# Patient Record
Sex: Male | Born: 1965 | Race: Black or African American | Hispanic: No | Marital: Single | Smoking: Current every day smoker
Health system: Southern US, Community
[De-identification: ages and names within clinical notes are randomized; demographics above are authoritative.]

## PROBLEM LIST (undated history)

## (undated) ENCOUNTER — Emergency Department (HOSPITAL_COMMUNITY): Discharge status: Against Medical Advice | Payer: Medicaid Other

## (undated) DIAGNOSIS — I10 Essential (primary) hypertension: Secondary | ICD-10-CM

## (undated) DIAGNOSIS — F329 Major depressive disorder, single episode, unspecified: Secondary | ICD-10-CM

## (undated) DIAGNOSIS — M549 Dorsalgia, unspecified: Secondary | ICD-10-CM

## (undated) DIAGNOSIS — Q211 Atrial septal defect: Secondary | ICD-10-CM

## (undated) DIAGNOSIS — F419 Anxiety disorder, unspecified: Secondary | ICD-10-CM

## (undated) DIAGNOSIS — K219 Gastro-esophageal reflux disease without esophagitis: Secondary | ICD-10-CM

## (undated) DIAGNOSIS — F32A Depression, unspecified: Secondary | ICD-10-CM

## (undated) DIAGNOSIS — I639 Cerebral infarction, unspecified: Secondary | ICD-10-CM

## (undated) DIAGNOSIS — Q2112 Patent foramen ovale: Secondary | ICD-10-CM

## (undated) HISTORY — DX: Major depressive disorder, single episode, unspecified: F32.9

## (undated) HISTORY — DX: Anxiety disorder, unspecified: F41.9

## (undated) HISTORY — DX: Gastro-esophageal reflux disease without esophagitis: K21.9

## (undated) HISTORY — DX: Depression, unspecified: F32.A

---

## 1989-07-31 HISTORY — PX: HAND SURGERY: SHX662

## 1998-01-15 ENCOUNTER — Emergency Department (HOSPITAL_COMMUNITY): Admission: EM | Admit: 1998-01-15 | Discharge: 1998-01-15 | Payer: Self-pay | Admitting: Emergency Medicine

## 1999-01-30 ENCOUNTER — Emergency Department (HOSPITAL_COMMUNITY): Admission: EM | Admit: 1999-01-30 | Discharge: 1999-01-31 | Payer: Self-pay | Admitting: *Deleted

## 2000-06-04 ENCOUNTER — Encounter: Payer: Self-pay | Admitting: Emergency Medicine

## 2000-06-04 ENCOUNTER — Emergency Department (HOSPITAL_COMMUNITY): Admission: EM | Admit: 2000-06-04 | Discharge: 2000-06-05 | Payer: Self-pay | Admitting: Emergency Medicine

## 2006-05-24 ENCOUNTER — Emergency Department (HOSPITAL_COMMUNITY): Admission: EM | Admit: 2006-05-24 | Discharge: 2006-05-24 | Payer: Self-pay | Admitting: Pediatrics

## 2006-05-27 ENCOUNTER — Emergency Department (HOSPITAL_COMMUNITY): Admission: EM | Admit: 2006-05-27 | Discharge: 2006-05-27 | Payer: Self-pay | Admitting: Emergency Medicine

## 2007-05-12 ENCOUNTER — Emergency Department (HOSPITAL_COMMUNITY): Admission: EM | Admit: 2007-05-12 | Discharge: 2007-05-13 | Payer: Self-pay | Admitting: Emergency Medicine

## 2008-12-14 ENCOUNTER — Emergency Department (HOSPITAL_COMMUNITY): Admission: EM | Admit: 2008-12-14 | Discharge: 2008-12-14 | Payer: Self-pay | Admitting: Emergency Medicine

## 2008-12-15 ENCOUNTER — Ambulatory Visit: Payer: Self-pay | Admitting: *Deleted

## 2009-07-23 ENCOUNTER — Emergency Department (HOSPITAL_COMMUNITY): Admission: EM | Admit: 2009-07-23 | Discharge: 2009-07-23 | Payer: Self-pay | Admitting: Emergency Medicine

## 2010-08-09 ENCOUNTER — Emergency Department (HOSPITAL_COMMUNITY)
Admission: EM | Admit: 2010-08-09 | Discharge: 2010-08-09 | Payer: Self-pay | Source: Home / Self Care | Admitting: Family Medicine

## 2010-11-08 LAB — RAPID STREP SCREEN (MED CTR MEBANE ONLY): Streptococcus, Group A Screen (Direct): NEGATIVE

## 2011-01-23 ENCOUNTER — Ambulatory Visit (HOSPITAL_COMMUNITY)
Admission: RE | Admit: 2011-01-23 | Discharge: 2011-01-23 | Disposition: A | Discharge status: Home / Self Care | Payer: Medicaid Other | Attending: Psychiatry | Admitting: Psychiatry

## 2011-01-23 ENCOUNTER — Emergency Department (HOSPITAL_COMMUNITY)
Admission: EM | Admit: 2011-01-23 | Discharge: 2011-01-24 | Disposition: A | Discharge status: Home / Self Care | Payer: Medicaid Other | Attending: Emergency Medicine | Admitting: Emergency Medicine

## 2011-01-23 DIAGNOSIS — F1410 Cocaine abuse, uncomplicated: Secondary | ICD-10-CM | POA: Insufficient documentation

## 2011-01-23 DIAGNOSIS — F1020 Alcohol dependence, uncomplicated: Secondary | ICD-10-CM | POA: Insufficient documentation

## 2011-01-23 DIAGNOSIS — F1010 Alcohol abuse, uncomplicated: Secondary | ICD-10-CM | POA: Insufficient documentation

## 2011-01-23 LAB — COMPREHENSIVE METABOLIC PANEL
ALT: 18 U/L (ref 0–53)
AST: 15 U/L (ref 0–37)
CO2: 26 mEq/L (ref 19–32)
Chloride: 101 mEq/L (ref 96–112)
GFR calc non Af Amer: >60 mL/min (ref >60)
Glucose, Bld: 74 mg/dL (ref 70–99)
Sodium: 138 mEq/L (ref 135–145)
Total Bilirubin: 0.5 mg/dL (ref 0.3–1.2)

## 2011-01-23 LAB — DIFFERENTIAL
Basophils Absolute: 0 10*3/uL (ref 0.0–0.1)
Basophils Relative: 0 % (ref 0–1)
Eosinophils Absolute: 0.1 10*3/uL (ref 0.0–0.7)
Eosinophils Relative: 1 % (ref 0–5)
Lymphocytes Relative: 38 % (ref 12–46)
Lymphs Abs: 2.9 10*3/uL (ref 0.7–4.0)
Monocytes Absolute: 0.9 10*3/uL (ref 0.1–1.0)
Monocytes Relative: 11 % (ref 3–12)
Neutro Abs: 3.9 10*3/uL (ref 1.7–7.7)
Neutrophils Relative %: 50 % (ref 43–77)

## 2011-01-23 LAB — CBC
HCT: 49.7 % (ref 39.0–52.0)
Hemoglobin: 16.9 g/dL (ref 13.0–17.0)
MCV: 82.3 fL (ref 78.0–100.0)
RBC: 6.04 MIL/uL — ABNORMAL HIGH (ref 4.22–5.81)
RDW: 13.4 % (ref 11.5–15.5)
WBC: 7.7 10*3/uL (ref 4.0–10.5)

## 2011-01-23 LAB — RAPID URINE DRUG SCREEN, HOSP PERFORMED
Amphetamines: NOT DETECTED
Barbiturates: NOT DETECTED
Benzodiazepines: NOT DETECTED
Cocaine: POSITIVE — AB

## 2011-01-23 LAB — URINALYSIS, ROUTINE W REFLEX MICROSCOPIC
Bilirubin Urine: NEGATIVE
Glucose, UA: NEGATIVE mg/dL
Ketones, ur: NEGATIVE mg/dL
Nitrite: NEGATIVE
Protein, ur: NEGATIVE mg/dL
pH: 6 (ref 5.0–8.0)

## 2011-01-23 LAB — URINE MICROSCOPIC-ADD ON

## 2011-03-29 ENCOUNTER — Emergency Department: Payer: Self-pay | Admitting: Emergency Medicine

## 2011-05-11 LAB — URINE MICROSCOPIC-ADD ON

## 2011-05-11 LAB — CBC
Hemoglobin: 15.7
RBC: 5.58
RDW: 13.4
WBC: 16.7 — ABNORMAL HIGH

## 2011-05-11 LAB — DIFFERENTIAL
Basophils Relative: 0
Eosinophils Absolute: 0.1
Monocytes Absolute: 2.4 — ABNORMAL HIGH
Monocytes Relative: 15 — ABNORMAL HIGH
Neutrophils Relative %: 70

## 2011-05-11 LAB — COMPREHENSIVE METABOLIC PANEL
ALT: 21
Alkaline Phosphatase: 71
CO2: 27
Glucose, Bld: 118 — ABNORMAL HIGH
Potassium: 3.9
Sodium: 136
Total Protein: 7

## 2011-05-11 LAB — URINALYSIS, ROUTINE W REFLEX MICROSCOPIC
Bilirubin Urine: NEGATIVE
Glucose, UA: NEGATIVE
Hgb urine dipstick: NEGATIVE
Ketones, ur: 15 — AB
Protein, ur: NEGATIVE

## 2011-12-05 ENCOUNTER — Encounter (HOSPITAL_COMMUNITY): Payer: Self-pay | Admitting: *Deleted

## 2011-12-05 ENCOUNTER — Emergency Department (HOSPITAL_COMMUNITY)
Admission: EM | Admit: 2011-12-05 | Discharge: 2011-12-05 | Disposition: A | Discharge status: Home / Self Care | Payer: Worker's Compensation | Attending: Emergency Medicine | Admitting: Emergency Medicine

## 2011-12-05 ENCOUNTER — Emergency Department (HOSPITAL_COMMUNITY): Payer: Worker's Compensation

## 2011-12-05 DIAGNOSIS — M79609 Pain in unspecified limb: Secondary | ICD-10-CM | POA: Insufficient documentation

## 2011-12-05 DIAGNOSIS — M545 Low back pain, unspecified: Secondary | ICD-10-CM | POA: Insufficient documentation

## 2011-12-05 DIAGNOSIS — M549 Dorsalgia, unspecified: Secondary | ICD-10-CM

## 2011-12-05 DIAGNOSIS — J45909 Unspecified asthma, uncomplicated: Secondary | ICD-10-CM | POA: Insufficient documentation

## 2011-12-05 DIAGNOSIS — M5380 Other specified dorsopathies, site unspecified: Secondary | ICD-10-CM | POA: Insufficient documentation

## 2011-12-05 MED ORDER — OXYCODONE-ACETAMINOPHEN 5-325 MG PO TABS
2.0000 | ORAL_TABLET | Freq: Once | ORAL | Status: AC
  Administered 2011-12-05: 2 via ORAL
  Filled 2011-12-05: qty 2

## 2011-12-05 MED ORDER — DIAZEPAM 5 MG PO TABS
5.0000 mg | ORAL_TABLET | Freq: Once | ORAL | Status: AC
  Administered 2011-12-05: 5 mg via ORAL
  Filled 2011-12-05: qty 1

## 2011-12-05 MED ORDER — IBUPROFEN 800 MG PO TABS: 800.0000 mg | ORAL_TABLET | Freq: Three times a day (TID) | ORAL | Status: DC

## 2011-12-05 MED ORDER — KETOROLAC TROMETHAMINE 60 MG/2ML IM SOLN
60.0000 mg | Freq: Once | INTRAMUSCULAR | Status: AC
  Administered 2011-12-05: 60 mg via INTRAMUSCULAR
  Filled 2011-12-05: qty 2

## 2011-12-05 MED ORDER — OXYCODONE-ACETAMINOPHEN 5-325 MG PO TABS: 1.0000 | ORAL_TABLET | Freq: Four times a day (QID) | ORAL | Status: AC | PRN

## 2011-12-05 MED ORDER — IBUPROFEN 800 MG PO TABS: 800.0000 mg | ORAL_TABLET | Freq: Three times a day (TID) | ORAL | Status: AC

## 2011-12-05 NOTE — Discharge Instructions (Signed)
Followup with orthopedics if symptoms continue. Use conservative methods at home including heat therapy and cold therapy as we discussed. More information on cold therapy is listed below.  It is not reccommended to use heat treatment directly after an acute injury. Do not operate machinery while on pain medication and take Ibuprofen with a full meal.   SEEK IMMEDIATE MEDICAL ATTENTION IF: New numbness, tingling, weakness, or problem with the use of your arms or legs.  Severe back pain not relieved with medications.  Change in bowel or bladder control.  Increasing pain in any areas of the body (such as chest or abdominal pain).  Shortness of breath, dizziness or fainting.  Nausea (feeling sick to your stomach), vomiting, fever, or sweats.  COLD THERAPY DIRECTIONS:  Ice or gel packs can be used to reduce both pain and swelling. Ice is the most helpful within the first 24 to 48 hours after an injury or flareup from overusing a muscle or joint.  Ice is effective, has very few side effects, and is safe for most people to use.   If you expose your skin to cold temperatures for too long or without the proper protection, you can damage your skin or nerves. Watch for signs of skin damage due to cold.   HOME CARE INSTRUCTIONS  Follow these tips to use ice and cold packs safely.  Place a dry or damp towel between the ice and skin. A damp towel will cool the skin more quickly, so you may need to shorten the time that the ice is used.  For a more rapid response, add gentle compression to the ice.  Ice for no more than 10 to 20 minutes at a time. The bonier the area you are icing, the less time it will take to get the benefits of ice.  Check your skin after 5 minutes to make sure there are no signs of a poor response to cold or skin damage.  Rest 20 minutes or more in between uses.  Once your skin is numb, you can end your treatment. You can test numbness by very lightly touching your skin. The touch should  be so light that you do not see the skin dimple from the pressure of your fingertip. When using ice, most people will feel these normal sensations in this order: cold, burning, aching, and numbness.  Do not use ice on someone who cannot communicate their responses to pain, such as small children or people with dementia.   HOW TO MAKE AN ICE PACK  To make an ice pack, do one of the following:  Place crushed ice or a bag of frozen vegetables in a sealable plastic bag. Squeeze out the excess air. Place this bag inside another plastic bag. Slide the bag into a pillowcase or place a damp towel between your skin and the bag.  Mix 3 parts water with 1 part rubbing alcohol. Freeze the mixture in a sealable plastic bag. When you remove the mixture from the freezer, it will be slushy. Squeeze out the excess air. Place this bag inside another plastic bag. Slide the bag into a pillowcase or place a damp towel between your skin and the bag.   SEEK MEDICAL CARE IF:  You develop white spots on your skin. This may give the skin a blotchy (mottled) appearance.  Your skin turns blue or pale.  Your skin becomes waxy or hard.  Your swelling gets worse.  MAKE SURE YOU:  Understand these instructions.  Will  watch your condition.  Will get help right away if you are not doing well or get worse.

## 2011-12-05 NOTE — ED Provider Notes (Signed)
History     CSN: [id number]  Arrival date & time 12/05/11  1559   First MD Initiated Contact with Patient 12/05/11 1810      Chief Complaint  Patient presents with  . Back Pain    (Consider location/radiation/quality/duration/timing/severity/associated sxs/prior treatment) HPI Comments: Patient is a 46 year old male with no significant medical history presents emergency department with a chief complaint of back pain.  Onset of symptoms occurred around 230 this afternoon, mechanism was bending over when patient felt sharp pain and was unable to straighten up, location in the lower lumbar region, pain characterized as back spasming with radiation down left leg.  Patient did not have a history of back pain current everyday smoker.  Patient denies any history of IV drug abuse or cancer.  Patient denies numbness or tingling of lower extremities, weakness, loss control of bowel or bladder or saddle paresthesias, fevers, nights sweats, or chills.   The history is provided by the patient.    Past Medical History  Diagnosis Date  . Asthma     History reviewed. No pertinent past surgical history.  No family history on file.  History  Substance Use Topics  . Smoking status: Current Everyday Smoker  . Smokeless tobacco: Not on file  . Alcohol Use: No      Review of Systems  All other systems reviewed and are negative.    Allergies  Barbiturates  Home Medications   Current Outpatient Rx  Name Route Sig Dispense Refill  . BC FAST PAIN RELIEF PO Oral Take 1 packet by mouth every 8 (eight) hours as needed. For pain      BP 122/69  Pulse 72  Temp(Src) 97.4 F (36.3 C) (Oral)  Resp 16  SpO2 97%  Physical Exam  Nursing note and vitals reviewed. Constitutional: He is oriented to person, place, and time. He appears well-developed and well-nourished. No distress.  HENT:  Head: Normocephalic and atraumatic.  Eyes: Conjunctivae and EOM are normal. Pupils are equal, round, and  reactive to light. No scleral icterus.  Neck: Normal range of motion and full passive range of motion without pain. Neck supple. No spinous process tenderness and no muscular tenderness present. No rigidity. Normal range of motion present. No Brudzinski's sign noted.  Cardiovascular: Normal rate, regular rhythm and intact distal pulses.  Exam reveals no gallop and no friction rub.   No murmur heard. Pulmonary/Chest: Effort normal and breath sounds normal. No respiratory distress. He has no wheezes. He has no rales. He exhibits no tenderness.  Musculoskeletal: He exhibits tenderness.       Cervical back: He exhibits normal range of motion, no tenderness, no bony tenderness and no pain.       Thoracic back: He exhibits no tenderness, no bony tenderness and no pain.       Lumbar back: He exhibits decreased range of motion, tenderness, bony tenderness, pain and spasm. He exhibits normal pulse.       Right foot: He exhibits no swelling.       Left foot: He exhibits no swelling.       Bilateral lower extremities nontender without color change, baseline range of motion of extremities with intact distal pulses, capillary refill less than 2 seconds bilaterally.  Pt has increased pain w ROM of lumbar spine. Pain w ambulation, no sign of ataxia.  Neurological: He is alert and oriented to person, place, and time. He has normal strength and normal reflexes. No sensory deficit. Gait (no ataxia, slowed and  hunched d/t pain ) abnormal.       Sensation at baseline for light touch in all 4 distal extremities, motor symmetric & bilateral 5/5  Abduction,adduction,flexion of hips, knee flexion & extension, foot dorsiflexion, foot plantar flexion.   Skin: Skin is warm and dry. No rash noted. He is not diaphoretic. No erythema. No pallor.  Psychiatric: He has a normal mood and affect.    ED Course  Procedures (including critical care time)  Labs Reviewed - No data to display No results found.   No diagnosis  found.    MDM  Back pain  Pt pain treated in ED w Torodol and Valium.  No neurological deficits and normal neuro exam.  Patient can walk but states is painful.  No loss of bowel or bladder control.  No concern for cauda equina.  No fever, night sweats, weight loss, h/o cancer, IVDU.  RICE protocol and pain medicine indicated and discussed with patient.          Jaci Carrel, [place] 12/05/11 1946

## 2011-12-05 NOTE — ED Notes (Signed)
Patient transported to CT 

## 2011-12-05 NOTE — ED Notes (Signed)
Pt states that he was at work and injured his back around 1400. States that he bent over at work to lift an object and had a sudden sharp stabbing pain when he stood up. Pain currently a 10 starts in back and radiates down to hips. No previous history of back injury.

## 2011-12-05 NOTE — ED Notes (Signed)
The pt  Has had lower back pain  Since he bent over at work to lift something and had a sudden sharp pain.  No previous history

## 2011-12-06 NOTE — ED Provider Notes (Signed)
Medical screening examination/treatment/procedure(s) were performed by non-physician practitioner and as supervising physician I was immediately available for consultation/collaboration.   Gordon Vandunk, MD 12/06/11 0028 

## 2012-01-30 ENCOUNTER — Emergency Department (HOSPITAL_COMMUNITY)
Admission: EM | Admit: 2012-01-30 | Discharge: 2012-01-30 | Disposition: A | Discharge status: Home / Self Care | Payer: Self-pay | Source: Home / Self Care | Attending: Emergency Medicine | Admitting: Emergency Medicine

## 2012-01-30 ENCOUNTER — Encounter (HOSPITAL_COMMUNITY): Payer: Self-pay | Admitting: *Deleted

## 2012-01-30 DIAGNOSIS — J45909 Unspecified asthma, uncomplicated: Secondary | ICD-10-CM

## 2012-01-30 MED ORDER — AZITHROMYCIN 250 MG PO TABS: ORAL_TABLET | ORAL | Status: AC

## 2012-01-30 MED ORDER — PREDNISONE 5 MG PO KIT: 1.0000 | PACK | Freq: Every day | ORAL | Status: DC

## 2012-01-30 MED ORDER — ALBUTEROL SULFATE HFA 108 (90 BASE) MCG/ACT IN AERS: 1.0000 | INHALATION_SPRAY | Freq: Four times a day (QID) | RESPIRATORY_TRACT | Status: DC | PRN

## 2012-01-30 MED ORDER — BENZONATATE 200 MG PO CAPS: 200.0000 mg | ORAL_CAPSULE | Freq: Three times a day (TID) | ORAL | Status: AC | PRN

## 2012-01-30 MED ORDER — TRAMADOL HCL 50 MG PO TABS: 100.0000 mg | ORAL_TABLET | Freq: Three times a day (TID) | ORAL | Status: AC | PRN

## 2012-01-30 NOTE — Discharge Instructions (Signed)
Most upper respiratory infections are caused by viruses and do not require antibiotics.  We try to save the antibiotics for when we really need them to avoid resistance.  This does not mean that there is nothing that can be done.  Here are a few hints about things that can be done at home to get over an upper respiratory infection quicker:  Get extra sleep and extra fluids.  Get 7 to 9 hours of sleep per night and 6 to 8 glasses of water a day.  Getting extra sleep keeps the immune system from getting run down.  Most people with an upper respiratory infection are a little dehydrated.  The extra fluids also keep the secretions liquified and easier to deal with.  Also, get extra vitamin C.  4000 mg per day is the recommended dose. For the aches, headache, and fever, acetaminophen or ibuprofen are helpful.  These can be alternated every 4 hours.  People with liver disease should avoid large amounts of acetaminophen, and people with ulcer disease, gastroesophageal reflux, gastritis, congestive heart failure, chronic kidney disease, coronary artery disease and the elderly should avoid ibuprofen. For nasal congestion try Mucinex-D, or if you're having lots of sneezing or copious clear nasal drainage Allegra-D-24 hour.  A Saline nasal spray such as Ocean Spray can also help as can decongestant sprays such as Afrin, but you should not use the decongestant sprays for more than 3 or 4 days since they can be habituating.  If nasal dryness is a problem, Ayr Nasal Gel can help moisturize your nasal passages.  Breath Rite nasal strips can also offer a non-drug alternative treatment to nasal congestion, especially at night. For people with symptoms of sinusitis, sleeping with your head elevated can be helpful.  For sinus pain, moist, hot compresses to the face may provide some relief.  Many people find that inhaling steam as in a shower or from a pot of steaming water can help. For sore throat, zinc containing lozenges such  as Cold-Eze or Zicam are helpful.  Zinc helps to fight infection and has a mild astringent effect that relieves the sore, achey throat.  Hot salt water gargles (8 oz of hot water, 1/2 tsp of table salt, and a pinch of baking soda) can give relief as well as hot beverages such as hot tea. For the cough, old time remedies such as honey or honey and lemon are tried and true.  Over the counter cough syrups such as Delsym 2 tsp every 12 hours can help as well.  It has also been found recently that Aleve can help control a cough.  The dose is 1 to 2 tablets twice daily with food.  This can be combined with Delsym. (Note, if you are taking ibuprofen, you should not take Aleve as well--take one or the other.)  It's important when you have an upper respiratory infection not to pass the infection to others.  This involves being very careful about the following:  Frequent hand washing or use of hand sanitizer, especially after coughing, sneezing, blowing your nose or touching your face, nose or eyes. Do not shake hands or touch anyone and try to avoid touching surfaces that other people use such as doorknobs, shopping carts, telephones and computer keyboards. Use tissues and dispose of them properly in a garbage can or ziplock bag. Cough into your sleeve. Do not let others eat or drink after you.  It's also important to recognize the signs of serious illness and  get evaluated if they occur: Any respiratory infection that lasts more than 7 to 10 days.  Yellow nasal drainage and sputum are not reliable indicators of a bacterial infection, but if they last for more than 1 week, see your doctor. Fever and sore throat can indicate strep. Fever and cough can indicate influenza or pneumonia. Any kind of severe symptom such as difficulty breathing, intractable vomiting, or severe pain should prompt you to see a doctor as soon as possible.   Your body's immune system is really the thing that will get rid of this  infection.  Your immune system is comprised of 2 types of specialized cells called T cells and B cells.  T cells coordinate the array of cells in your body that engulf invading bacteria or viruses while B cells orchestrate the production of antibodies that neutralize infection.  Anything we do or any medications we give you, will just strengthen your immune system or help it clear up the infection quicker.  Here are a few helpful hints to improve your immune system to help overcome this illness or to prevent future infections:  A few vitamins can improve the health of your immune system.  That's why your diet should include plenty of fruits, vegetables, fish, nuts, and whole grains.  Vitamin A and bet-carotene can increase the cells that fight infections (T cells and B cells).  Vitamin A is abundant in dark greens and orange vegetables such as spinach, greens, sweet potatoes, and carrots.  Vitamin B6 contributes to the maturation of white blood cells, the cells that fight disease.  Foods with vitamin B6 include cold cereal and bananas.  Vitamin C is credited with preventing colds because it increases white blood cells and also prevents cellular damage.  Citrus fruits, peaches and green and red bell peppers are all hight in vitamin C.  Vitamin E is an anti-oxidant that encourages the production of natural killer cells which reject foreign invaders and B cells that produce antibodies.  Foods high in vitamin E include wheat germ, nuts and seeds.  Foods high in omega-3 fatty acids found in foods like salmon, tuna and mackerel boost your immune system and help cells to engulf and absorb germs.  Probiotics are good bacteria that increase your T cells.  These can be found in yogurt and are available in supplements such as Culturelle or Align.  Moderate exercise increases the strength of your immune system and your ability to recover from illness.  I suggest 3 to 5 moderate intensity 30 minute workouts per  week.    Sleep is another component of maintaining a strong immune system.  It enables your body to recuperate from the day's activities, stress and work.  My recommendation is to get between 7 and 9 hours of sleep per night.  If you smoke, try to quit completely or at least cut down.  Drink alcohol only in moderation if at all.  No more than 2 drinks daily for men or 1 for women.  Get a flu vaccine early in the fall or if you have not gotten one yet, once this illness has run its course.  If you are over 65, a smoker, or an asthmatic, get a pneumococcal vaccine.  My final recommendation is to maintain a healthy weight.  Excess weight can impair the immune system by interfering with the way the immune system deals with invading viruses or bacteria.   How to Quit Smoking  According to the U.S. Surgeon General, about  440,000 people in the [place] alone die from complications related to tobacco use.  More deaths occur due to cigarette smoking than illegal drug use, AIDS, car accidents, alcohol-related deaths, suicide and homicide combined.  Smoking accounts for about 30% of all cancer related deaths, including more than 80% of lung cancer deaths. Smoking has also been linked as the cause of many other diseases like heart disease, bronchitis, emphysema, stroke, and complications of pneumonia as well as causing an increased risk of miscarriage, premature births, stillbirth, infant death, and low birth weight in infants.  For this reason, the U.S. Surgeon General recommends:  "Smoking cessation (stopping smoking) represents the single most important step that smokers can take to enhance the length and quality of their lives."  Why is it so hard to Quit?  Tobacco products contain Nicotine which is highly addictive - probably as addictive as heroin or cocaine.  Over time, your body becomes both physically and psychologically dependent on it. Finally, attempts to quit smoking are complicated by  withdrawal reactions like depression, irritability, trouble sleeping, trouble concentrating, restlessness, headaches, weight gain, and excessive fatigue as well as a lack of support.  These symptoms can last from a few days to several weeks.  Why should you Quit?  Live longer and healthier  Can improves the health of your housemates (children, spouse)  Increases your energy and breathing ability  Lowers risk of heart attack, stroke and cancer  Saves money - For example, if you smoke a pack of cigarettes a day and each pack costs about $3.00, then you will save about $1,100.00 per year, about $5,500. in 5 years and $11,000.00 in 10 years.  What you can do: 1. Talk to your health care provider - There are many smoking cessations aids available, both prescription or over-the-counter.  Check with your doctor and pharmacist before taking any of these products to see which one is best for you.  Develop a plan with your healthcare provider which may include nicotine replacement, prescription medication and/or counseling.  2. Get Started - Loraine Leriche a start date on your calendar.  Remove cigarettes and ashtrays from your home, car, and office.  Dont be around other smokers.  Stop smokingnot even a puff!  3. Support - Talk to family, friends, and co-workers about your plan to stop smoking.  Ask them not to smoke around you.  4. Coping Strategies  The four As to help during tough times:  a. Avoid - Avoid other smokers or places where smoking is commonplace.  Avoid alcoholic beverages as these may increase your desire to smoke b. Alter - Change your routine.  Drink water/juices instead of alcohol or coffee.  Take a walk or visit with someone during your coffee break.  Change your route to work. c. Alternatives - Substitute raw vegetables like carrot sticks or celery, sugarless candy or gum for the habit of having a cigarette. d. Activities - Start an exercise program (talk to your doctor prior to  beginning any exercise program). Try out a new hands on hobby to distract you from smoking and to keep your hands busy like woodworking or needlepoint.   Additional tips for specific withdrawal symptoms:   Cravings for tobacco:   Distract yourself        Deep-breathing exercises        Remember that cravings are brief    Irritability:    Take a few slow, deep breaths       Soak in a hot  bath    Insomnia:    Take a walk several hours before bedtime       Avoid caffeinated beverages after noon       Read a book       Take a warm bath       Banana or warm milk    Increased appetite:   Drink water or low-calorie drinks       Make a survival Kit: Include straws, cinnamon        sticks,        coffee stirrers, licorice, toothpicks, gum, or fresh         vegetables    Inability to concentrate:  Take a brisk walk       Lighten your schedule for a couple of days       Take more breaks   Fatigue:    Get a good nights sleep       Take a nap       Dont overdo it for 2-4 weeks    Constipation, gas,  stomach pain:    Drink plenty of fluids       Increase fiber: fruit, raw vegetables, whole grain        cereals       Talk to your doctor about diet changes    5. Dealing with Relapses - Most relapses occur within the first 2-3 months.  This is common so dont be discouraged.  Some people may take several attempts before they can quit smoking completely.  6. Reward yourself - Set-up a rewards program for every milestone, like 1st month after quitting, 3rd month after quitting, and 6th month after quitting to keep you motivated.  What your doctor can do: Perform a physical exam and order diagnostic tests like laboratory blood work and a chest X-ray.  This will help to identify health related conditions that might benefit from smoking cessation. Review your health history to make sure there are no contraindications with specific smoking cessation aids like allergies  to medications or ingredients in these medications or conflicts with your current medications. Prescribe smoking cessation aids such as: Over-the-counter aid:  Nicotine gum, Nicotine patch Prescription aids:  Nicotine spray, Nicotine inhaler, or Bupropion SR (non-nicotine). Offer or recommend individual or group counseling to support you during the initial quitting and maintenance phase of your smoking cessation program. Offer or recommend other alternative treatments like hypnosis or acupuncture.  What you can expect: Benefits from quitting smoking:  Improved Physical Appearance - Minimizes or stops premature wrinkling of skin, bad breath, stained teeth, gum disease, clothes/hair smoke odors, and yellow fingernails  Improved Daily Activities - Food tastes better, sense of smell improves, and decreases shortness of breath during ordinary activities like climbing stairs, walking, and performing light housework  Decreased Financial Cost - From both no longer purchasing cigarettes and the health care cost for medical treatment of conditions caused by smoking.  Health of Others - Decreases risk of exposing others to the effects of second hand smoke.  Sets an example for youth. Benefits of Quitting according to research from the U.S. Surgeon General:  20 minutes after:  Blood pressure lowers & Body temperature normalizes  8 hours after: Carbon monoxide levels begins to normalize   24 hours after: Heart attack risk decreases  2 weeks to 3 months after:  Blood flow improves & lung function increases   1 to 9 months after:  Coughing, sinus congestion, fatigue, shortness of breath decrease  1 year after: Risk of developing coronary heart disease is half that of a smoker  5 years after: Risk of a stroke decreases to that of a nonsmoker  10 years after: Risk of death due to lung cancer death is halved.  Risk of oral, throat, esophagus, bladder, kidney, and pancreatic cancer decreases.  15  years after: Risk of developing coronary heart disease is half that of a nonsmoker      References:  Here are references that can provide additional information and support:  American Cancer Society     American Heart Association (800) ACS-2345       (800) F1561943 or (800) 929-416-5224 www.cancer.org       www.amhrt.Water engineer Academy of Medical Acupuncture (231)093-1227 or 213-865-2081  (800228-420-3308 or 504-524-2453 www.lungusa.org       www.medicalacupuncture.org  National [employer] on Smoking & Health Cancer Information Service    Centers for Disease Control and Prevention (800) 4-CANCER or (800) Z5131811  340 394 5672 www.cancer.gov       UEarly.se  Nicotine Anonymous      Smokefree.gov 956-280-5872) TRY-NICA 319-004-6547)   225 390 5124) 44U-QUIT (905)785-5008) www.nicotine-anonymous.org   www.smokefree.gov  Contact your doctor or pharmacist if you have specific questions about starting a smoking cessation program.

## 2012-01-30 NOTE — ED Provider Notes (Signed)
Chief Complaint  Patient presents with  . Cough    History of Present Illness:   The patient is a 46 year old male with a two-week history of a cough productive of clear sputum. This all began with a cold with sore throat, nasal congestion, rhinorrhea, and fever and cold symptoms resolve, but he was left with a cough. He also has some wheezing and chest tightness. He has a history of mild, intermittent asthma and is a cigarette smoker. With a cough he's had some chest pain and some headache.  Review of Systems:  Other than noted above, the patient denies any of the following symptoms. Systemic:  No fever, chills, sweats, fatigue, myalgias, headache, or anorexia. Eye:  No redness, pain or drainage. ENT:  No earache, ear congestion, nasal congestion, sneezing, rhinorrhea, sinus pressure, sinus pain, post nasal drip, or sore throat. Lungs:  No cough, sputum production, wheezing, shortness of breath, or chest pain. GI:  No abdominal pain, nausea, vomiting, or diarrhea. Skin:  No rash or itching.  PMFSH:  Past medical history, family history, social history, meds, and allergies were reviewed.  Physical Exam:   Vital signs:  BP 141/84  Pulse 80  Temp 98.5 F (36.9 C) (Oral)  Resp 18  SpO2 100% General:  Alert, in no distress. Eye:  No conjunctival injection or drainage. Lids were normal. ENT:  TMs and canals were normal, without erythema or inflammation.  Nasal mucosa was clear and uncongested, without drainage.  Mucous membranes were moist.  Pharynx was clear, without exudate or drainage.  There were no oral ulcerations or lesions. Neck:  Supple, no adenopathy, tenderness or mass. Lungs:  No respiratory distress.  Lungs showed bilateral, scattered, inspiratory and expiratory wheezes, no rales or rhonchi.  Breath sounds were clear and equal bilaterally. Lungs were resonant to percussion.  No egophony. Heart:  Regular rhythm, without gallops, murmers or rubs. Skin:  Clear, warm, and dry,  without rash or lesions.  Assessment:  The encounter diagnosis was Asthmatic bronchitis.  Plan:   1.  The following meds were prescribed:   New Prescriptions   ALBUTEROL (PROVENTIL HFA;VENTOLIN HFA) 108 (90 BASE) MCG/ACT INHALER    Inhale 1-2 puffs into the lungs every 6 (six) hours as needed for wheezing.   AZITHROMYCIN (ZITHROMAX Z-PAK) 250 MG TABLET    Take as directed.   BENZONATATE (TESSALON) 200 MG CAPSULE    Take 1 capsule (200 mg total) by mouth 3 (three) times daily as needed for cough.   PREDNISONE 5 MG KIT    Take 1 kit (5 mg total) by mouth daily after breakfast. Prednisone 5 mg 6 day dosepack.  Take as directed.   TRAMADOL (ULTRAM) 50 MG TABLET    Take 2 tablets (100 mg total) by mouth every 8 (eight) hours as needed for pain.   2.  The patient was instructed in symptomatic care and handouts were given. 3.  The patient was told to return if becoming worse in any way, if no better in 3 or 4 days, and given some red flag symptoms that would indicate earlier return.   Reuben Likes, MD 01/30/12 [phone]

## 2012-01-30 NOTE — ED Notes (Signed)
Zachary Powers  Reports  He  Started  Off  sev  Weeks  Ago  With  Symptoms  Of a  Cough  /  Congestion      -  He  Reports  The  Cough  Has  Become  More  persistant   And  Is  For the  Most part  Non  Productive

## 2012-02-24 ENCOUNTER — Emergency Department (INDEPENDENT_AMBULATORY_CARE_PROVIDER_SITE_OTHER)
Admission: EM | Admit: 2012-02-24 | Discharge: 2012-02-24 | Disposition: A | Discharge status: Home / Self Care | Payer: Self-pay | Source: Home / Self Care | Attending: Emergency Medicine | Admitting: Emergency Medicine

## 2012-02-24 ENCOUNTER — Encounter (HOSPITAL_COMMUNITY): Payer: Self-pay | Admitting: Cardiology

## 2012-02-24 DIAGNOSIS — J029 Acute pharyngitis, unspecified: Secondary | ICD-10-CM

## 2012-02-24 LAB — POCT RAPID STREP A: Streptococcus, Group A Screen (Direct): NEGATIVE

## 2012-02-24 MED ORDER — AMOXICILLIN 500 MG PO CAPS: 500.0000 mg | ORAL_CAPSULE | Freq: Three times a day (TID) | ORAL | Status: AC

## 2012-02-24 NOTE — ED Notes (Signed)
Pt reports sore throat for the past 2 weeks. Some relief with aleve. Denies fever. Denies sinus drainage at this time.

## 2012-02-24 NOTE — ED Provider Notes (Signed)
History     CSN: [id number]  Arrival date & time 02/24/12  1251   First MD Initiated Contact with Patient 02/24/12 1325      Chief Complaint  Patient presents with  . Sore Throat    (Consider location/radiation/quality/duration/timing/severity/associated sxs/prior treatment) HPI Comments: Patient presents to urgent care this afternoon complaining of an ongoing sore throat for the last 2 weeks or so. Describes discomfort when he swallows he feels his throat is congested. He is able to swallow fluids or solids. Denies any fevers, or neck pain or headaches. Been taking some over-the-counter medicines. Describe some minimal nasal congestion but not all the time. Patient does admit that he continues to smoke but has displaced the amount of cigarettes per day since his been sick. Patient denies any shortness of breath, or cough. Also denies changes in appetite or myalgias or arthralgias.  Patient is a 46 y.o. male presenting with pharyngitis. The history is provided by the patient.  Sore Throat This is a new problem. The current episode started more than 1 week ago. The problem occurs constantly. The problem has not changed since onset.Pertinent negatives include no chest pain, no abdominal pain, no headaches and no shortness of breath. The symptoms are aggravated by swallowing. Nothing relieves the symptoms. He has tried nothing for the symptoms.    Past Medical History  Diagnosis Date  . Asthma     History reviewed. No pertinent past surgical history.  No family history on file.  History  Substance Use Topics  . Smoking status: Current Everyday Smoker  . Smokeless tobacco: Not on file  . Alcohol Use: No      Review of Systems  Constitutional: Negative for fever, chills, activity change, appetite change and fatigue.  HENT: Positive for sore throat. Negative for congestion, facial swelling, rhinorrhea, trouble swallowing, neck pain, neck stiffness and voice change.   Respiratory:  Negative for shortness of breath.   Cardiovascular: Negative for chest pain.  Gastrointestinal: Negative for abdominal pain.  Musculoskeletal: Negative for myalgias.  Neurological: Negative for dizziness and headaches.    Allergies  Barbiturates  Home Medications   Current Outpatient Rx  Name Route Sig Dispense Refill  . ALBUTEROL SULFATE HFA 108 (90 BASE) MCG/ACT IN AERS Inhalation Inhale 1-2 puffs into the lungs every 6 (six) hours as needed for wheezing. 1 Inhaler 0  . AMOXICILLIN 500 MG PO CAPS Oral Take 1 capsule (500 mg total) by mouth 3 (three) times daily. 30 capsule 0  . BC FAST PAIN RELIEF PO Oral Take 1 packet by mouth every 8 (eight) hours as needed. For pain    . PREDNISONE 5 MG PO KIT Oral Take 1 kit (5 mg total) by mouth daily after breakfast. Prednisone 5 mg 6 day dosepack.  Take as directed. 1 kit 0    BP 128/89  Pulse 67  Temp 97.4 F (36.3 C) (Oral)  Resp 16  SpO2 100%  Physical Exam  Nursing note and vitals reviewed. Constitutional: He appears well-developed and well-nourished.  HENT:  Head: Normocephalic.  Right Ear: Tympanic membrane normal.  Left Ear: Tympanic membrane normal.  Mouth/Throat: Uvula is midline and mucous membranes are normal. Posterior oropharyngeal erythema present. No oropharyngeal exudate, posterior oropharyngeal edema or tonsillar abscesses.  Eyes: Conjunctivae are normal.  Neck: Neck supple. No JVD present.  Cardiovascular: Normal rate.   Pulmonary/Chest: Effort normal.  Lymphadenopathy:    He has no cervical adenopathy.  Skin: No rash noted. No erythema.    ED  Course  Procedures (including critical care time)   Labs Reviewed  POCT RAPID STREP A ([facility] ONLY)   No results found.   1. Pharyngitis       MDM  Exudative pharyngitis. 2 weeks. No signs of a peritonsillar or pharyngeal abscess. Have started patient on a cycle of amoxicillin for 10 days. In symptomatic management with Tylenol or  Motrin.        Jimmie Molly, MD 02/24/12 1517

## 2013-02-20 ENCOUNTER — Other Ambulatory Visit: Payer: Self-pay | Admitting: Internal Medicine

## 2013-02-20 DIAGNOSIS — N882 Stricture and stenosis of cervix uteri: Secondary | ICD-10-CM

## 2013-02-25 ENCOUNTER — Encounter (HOSPITAL_COMMUNITY): Payer: Self-pay | Admitting: Emergency Medicine

## 2013-02-25 ENCOUNTER — Emergency Department (HOSPITAL_COMMUNITY)
Admission: EM | Admit: 2013-02-25 | Discharge: 2013-02-25 | Discharge status: Absent without leave | Payer: Medicaid Other | Source: Home / Self Care

## 2013-02-25 ENCOUNTER — Emergency Department (HOSPITAL_COMMUNITY)
Admission: EM | Admit: 2013-02-25 | Discharge: 2013-02-25 | Disposition: A | Discharge status: Home / Self Care | Payer: Medicaid Other | Source: Home / Self Care

## 2013-02-25 DIAGNOSIS — M5430 Sciatica, unspecified side: Secondary | ICD-10-CM

## 2013-02-25 HISTORY — DX: Dorsalgia, unspecified: M54.9

## 2013-02-25 MED ORDER — METAXALONE 800 MG PO TABS: 800.0000 mg | ORAL_TABLET | Freq: Three times a day (TID) | ORAL | Status: DC

## 2013-02-25 MED ORDER — IBUPROFEN 800 MG PO TABS: 800.0000 mg | ORAL_TABLET | Freq: Three times a day (TID) | ORAL | Status: DC

## 2013-02-25 MED ORDER — HYDROCODONE-ACETAMINOPHEN 5-325 MG PO TABS: 1.0000 | ORAL_TABLET | Freq: Four times a day (QID) | ORAL | Status: DC | PRN

## 2013-02-25 NOTE — ED Notes (Signed)
Call from pharmacy , as insurance will not cover skelaxin. Spoke w Dr Clementeen Graham, who authorized Xanaflex 40 mg, 1 tab q 6-8 H PRN muscle spasm, #21, no refill. Spoke directly w pharmacist, as pt is in store

## 2013-02-25 NOTE — ED Notes (Addendum)
Reports history of back pain for 2 years at least .  Denies any particular injury at onset.  Over the years, has back pain managed with aleve, but about one week ago started having increased pain and especially the last few days.  No recent injury.  Saw pcp last week and treated, but cannot recall names of medications.  Pain into back of both legs.  Pain in left lower back

## 2013-02-25 NOTE — ED Notes (Signed)
Chart review.

## 2013-02-25 NOTE — ED Provider Notes (Signed)
CSN: [id number]     Arrival date & time 02/25/13  1423 History     First MD Initiated Contact with Patient 02/25/13 1504     Chief Complaint  Patient presents with  . Back Pain   (Consider location/radiation/quality/duration/timing/severity/associated sxs/prior Treatment) HPI Comments: 47 year old male presents complaining of back pain that radiates into his bilateral thighs, down to the level of the upper calf. This started a couple of weeks ago and has gotten a lot worse in the past 2 days. When this first began, he saw his primary care physician who prescribed diclofenac and Zanaflex. He has been taking these as prescribed but they are not helping at all. He denies any inciting injury to his pain. He has experienced pain like this before in the past. He denies any numbness in the legs, loss of bowel or bladder function, fever, chills, rash, dysuria, pain in the groin.  Patient is a 48 y.o. male presenting with back pain.  Back Pain Associated symptoms: no abdominal pain, no chest pain, no dysuria, no fever and no weakness     Past Medical History  Diagnosis Date  . Asthma   . Back pain    Past Surgical History  Procedure Laterality Date  . Hand surgery Right    No family history on file. History  Substance Use Topics  . Smoking status: Current Every Day Smoker  . Smokeless tobacco: Not on file  . Alcohol Use: No    Review of Systems  Constitutional: Negative for fever, chills and fatigue.  HENT: Negative for sore throat, neck pain and neck stiffness.   Eyes: Negative for visual disturbance.  Respiratory: Negative for cough and shortness of breath.   Cardiovascular: Negative for chest pain, palpitations and leg swelling.  Gastrointestinal: Negative for nausea, vomiting, abdominal pain, diarrhea and constipation.  Genitourinary: Negative for dysuria, urgency, frequency, hematuria and difficulty urinating.  Musculoskeletal: Positive for back pain. Negative for myalgias and  arthralgias.  Skin: Negative for rash.  Neurological: Negative for dizziness, weakness and light-headedness.    Allergies  Barbiturates  Home Medications   Current Outpatient Rx  Name  Route  Sig  Dispense  Refill  . OVER THE COUNTER MEDICATION      Patient cannot remember what medicines he tried last week from pcp visit.  Called [facility] [address] [facility] and they reported: nicotine patches, aspirin, tramadol 50, diclofenac75, and zanaflex 4 mg.         Marland Kitchen EXPIRED: albuterol (PROVENTIL HFA;VENTOLIN HFA) 108 (90 BASE) MCG/ACT inhaler   Inhalation   Inhale 1-2 puffs into the lungs every 6 (six) hours as needed for wheezing.   1 Inhaler   0   . Aspirin-Caffeine (BC FAST PAIN RELIEF PO)   Oral   Take 1 packet by mouth every 8 (eight) hours as needed. For pain         . HYDROcodone-acetaminophen (NORCO) 5-325 MG per tablet   Oral   Take 1 tablet by mouth every 6 (six) hours as needed for pain.   15 tablet   0   . ibuprofen (ADVIL,MOTRIN) 800 MG tablet   Oral   Take 1 tablet (800 mg total) by mouth 3 (three) times daily.   60 tablet   0   . metaxalone (SKELAXIN) 800 MG tablet   Oral   Take 1 tablet (800 mg total) by mouth 3 (three) times daily.   21 tablet   0   . PredniSONE 5 MG KIT  Oral   Take 1 kit (5 mg total) by mouth daily after breakfast. Prednisone 5 mg 6 day dosepack.  Take as directed.   1 kit   0    BP 143/85  Pulse 77  Temp(Src) 97.6 F (36.4 C) (Oral)  Resp 16  SpO2 99% Physical Exam  Nursing note and vitals reviewed. Constitutional: He is oriented to person, place, and time. He appears well-developed and well-nourished. No distress.  HENT:  Head: Normocephalic and atraumatic.  Eyes: Conjunctivae and EOM are normal. Pupils are equal, round, and reactive to light.  Pulmonary/Chest: Effort normal.  Musculoskeletal:       Lumbar back: He exhibits decreased range of motion (Globally decreased due to patient's discomfort), tenderness  (bilateral paraspinous musculature), pain and spasm. He exhibits no bony tenderness, no swelling, no edema and no deformity.  Neurological: He is alert and oriented to person, place, and time. He displays normal reflexes. No cranial nerve deficit or sensory deficit. He exhibits normal muscle tone. Coordination normal.  Skin: Skin is warm and dry. No rash noted.  Psychiatric: He has a normal mood and affect. Judgment normal.    ED Course   Procedures (including critical care time)  Labs Reviewed - No data to display No results found. 1. Lumbago with sciatica, unspecified laterality     MDM  I will adjust this patient's treatment. If he still continues to worsen or does not improve he will followup here or with his primary care physician for further workup.   Meds ordered this encounter  Medications         . ibuprofen (ADVIL,MOTRIN) 800 MG tablet    Sig: Take 1 tablet (800 mg total) by mouth 3 (three) times daily.    Dispense:  60 tablet    Refill:  0  . metaxalone (SKELAXIN) 800 MG tablet    Sig: Take 1 tablet (800 mg total) by mouth 3 (three) times daily.    Dispense:  21 tablet    Refill:  0  . HYDROcodone-acetaminophen (NORCO) 5-325 MG per tablet    Sig: Take 1 tablet by mouth every 6 (six) hours as needed for pain.    Dispense:  15 tablet    Refill:  0     Graylon Good, PA-C 02/25/13 1537

## 2013-02-26 ENCOUNTER — Ambulatory Visit
Admission: RE | Admit: 2013-02-26 | Discharge: 2013-02-26 | Disposition: A | Discharge status: Home / Self Care | Payer: Medicaid Other | Source: Ambulatory Visit | Attending: Internal Medicine | Admitting: Internal Medicine

## 2013-02-26 DIAGNOSIS — N882 Stricture and stenosis of cervix uteri: Secondary | ICD-10-CM

## 2013-02-26 DIAGNOSIS — I6529 Occlusion and stenosis of unspecified carotid artery: Secondary | ICD-10-CM

## 2013-02-26 NOTE — ED Provider Notes (Signed)
Medical screening examination/treatment/procedure(s) were performed by a resident physician or non-physician practitioner and as the supervising physician I was immediately available for consultation/collaboration.  Clementeen Graham, MD   Rodolph Bong, MD 02/26/13 [phone]

## 2013-09-22 ENCOUNTER — Encounter (HOSPITAL_COMMUNITY): Payer: Self-pay | Admitting: Emergency Medicine

## 2013-09-22 ENCOUNTER — Emergency Department (HOSPITAL_COMMUNITY)
Admission: EM | Admit: 2013-09-22 | Discharge: 2013-09-22 | Disposition: A | Discharge status: Home / Self Care | Payer: Medicaid Other | Source: Home / Self Care | Attending: Emergency Medicine | Admitting: Emergency Medicine

## 2013-09-22 DIAGNOSIS — J111 Influenza due to unidentified influenza virus with other respiratory manifestations: Secondary | ICD-10-CM

## 2013-09-22 DIAGNOSIS — R69 Illness, unspecified: Principal | ICD-10-CM

## 2013-09-22 MED ORDER — DIPHENOXYLATE-ATROPINE 2.5-0.025 MG PO TABS: 1.0000 | ORAL_TABLET | Freq: Four times a day (QID) | ORAL | Status: DC | PRN

## 2013-09-22 MED ORDER — OSELTAMIVIR PHOSPHATE 75 MG PO CAPS: 75.0000 mg | ORAL_CAPSULE | Freq: Two times a day (BID) | ORAL | Status: DC

## 2013-09-22 MED ORDER — HYDROCODONE-ACETAMINOPHEN 5-325 MG PO TABS: ORAL_TABLET | ORAL | Status: DC

## 2013-09-22 MED ORDER — IPRATROPIUM BROMIDE 0.06 % NA SOLN: 2.0000 | Freq: Four times a day (QID) | NASAL | Status: DC

## 2013-09-22 MED ORDER — ALBUTEROL SULFATE HFA 108 (90 BASE) MCG/ACT IN AERS: 2.0000 | INHALATION_SPRAY | RESPIRATORY_TRACT | Status: DC | PRN

## 2013-09-22 NOTE — ED Notes (Signed)
C/o  Nonproductive cough.  Sneezing.  Chills.  Body aches and diarrhea.   Since yesterday.  Mild relief with otc meds.

## 2013-09-22 NOTE — ED Notes (Signed)
Zachary Powers, pharmacist from [facility] called.  Patient took 2 printed scripts to this location and wanted the three escribed medicines called to the [facility].  Gave 3 escribed meds verbally to the pharmacist.

## 2013-09-22 NOTE — ED Provider Notes (Signed)
  Chief Complaint   Chief Complaint  Patient presents with  . Influenza    History of Present Illness   Zachary Powers is a 48 year old male who has had a three-day history of generalized myalgias, chills, fatigue, subjective fever, sweats, diarrhea, slight dry cough, chest tightness, chest pain, nasal congestion with clear rhinorrhea, headache, and sinus pressure. He denies any significant sick exposures.  Review of Systems   Other than as noted above, the patient denies any of the following symptoms: Systemic:  No fevers, chills, sweats, or myalgias. Eye:  No redness or discharge. ENT:  No ear pain, headache, nasal congestion, drainage, sinus pressure, or sore throat. Neck:  No neck pain, stiffness, or swollen glands. Lungs:  No cough, sputum production, hemoptysis, wheezing, chest tightness, shortness of breath or chest pain. GI:  No abdominal pain, nausea, vomiting or diarrhea.  [place]   Past medical history, family history, social history, meds, and allergies were reviewed. He has a history of asthma, but has not been troubled by it for years.  Physical exam   Vital signs:  BP 143/91  Pulse 90  Temp(Src) 98.2 F (36.8 C) (Oral)  Resp 20  SpO2 100% General:  Alert and oriented.  In no distress.  Skin warm and dry. Eye:  No conjunctival injection or drainage. Lids were normal. ENT:  TMs and canals were normal, without erythema or inflammation.  Nasal mucosa was clear and uncongested, without drainage.  Mucous membranes were moist.  Pharynx was clear with no exudate or drainage.  There were no oral ulcerations or lesions. Neck:  Supple, no adenopathy, tenderness or mass. Lungs:  No respiratory distress.  Lungs were clear to auscultation, without wheezes, rales or rhonchi.  Breath sounds were clear and equal bilaterally.  Heart:  Regular rhythm, without gallops, murmers or rubs. Skin:  Clear, warm, and dry, without rash or lesions.  Assessment     The encounter diagnosis was  Influenza-like illness.  Plan    1.  Meds:  The following meds were prescribed:   Discharge Medication List as of 09/22/2013  9:04 AM    START taking these medications   Details  diphenoxylate-atropine (LOMOTIL) 2.5-0.025 MG per tablet Take 1 tablet by mouth 4 (four) times daily as needed for diarrhea or loose stools., Starting 09/22/2013, Until Discontinued, Print    !! HYDROcodone-acetaminophen (NORCO/VICODIN) 5-325 MG per tablet 1 to 2 tabs every 4 to 6 hours as needed for pain., Print    ipratropium (ATROVENT) 0.06 % nasal spray Place 2 sprays into both nostrils 4 (four) times daily., Starting 09/22/2013, Until Discontinued, Normal    oseltamivir (TAMIFLU) 75 MG capsule Take 1 capsule (75 mg total) by mouth every 12 (twelve) hours., Starting 09/22/2013, Until Discontinued, Normal     !! - Potential duplicate medications found. Please discuss with provider.      2.  Patient Education/Counseling:  The patient was given appropriate handouts, self care instructions, and instructed in symptomatic relief.  Instructed to get extra fluids, rest, and use a cool mist vaporizer.    3.  Follow up:  The patient was told to follow up here if no better in 3 to 4 days, or sooner if becoming worse in any way, and given some red flag symptoms such as increasing fever, difficulty breathing, chest pain, or persistent vomiting which would prompt immediate return.  Follow up here as needed.      Harden Mo, MD 09/22/13 [phone]

## 2013-09-22 NOTE — Discharge Instructions (Signed)
Most upper respiratory infections are caused by viruses and do not require antibiotics.  We try to save the antibiotics for when we really need them to prevent bacteria from developing resistance to them.  Here are a few hints about things that can be done at home to help get over an upper respiratory infection quicker: ° °Get extra sleep and extra fluids.  Get 7 to 9 hours of sleep per night and 6 to 8 glasses of water a day.  Getting extra sleep keeps the immune system from getting run down.  Most people with an upper respiratory infection are a little dehydrated.  The extra fluids also keep the secretions liquified and easier to deal with.  Also, get extra vitamin C.  4000 mg per day is the recommended dose. °For the aches, headache, and fever, acetaminophen or ibuprofen are helpful.  These can be alternated every 4 hours.  People with liver disease should avoid large amounts of acetaminophen, and people with ulcer disease, gastroesophageal reflux, gastritis, congestive heart failure, chronic kidney disease, coronary artery disease and the elderly should avoid ibuprofen. °For nasal congestion try Mucinex-D, or if you're having lots of sneezing or clear nasal drainage use Zyrtec-D. People with high blood pressure can take these if their blood pressure is controlled, if not, it's best to avoid the forms with a "D" (decongestants).  You can use the plain Mucinex, Allegra, Claritin, or Zyrtec even if your blood pressure is not controlled.   °A Saline nasal spray such as Ocean Spray can also help.  You can add a decongestant sprays such as Afrin, but you should not use the decongestant sprays for more than 3 or 4 days since they can be habituating.  Breathe Rite nasal strips can also offer a non-drug alternative treatment to nasal congestion, especially at night. °For people with symptoms of sinusitis, sleeping with your head elevated can be helpful.  For sinus pain, moist, hot compresses to the face may provide some  relief.  Many people find that inhaling steam as in a shower or from a pot of steaming water can help. °For any viral infection, zinc containing lozenges such as Cold-Eze or Zicam are helpful.  Zinc helps to fight viral infection.  Hot salt water gargles (8 oz of hot water, 1/2 tsp of table salt, and a pinch of baking soda) can give relief as well as hot beverages such as hot tea.  Sucrets extra strength lozenges will help the sore throat.  °For the cough, take Delsym 2 tsp every 12 hours.  It has also been found recently that Aleve can help control a cough.  The dose is 1 to 2 tablets twice daily with food.  This can be combined with Delsym. (Note, if you are taking ibuprofen, you should not take Aleve as well--take one or the other.) °A cool mist vaporizer will help keep your mucous membranes from drying out.  ° °It's important when you have an upper respiratory infection not to pass the infection to others.  This involves being very careful about the following: ° °Frequent hand washing or use of hand sanitizer, especially after coughing, sneezing, blowing your nose or touching your face, nose or eyes. °Do not shake hands or touch anyone and try to avoid touching surfaces that other people use such as doorknobs, shopping carts, telephones and computer keyboards. °Use tissues and dispose of them properly in a garbage can or ziplock bag. °Cough into your sleeve. °Do not let others eat or   drink after you. ° °It's also important to recognize the signs of serious illness and get evaluated if they occur: °Any respiratory infection that lasts more than 7 to 10 days.  Yellow nasal drainage and sputum are not reliable indicators of a bacterial infection, but if they last for more than 1 week, see your doctor. °Fever and sore throat can indicate strep. °Fever and cough can indicate influenza or pneumonia. °Any kind of severe symptom such as difficulty breathing, intractable vomiting, or severe pain should prompt you to see  a doctor as soon as possible. ° ° °Your body's immune system is really the thing that will get rid of this infection.  Your immune system is comprised of 2 types of specialized cells called T cells and B cells.  T cells coordinate the array of cells in your body that engulf invading bacteria or viruses while B cells orchestrate the production of antibodies that neutralize infection.  Anything we do or any medications we give you, will just strengthen your immune system or help it clear up the infection quicker.  Here are a few helpful hints to improve your immune system to help overcome this illness or to prevent future infections: °· A few vitamins can improve the health of your immune system.  That's why your diet should include plenty of fruits, vegetables, fish, nuts, and whole grains. °· Vitamin A and bet-carotene can increase the cells that fight infections (T cells and B cells).  Vitamin A is abundant in dark greens and orange vegetables such as spinach, greens, sweet potatoes, and carrots. °· Vitamin B6 contributes to the maturation of white blood cells, the cells that fight disease.  Foods with vitamin B6 include cold cereal and bananas. °· Vitamin C is credited with preventing colds because it increases white blood cells and also prevents cellular damage.  Citrus fruits, peaches and green and red bell peppers are all hight in vitamin C. °· Vitamin E is an anti-oxidant that encourages the production of natural killer cells which reject foreign invaders and B cells that produce antibodies.  Foods high in vitamin E include wheat germ, nuts and seeds. °· Foods high in omega-3 fatty acids found in foods like salmon, tuna and mackerel boost your immune system and help cells to engulf and absorb germs. °· Probiotics are good bacteria that increase your T cells.  These can be found in yogurt and are available in supplements such as Culturelle or Align. °· Moderate exercise increases the strength of your immune  system and your ability to recover from illness.  I suggest 3 to 5 moderate intensity 30 minute workouts per week.   °· Sleep is another component of maintaining a strong immune system.  It enables your body to recuperate from the day's activities, stress and work.  My recommendation is to get between 7 and 9 hours of sleep per night. °· If you smoke, try to quit completely or at least cut down.  Drink alcohol only in moderation if at all.  No more than 2 drinks daily for men or 1 for women. °· Get a flu vaccine early in the fall or if you have not gotten one yet, once this illness has run its course.  If you are over 65, a smoker, or an asthmatic, get a pneumococcal vaccine. °· My final recommendation is to maintain a healthy weight.  Excess weight can impair the immune system by interfering with the way the immune system deals with invading viruses or   bacteria. ° ° ° °Influenza, Adult °Influenza ("the flu") is a viral infection of the respiratory tract. It occurs more often in winter months because people spend more time in close contact with one another. Influenza can make you feel very sick. Influenza easily spreads from person to person (contagious). °CAUSES  °Influenza is caused by a virus that infects the respiratory tract. You can catch the virus by breathing in droplets from an infected person's cough or sneeze. You can also catch the virus by touching something that was recently contaminated with the virus and then touching your mouth, nose, or eyes. °SYMPTOMS  °Symptoms typically last 4 to 10 days and may include: °· Fever. °· Chills. °· Headache, body aches, and muscle aches. °· Sore throat. °· Chest discomfort and cough. °· Poor appetite. °· Weakness or feeling tired. °· Dizziness. °· Nausea or vomiting. °DIAGNOSIS  °Diagnosis of influenza is often made based on your history and a physical exam. A nose or throat swab test can be done to confirm the diagnosis. °RISKS AND COMPLICATIONS °You may be at risk  for a more severe case of influenza if you smoke cigarettes, have diabetes, have chronic heart disease (such as heart failure) or lung disease (such as asthma), or if you have a weakened immune system. Elderly people and pregnant women are also at risk for more serious infections. The most common complication of influenza is a lung infection (pneumonia). Sometimes, this complication can require emergency medical care and may be life-threatening. °PREVENTION  °An annual influenza vaccination (flu shot) is the best way to avoid getting influenza. An annual flu shot is now routinely recommended for all adults in the [place] °TREATMENT  °In mild cases, influenza goes away on its own. Treatment is directed at relieving symptoms. For more severe cases, your caregiver may prescribe antiviral medicines to shorten the sickness. Antibiotic medicines are not effective, because the infection is caused by a virus, not by bacteria. °HOME CARE INSTRUCTIONS °· Only take over-the-counter or prescription medicines for pain, discomfort, or fever as directed by your caregiver. °· Use a cool mist humidifier to make breathing easier. °· Get plenty of rest until your temperature returns to normal. This usually takes 3 to 4 days. °· Drink enough fluids to keep your urine clear or pale yellow. °· Cover your mouth and nose when coughing or sneezing, and wash your hands well to avoid spreading the virus. °· Stay home from work or school until your fever has been gone for at least 1 full day. °SEEK MEDICAL CARE IF:  °· You have chest pain or a deep cough that worsens or produces more mucus. °· You have nausea, vomiting, or diarrhea. °SEEK IMMEDIATE MEDICAL CARE IF:  °· You have difficulty breathing, shortness of breath, or your skin or nails turn bluish. °· You have severe neck pain or stiffness. °· You have a severe headache, facial pain, or earache. °· You have a worsening or recurring fever. °· You have nausea or vomiting that cannot be  controlled. °MAKE SURE YOU: °· Understand these instructions. °· Will watch your condition. °· Will get help right away if you are not doing well or get worse. °Document Released: 07/14/2000 Document Revised: 01/16/2012 Document Reviewed: 10/16/2011 °[employer]® Patient Information ©2014 [employer]. ° °

## 2013-12-17 ENCOUNTER — Other Ambulatory Visit: Payer: Self-pay | Admitting: Otolaryngology

## 2013-12-17 DIAGNOSIS — IMO0001 Reserved for inherently not codable concepts without codable children: Secondary | ICD-10-CM

## 2013-12-17 DIAGNOSIS — H918X2 Other specified hearing loss, left ear: Secondary | ICD-10-CM

## 2013-12-26 ENCOUNTER — Ambulatory Visit
Admission: RE | Admit: 2013-12-26 | Discharge: 2013-12-26 | Disposition: A | Discharge status: Home / Self Care | Payer: Medicaid Other | Source: Ambulatory Visit | Attending: Otolaryngology | Admitting: Otolaryngology

## 2013-12-26 DIAGNOSIS — IMO0001 Reserved for inherently not codable concepts without codable children: Secondary | ICD-10-CM

## 2013-12-26 DIAGNOSIS — H918X2 Other specified hearing loss, left ear: Secondary | ICD-10-CM

## 2013-12-26 MED ORDER — GADOBENATE DIMEGLUMINE 529 MG/ML IV SOLN
18.0000 mL | Freq: Once | INTRAVENOUS | Status: AC | PRN
  Administered 2013-12-26: 18 mL via INTRAVENOUS

## 2014-04-03 ENCOUNTER — Emergency Department (INDEPENDENT_AMBULATORY_CARE_PROVIDER_SITE_OTHER)
Admission: EM | Admit: 2014-04-03 | Discharge: 2014-04-03 | Disposition: A | Discharge status: Home / Self Care | Payer: Self-pay | Source: Home / Self Care | Attending: Family Medicine | Admitting: Family Medicine

## 2014-04-03 ENCOUNTER — Encounter (HOSPITAL_COMMUNITY): Payer: Self-pay | Admitting: Emergency Medicine

## 2014-04-03 DIAGNOSIS — M5431 Sciatica, right side: Secondary | ICD-10-CM

## 2014-04-03 DIAGNOSIS — M5430 Sciatica, unspecified side: Secondary | ICD-10-CM

## 2014-04-03 DIAGNOSIS — M62830 Muscle spasm of back: Secondary | ICD-10-CM

## 2014-04-03 DIAGNOSIS — M533 Sacrococcygeal disorders, not elsewhere classified: Secondary | ICD-10-CM

## 2014-04-03 DIAGNOSIS — M5380 Other specified dorsopathies, site unspecified: Secondary | ICD-10-CM

## 2014-04-03 DIAGNOSIS — D179 Benign lipomatous neoplasm, unspecified: Secondary | ICD-10-CM

## 2014-04-03 MED ORDER — FAMOTIDINE 20 MG PO TABS
40.0000 mg | ORAL_TABLET | Freq: Once | ORAL | Status: AC
  Administered 2014-04-03: 40 mg via ORAL

## 2014-04-03 MED ORDER — MORPHINE SULFATE 2 MG/ML IJ SOLN
INTRAMUSCULAR | Status: AC
  Filled 2014-04-03: qty 1

## 2014-04-03 MED ORDER — FAMOTIDINE 20 MG PO TABS
ORAL_TABLET | ORAL | Status: AC
  Filled 2014-04-03: qty 2

## 2014-04-03 MED ORDER — MORPHINE SULFATE 2 MG/ML IJ SOLN: 1.0000 mg | Freq: Once | INTRAMUSCULAR | Status: DC

## 2014-04-03 MED ORDER — METHYLPREDNISOLONE SODIUM SUCC 125 MG IJ SOLR
80.0000 mg | Freq: Once | INTRAMUSCULAR | Status: AC
  Administered 2014-04-03: 80 mg via INTRAMUSCULAR

## 2014-04-03 MED ORDER — METHYLPREDNISOLONE SODIUM SUCC 125 MG IJ SOLR
INTRAMUSCULAR | Status: AC
  Filled 2014-04-03: qty 2

## 2014-04-03 MED ORDER — PREDNISONE (PAK) 10 MG PO TABS: ORAL_TABLET | Freq: Every day | ORAL | Status: DC

## 2014-04-03 MED ORDER — KETOROLAC TROMETHAMINE 60 MG/2ML IM SOLN
INTRAMUSCULAR | Status: AC
  Filled 2014-04-03: qty 2

## 2014-04-03 MED ORDER — CYCLOBENZAPRINE HCL 5 MG PO TABS: 5.0000 mg | ORAL_TABLET | Freq: Three times a day (TID) | ORAL | Status: DC | PRN

## 2014-04-03 MED ORDER — KETOROLAC TROMETHAMINE 60 MG/2ML IM SOLN
60.0000 mg | Freq: Once | INTRAMUSCULAR | Status: AC
Start: 2014-04-03 — End: 2014-04-03
  Administered 2014-04-03: 60 mg via INTRAMUSCULAR

## 2014-04-03 NOTE — ED Notes (Signed)
Pt is driving and male friend here w/him does not have license to drive; notified Dr. Marily Memos Cancel morphine and change to Toradol.

## 2014-04-03 NOTE — ED Provider Notes (Addendum)
CSN: [id number]     Arrival date & time 04/03/14  1718 History   First MD Initiated Contact with Patient 04/03/14 1746     Chief Complaint  Patient presents with  . Back Pain   (Consider location/radiation/quality/duration/timing/severity/associated sxs/prior Treatment) HPI  Back pain: chronic condition for pt. Flares from time to time. Typically relieved w/ tramadol and naproxen. Started 5 days ago. Unsure of what triggered the episode. Lower R back. Occasionally shots down R buttock and posterior thigh. Naproxen adn tramadol this time w/o benefit. Denies any loss of bowel or bladder function, numbness or loss of strength in LE. No falls. No trauma. Worse w/ walking, sitting adn turning to the R. Getting worse.   AFter further discussion pt admitted to starting wt lifting again about 7-10 days ago and doing a lot of heavy wts such as bench adn squats   Past Medical History  Diagnosis Date  . Asthma   . Back pain    Past Surgical History  Procedure Laterality Date  . Hand surgery Right    No family history on file. History  Substance Use Topics  . Smoking status: Current Every Day Smoker  . Smokeless tobacco: Not on file  . Alcohol Use: No    Review of Systems Per HPI with all other pertinent systems negative.   Allergies  Barbiturates  Home Medications   Prior to Admission medications   Medication Sig Start Date End Date Taking? Authorizing Provider  albuterol (PROVENTIL HFA;VENTOLIN HFA) 108 (90 BASE) MCG/ACT inhaler Inhale 1-2 puffs into the lungs every 6 (six) hours as needed for wheezing. 01/30/12 01/29/13  Harden Mo, MD  albuterol (PROVENTIL HFA;VENTOLIN HFA) 108 (90 BASE) MCG/ACT inhaler Inhale 2 puffs into the lungs every 4 (four) hours as needed for wheezing or shortness of breath. 09/22/13   Harden Mo, MD  Aspirin-Caffeine (BC FAST PAIN RELIEF PO) Take 1 packet by mouth every 8 (eight) hours as needed. For pain    Historical Provider, MD  cyclobenzaprine  (FLEXERIL) 5 MG tablet Take 1-2 tablets (5-10 mg total) by mouth 3 (three) times daily as needed for muscle spasms. 04/03/14   Waldemar Dickens, MD  diphenoxylate-atropine (LOMOTIL) 2.5-0.025 MG per tablet Take 1 tablet by mouth 4 (four) times daily as needed for diarrhea or loose stools. 09/22/13   Harden Mo, MD  HYDROcodone-acetaminophen (NORCO) 5-325 MG per tablet Take 1 tablet by mouth every 6 (six) hours as needed for pain. 02/25/13   Liam Graham, PA-C  HYDROcodone-acetaminophen (NORCO/VICODIN) 5-325 MG per tablet 1 to 2 tabs every 4 to 6 hours as needed for pain. 09/22/13   Harden Mo, MD  ibuprofen (ADVIL,MOTRIN) 800 MG tablet Take 1 tablet (800 mg total) by mouth 3 (three) times daily. 02/25/13   Freeman Caldron Baker, PA-C  ipratropium (ATROVENT) 0.06 % nasal spray Place 2 sprays into both nostrils 4 (four) times daily. 09/22/13   Harden Mo, MD  metaxalone (SKELAXIN) 800 MG tablet Take 1 tablet (800 mg total) by mouth 3 (three) times daily. 02/25/13   Liam Graham, PA-C  oseltamivir (TAMIFLU) 75 MG capsule Take 1 capsule (75 mg total) by mouth every 12 (twelve) hours. 09/22/13   Harden Mo, MD  OVER THE COUNTER MEDICATION Patient cannot remember what medicines he tried last week from pcp visit.  Called [facility] [address] [facility] and they reported: nicotine patches, aspirin, tramadol 50, diclofenac75, and zanaflex 4 mg.    Historical Provider,  MD  predniSONE (STERAPRED UNI-PAK) 10 MG tablet Take by mouth daily. Take 5 x 5 days 4 x 2 days, 3 x 2 days, 2 x 2 days, 1 x 2 days 04/03/14   Waldemar Dickens, MD  PredniSONE 5 MG KIT Take 1 kit (5 mg total) by mouth daily after breakfast. Prednisone 5 mg 6 day dosepack.  Take as directed. 01/30/12   Harden Mo, MD   BP 149/82  Pulse 98  Temp(Src) 98 F (36.7 C) (Oral)  Resp 14  SpO2 98% Physical Exam  Constitutional: He appears well-developed and well-nourished. No distress.  HENT:  Head: Normocephalic and atraumatic.  Eyes: EOM  are normal. Pupils are equal, round, and reactive to light.  Neck: Normal range of motion.  Cardiovascular: Normal rate.   Pulmonary/Chest: Effort normal. No respiratory distress.  Abdominal: Soft.  Musculoskeletal:  Back w/ FROm but slow to move due to pain.  R lumbar perispinal muscle tightness and ttp. FABER's + on R. Strength 5/5 bilat in LE w/ all movement  Skin: He is not diaphoretic.    ED Course  Procedures (including critical care time) Labs Review Labs Reviewed - No data to display  Imaging Review No results found.   MDM   1. Back spasm   2. Sciatica, right   3. Lipoma   4. Sacroiliac joint pain    Solumedrol 87m IM in office. Toradol 623mIM (due to pts significant pain, aware of reflux so will also give Pepcid 4050mO)  Start prednisone dose pack (GI intolerance to NSAIDs) Flexeril Start exercises and discussed avoiding triggers and minimizing strenuous exercises.   Lipoma: small and no need for further owrkup unless becomes painful  Precautions given and all questions answered  DavLinna DarnerD [facility] 04/03/2014, 6:19 PM      DavWaldemar DickensD 04/03/14 1819  DavWaldemar DickensD 04/03/14 [id number]

## 2014-04-03 NOTE — Discharge Instructions (Signed)
Your back pain is most likely from strain from increasing your weight lifting too quickly This is causing some mild irritation of the sciatic nerve Please start the steroids and the muscle relaxer The muscle relaxer may make you tired Please start the exercises Consider ice or heat for your back and gentle massage Remember to rest but also stay active this weekend.   Sciatica with Rehab The sciatic nerve runs from the back down the leg and is responsible for sensation and control of the muscles in the back (posterior) side of the thigh, lower leg, and foot. Sciatica is a condition that is characterized by inflammation of this nerve.  SYMPTOMS   Signs of nerve damage, including numbness and/or weakness along the posterior side of the lower extremity.  Pain in the back of the thigh that may also travel down the leg.  Pain that worsens when sitting for long periods of time.  Occasionally, pain in the back or buttock. CAUSES  Inflammation of the sciatic nerve is the cause of sciatica. The inflammation is due to something irritating the nerve. Common sources of irritation include:  Sitting for long periods of time.  Direct trauma to the nerve.  Arthritis of the spine.  Herniated or ruptured disk.  Slipping of the vertebrae (spondylolisthesis).  Pressure from soft tissues, such as muscles or ligament-like tissue (fascia). RISK INCREASES WITH:  Sports that place pressure or stress on the spine (football or weightlifting).  Poor strength and flexibility.  Failure to warm up properly before activity.  Family history of low back pain or disk disorders.  Previous back injury or surgery.  Poor body mechanics, especially when lifting, or poor posture. PREVENTION   Warm up and stretch properly before activity.  Maintain physical fitness:  Strength, flexibility, and endurance.  Cardiovascular fitness.  Learn and use proper technique, especially with posture and lifting. When  possible, have coach correct improper technique.  Avoid activities that place stress on the spine. PROGNOSIS If treated properly, then sciatica usually resolves within 6 weeks. However, occasionally surgery is necessary.  RELATED COMPLICATIONS   Permanent nerve damage, including pain, numbness, tingle, or weakness.  Chronic back pain.  Risks of surgery: infection, bleeding, nerve damage, or damage to surrounding tissues. TREATMENT Treatment initially involves resting from any activities that aggravate your symptoms. The use of ice and medication may help reduce pain and inflammation. The use of strengthening and stretching exercises may help reduce pain with activity. These exercises may be performed at home or with referral to a therapist. A therapist may recommend further treatments, such as transcutaneous electronic nerve stimulation (TENS) or ultrasound. Your caregiver may recommend corticosteroid injections to help reduce inflammation of the sciatic nerve. If symptoms persist despite non-surgical (conservative) treatment, then surgery may be recommended. MEDICATION  If pain medication is necessary, then nonsteroidal anti-inflammatory medications, such as aspirin and ibuprofen, or other minor pain relievers, such as acetaminophen, are often recommended.  Do not take pain medication for 7 days before surgery.  Prescription pain relievers may be given if deemed necessary by your caregiver. Use only as directed and only as much as you need.  Ointments applied to the skin may be helpful.  Corticosteroid injections may be given by your caregiver. These injections should be reserved for the most serious cases, because they may only be given a certain number of times. HEAT AND COLD  Cold treatment (icing) relieves pain and reduces inflammation. Cold treatment should be applied for 10 to 15 minutes every  2 to 3 hours for inflammation and pain and immediately after any activity that aggravates  your symptoms. Use ice packs or massage the area with a piece of ice (ice massage).  Heat treatment may be used prior to performing the stretching and strengthening activities prescribed by your caregiver, physical therapist, or athletic trainer. Use a heat pack or soak the injury in warm water. SEEK MEDICAL CARE IF:  Treatment seems to offer no benefit, or the condition worsens.  Any medications produce adverse side effects. EXERCISES  RANGE OF MOTION (ROM) AND STRETCHING EXERCISES - Sciatica Most people with sciatic will find that their symptoms worsen with either excessive bending forward (flexion) or arching at the low back (extension). The exercises which will help resolve your symptoms will focus on the opposite motion. Your physician, physical therapist or athletic trainer will help you determine which exercises will be most helpful to resolve your low back pain. Do not complete any exercises without first consulting with your clinician. Discontinue any exercises which worsen your symptoms until you speak to your clinician. If you have pain, numbness or tingling which travels down into your buttocks, leg or foot, the goal of the therapy is for these symptoms to move closer to your back and eventually resolve. Occasionally, these leg symptoms will get better, but your low back pain may worsen; this is typically an indication of progress in your rehabilitation. Be certain to be very alert to any changes in your symptoms and the activities in which you participated in the 24 hours prior to the change. Sharing this information with your clinician will allow him/her to most efficiently treat your condition. These exercises may help you when beginning to rehabilitate your injury. Your symptoms may resolve with or without further involvement from your physician, physical therapist or athletic trainer. While completing these exercises, remember:   Restoring tissue flexibility helps normal motion to  return to the joints. This allows healthier, less painful movement and activity.  An effective stretch should be held for at least 30 seconds.  A stretch should never be painful. You should only feel a gentle lengthening or release in the stretched tissue. FLEXION RANGE OF MOTION AND STRETCHING EXERCISES: STRETCH - Flexion, Single Knee to Chest   Lie on a firm bed or floor with both legs extended in front of you.  Keeping one leg in contact with the floor, bring your opposite knee to your chest. Hold your leg in place by either grabbing behind your thigh or at your knee.  Pull until you feel a gentle stretch in your low back. Hold __________ seconds.  Slowly release your grasp and repeat the exercise with the opposite side. Repeat __________ times. Complete this exercise __________ times per day.  STRETCH - Flexion, Double Knee to Chest  Lie on a firm bed or floor with both legs extended in front of you.  Keeping one leg in contact with the floor, bring your opposite knee to your chest.  Tense your stomach muscles to support your back and then lift your other knee to your chest. Hold your legs in place by either grabbing behind your thighs or at your knees.  Pull both knees toward your chest until you feel a gentle stretch in your low back. Hold __________ seconds.  Tense your stomach muscles and slowly return one leg at a time to the floor. Repeat __________ times. Complete this exercise __________ times per day.  STRETCH - Low Trunk Rotation   Lie on a  firm bed or floor. Keeping your legs in front of you, bend your knees so they are both pointed toward the ceiling and your feet are flat on the floor.  Extend your arms out to the side. This will stabilize your upper body by keeping your shoulders in contact with the floor.  Gently and slowly drop both knees together to one side until you feel a gentle stretch in your low back. Hold for __________ seconds.  Tense your stomach  muscles to support your low back as you bring your knees back to the starting position. Repeat the exercise to the other side. Repeat __________ times. Complete this exercise __________ times per day  EXTENSION RANGE OF MOTION AND FLEXIBILITY EXERCISES: STRETCH - Extension, Prone on Elbows  Lie on your stomach on the floor, a bed will be too soft. Place your palms about shoulder width apart and at the height of your head.  Place your elbows under your shoulders. If this is too painful, stack pillows under your chest.  Allow your body to relax so that your hips drop lower and make contact more completely with the floor.  Hold this position for __________ seconds.  Slowly return to lying flat on the floor. Repeat __________ times. Complete this exercise __________ times per day.  RANGE OF MOTION - Extension, Prone Press Ups  Lie on your stomach on the floor, a bed will be too soft. Place your palms about shoulder width apart and at the height of your head.  Keeping your back as relaxed as possible, slowly straighten your elbows while keeping your hips on the floor. You may adjust the placement of your hands to maximize your comfort. As you gain motion, your hands will come more underneath your shoulders.  Hold this position __________ seconds.  Slowly return to lying flat on the floor. Repeat __________ times. Complete this exercise __________ times per day.  STRENGTHENING EXERCISES - Sciatica  These exercises may help you when beginning to rehabilitate your injury. These exercises should be done near your "sweet spot." This is the neutral, low-back arch, somewhere between fully rounded and fully arched, that is your least painful position. When performed in this safe range of motion, these exercises can be used for people who have either a flexion or extension based injury. These exercises may resolve your symptoms with or without further involvement from your physician, physical therapist  or athletic trainer. While completing these exercises, remember:   Muscles can gain both the endurance and the strength needed for everyday activities through controlled exercises.  Complete these exercises as instructed by your physician, physical therapist or athletic trainer. Progress with the resistance and repetition exercises only as your caregiver advises.  You may experience muscle soreness or fatigue, but the pain or discomfort you are trying to eliminate should never worsen during these exercises. If this pain does worsen, stop and make certain you are following the directions exactly. If the pain is still present after adjustments, discontinue the exercise until you can discuss the trouble with your clinician. STRENGTHENING - Deep Abdominals, Pelvic Tilt   Lie on a firm bed or floor. Keeping your legs in front of you, bend your knees so they are both pointed toward the ceiling and your feet are flat on the floor.  Tense your lower abdominal muscles to press your low back into the floor. This motion will rotate your pelvis so that your tail bone is scooping upwards rather than pointing at your feet or into  the floor.  With a gentle tension and even breathing, hold this position for __________ seconds. Repeat __________ times. Complete this exercise __________ times per day.  STRENGTHENING - Abdominals, Crunches   Lie on a firm bed or floor. Keeping your legs in front of you, bend your knees so they are both pointed toward the ceiling and your feet are flat on the floor. Cross your arms over your chest.  Slightly tip your chin down without bending your neck.  Tense your abdominals and slowly lift your trunk high enough to just clear your shoulder blades. Lifting higher can put excessive stress on the low back and does not further strengthen your abdominal muscles.  Control your return to the starting position. Repeat __________ times. Complete this exercise __________ times per day.    STRENGTHENING - Quadruped, Opposite UE/LE Lift  Assume a hands and knees position on a firm surface. Keep your hands under your shoulders and your knees under your hips. You may place padding under your knees for comfort.  Find your neutral spine and gently tense your abdominal muscles so that you can maintain this position. Your shoulders and hips should form a rectangle that is parallel with the floor and is not twisted.  Keeping your trunk steady, lift your right hand no higher than your shoulder and then your left leg no higher than your hip. Make sure you are not holding your breath. Hold this position __________ seconds.  Continuing to keep your abdominal muscles tense and your back steady, slowly return to your starting position. Repeat with the opposite arm and leg. Repeat __________ times. Complete this exercise __________ times per day.  STRENGTHENING - Abdominals and Quadriceps, Straight Leg Raise   Lie on a firm bed or floor with both legs extended in front of you.  Keeping one leg in contact with the floor, bend the other knee so that your foot can rest flat on the floor.  Find your neutral spine, and tense your abdominal muscles to maintain your spinal position throughout the exercise.  Slowly lift your straight leg off the floor about 6 inches for a count of 15, making sure to not hold your breath.  Still keeping your neutral spine, slowly lower your leg all the way to the floor. Repeat this exercise with each leg __________ times. Complete this exercise __________ times per day. POSTURE AND BODY MECHANICS CONSIDERATIONS - Sciatica Keeping correct posture when sitting, standing or completing your activities will reduce the stress put on different body tissues, allowing injured tissues a chance to heal and limiting painful experiences. The following are general guidelines for improved posture. Your physician or physical therapist will provide you with any instructions specific  to your needs. While reading these guidelines, remember:  The exercises prescribed by your provider will help you have the flexibility and strength to maintain correct postures.  The correct posture provides the optimal environment for your joints to work. All of your joints have less wear and tear when properly supported by a spine with good posture. This means you will experience a healthier, less painful body.  Correct posture must be practiced with all of your activities, especially prolonged sitting and standing. Correct posture is as important when doing repetitive low-stress activities (typing) as it is when doing a single heavy-load activity (lifting). RESTING POSITIONS Consider which positions are most painful for you when choosing a resting position. If you have pain with flexion-based activities (sitting, bending, stooping, squatting), choose a position that allows  you to rest in a less flexed posture. You would want to avoid curling into a fetal position on your side. If your pain worsens with extension-based activities (prolonged standing, working overhead), avoid resting in an extended position such as sleeping on your stomach. Most people will find more comfort when they rest with their spine in a more neutral position, neither too rounded nor too arched. Lying on a non-sagging bed on your side with a pillow between your knees, or on your back with a pillow under your knees will often provide some relief. Keep in mind, being in any one position for a prolonged period of time, no matter how correct your posture, can still lead to stiffness. PROPER SITTING POSTURE In order to minimize stress and discomfort on your spine, you must sit with correct posture Sitting with good posture should be effortless for a healthy body. Returning to good posture is a gradual process. Many people can work toward this most comfortably by using various supports until they have the flexibility and strength to  maintain this posture on their own. When sitting with proper posture, your ears will fall over your shoulders and your shoulders will fall over your hips. You should use the back of the chair to support your upper back. Your low back will be in a neutral position, just slightly arched. You may place a small pillow or folded towel at the base of your low back for support.  When working at a desk, create an environment that supports good, upright posture. Without extra support, muscles fatigue and lead to excessive strain on joints and other tissues. Keep these recommendations in mind: CHAIR:   A chair should be able to slide under your desk when your back makes contact with the back of the chair. This allows you to work closely.  The chair's height should allow your eyes to be level with the upper part of your monitor and your hands to be slightly lower than your elbows. BODY POSITION  Your feet should make contact with the floor. If this is not possible, use a foot rest.  Keep your ears over your shoulders. This will reduce stress on your neck and low back. INCORRECT SITTING POSTURES   If you are feeling tired and unable to assume a healthy sitting posture, do not slouch or slump. This puts excessive strain on your back tissues, causing more damage and pain. Healthier options include:  Using more support, like a lumbar pillow.  Switching tasks to something that requires you to be upright or walking.  Talking a brief walk.  Lying down to rest in a neutral-spine position. PROLONGED STANDING WHILE SLIGHTLY LEANING FORWARD  When completing a task that requires you to lean forward while standing in one place for a long time, place either foot up on a stationary 2-4 inch high object to help maintain the best posture. When both feet are on the ground, the low back tends to lose its slight inward curve. If this curve flattens (or becomes too large), then the back and your other joints will  experience too much stress, fatigue more quickly and can cause pain.  CORRECT STANDING POSTURES Proper standing posture should be assumed with all daily activities, even if they only take a few moments, like when brushing your teeth. As in sitting, your ears should fall over your shoulders and your shoulders should fall over your hips. You should keep a slight tension in your abdominal muscles to brace your spine. Your  tailbone should point down to the ground, not behind your body, resulting in an over-extended swayback posture.  INCORRECT STANDING POSTURES  Common incorrect standing postures include a forward head, locked knees and/or an excessive swayback. WALKING Walk with an upright posture. Your ears, shoulders and hips should all line-up. PROLONGED ACTIVITY IN A FLEXED POSITION When completing a task that requires you to bend forward at your waist or lean over a low surface, try to find a way to stabilize 3 of 4 of your limbs. You can place a hand or elbow on your thigh or rest a knee on the surface you are reaching across. This will provide you more stability so that your muscles do not fatigue as quickly. By keeping your knees relaxed, or slightly bent, you will also reduce stress across your low back. CORRECT LIFTING TECHNIQUES DO :   Assume a wide stance. This will provide you more stability and the opportunity to get as close as possible to the object which you are lifting.  Tense your abdominals to brace your spine; then bend at the knees and hips. Keeping your back locked in a neutral-spine position, lift using your leg muscles. Lift with your legs, keeping your back straight.  Test the weight of unknown objects before attempting to lift them.  Try to keep your elbows locked down at your sides in order get the best strength from your shoulders when carrying an object.  Always ask for help when lifting heavy or awkward objects. INCORRECT LIFTING TECHNIQUES DO NOT:   Lock your  knees when lifting, even if it is a small object.  Bend and twist. Pivot at your feet or move your feet when needing to change directions.  Assume that you cannot safely pick up a paperclip without proper posture. Document Released: 07/17/2005 Document Revised: 12/01/2013 Document Reviewed: 10/29/2008 [facility] Patient Information 2015 [place], [place]. This information is not intended to replace advice given to you by your health care provider. Make sure you discuss any questions you have with your health care provider.

## 2014-04-03 NOTE — ED Notes (Signed)
C/o severe back pain onset 9 days Denies inj/trauma, urinary sx Has had this pain in the past and resolved w/naproxen/tramadol Pain is getting worse Alert, no signs of acute distress.

## 2014-04-18 ENCOUNTER — Ambulatory Visit (HOSPITAL_COMMUNITY): Payer: Self-pay | Attending: Family Medicine

## 2014-04-18 ENCOUNTER — Emergency Department (HOSPITAL_COMMUNITY)
Admission: EM | Admit: 2014-04-18 | Discharge: 2014-04-18 | Disposition: A | Discharge status: Home / Self Care | Payer: Self-pay | Source: Home / Self Care | Attending: Family Medicine | Admitting: Family Medicine

## 2014-04-18 DIAGNOSIS — M545 Low back pain, unspecified: Secondary | ICD-10-CM

## 2014-04-18 MED ORDER — HYDROCODONE-ACETAMINOPHEN 5-325 MG PO TABS: 1.0000 | ORAL_TABLET | Freq: Four times a day (QID) | ORAL | Status: DC | PRN

## 2014-04-18 MED ORDER — CYCLOBENZAPRINE HCL 5 MG PO TABS: 5.0000 mg | ORAL_TABLET | Freq: Every evening | ORAL | Status: DC | PRN

## 2014-04-18 NOTE — Discharge Instructions (Signed)
Thank you for coming in today. Come back or go to the emergency room if you notice new weakness new numbness problems walking or bowel or bladder problems.  Follow up with Sports Medicine.    Back Exercises These exercises may help you when beginning to rehabilitate your injury. Your symptoms may resolve with or without further involvement from your physician, physical therapist or athletic trainer. While completing these exercises, remember:   Restoring tissue flexibility helps normal motion to return to the joints. This allows healthier, less painful movement and activity.  An effective stretch should be held for at least 30 seconds.  A stretch should never be painful. You should only feel a gentle lengthening or release in the stretched tissue. STRETCH - Extension, Prone on Elbows   Lie on your stomach on the floor, a bed will be too soft. Place your palms about shoulder width apart and at the height of your head.  Place your elbows under your shoulders. If this is too painful, stack pillows under your chest.  Allow your body to relax so that your hips drop lower and make contact more completely with the floor.  Hold this position for __________ seconds.  Slowly return to lying flat on the floor. Repeat __________ times. Complete this exercise __________ times per day.  RANGE OF MOTION - Extension, Prone Press Ups   Lie on your stomach on the floor, a bed will be too soft. Place your palms about shoulder width apart and at the height of your head.  Keeping your back as relaxed as possible, slowly straighten your elbows while keeping your hips on the floor. You may adjust the placement of your hands to maximize your comfort. As you gain motion, your hands will come more underneath your shoulders.  Hold this position __________ seconds.  Slowly return to lying flat on the floor. Repeat __________ times. Complete this exercise __________ times per day.  RANGE OF MOTION- Quadruped,  Neutral Spine   Assume a hands and knees position on a firm surface. Keep your hands under your shoulders and your knees under your hips. You may place padding under your knees for comfort.  Drop your head and point your tail bone toward the ground below you. This will round out your low back like an angry cat. Hold this position for __________ seconds.  Slowly lift your head and release your tail bone so that your back sags into a large arch, like an old horse.  Hold this position for __________ seconds.  Repeat this until you feel limber in your low back.  Now, find your "sweet spot." This will be the most comfortable position somewhere between the two previous positions. This is your neutral spine. Once you have found this position, tense your stomach muscles to support your low back.  Hold this position for __________ seconds. Repeat __________ times. Complete this exercise __________ times per day.  STRETCH - Flexion, Single Knee to Chest   Lie on a firm bed or floor with both legs extended in front of you.  Keeping one leg in contact with the floor, bring your opposite knee to your chest. Hold your leg in place by either grabbing behind your thigh or at your knee.  Pull until you feel a gentle stretch in your low back. Hold __________ seconds.  Slowly release your grasp and repeat the exercise with the opposite side. Repeat __________ times. Complete this exercise __________ times per day.  STRETCH - Hamstrings, Standing  Stand or  sit and extend your right / left leg, placing your foot on a chair or foot stool  Keeping a slight arch in your low back and your hips straight forward.  Lead with your chest and lean forward at the waist until you feel a gentle stretch in the back of your right / left knee or thigh. (When done correctly, this exercise requires leaning only a small distance.)  Hold this position for __________ seconds. Repeat __________ times. Complete this stretch  __________ times per day. STRENGTHENING - Deep Abdominals, Pelvic Tilt   Lie on a firm bed or floor. Keeping your legs in front of you, bend your knees so they are both pointed toward the ceiling and your feet are flat on the floor.  Tense your lower abdominal muscles to press your low back into the floor. This motion will rotate your pelvis so that your tail bone is scooping upwards rather than pointing at your feet or into the floor.  With a gentle tension and even breathing, hold this position for __________ seconds. Repeat __________ times. Complete this exercise __________ times per day.  STRENGTHENING - Abdominals, Crunches   Lie on a firm bed or floor. Keeping your legs in front of you, bend your knees so they are both pointed toward the ceiling and your feet are flat on the floor. Cross your arms over your chest.  Slightly tip your chin down without bending your neck.  Tense your abdominals and slowly lift your trunk high enough to just clear your shoulder blades. Lifting higher can put excessive stress on the low back and does not further strengthen your abdominal muscles.  Control your return to the starting position. Repeat __________ times. Complete this exercise __________ times per day.  STRENGTHENING - Quadruped, Opposite UE/LE Lift   Assume a hands and knees position on a firm surface. Keep your hands under your shoulders and your knees under your hips. You may place padding under your knees for comfort.  Find your neutral spine and gently tense your abdominal muscles so that you can maintain this position. Your shoulders and hips should form a rectangle that is parallel with the floor and is not twisted.  Keeping your trunk steady, lift your right hand no higher than your shoulder and then your left leg no higher than your hip. Make sure you are not holding your breath. Hold this position __________ seconds.  Continuing to keep your abdominal muscles tense and your back  steady, slowly return to your starting position. Repeat with the opposite arm and leg. Repeat __________ times. Complete this exercise __________ times per day. Document Released: 08/04/2005 Document Revised: 10/09/2011 Document Reviewed: 10/29/2008 [facility] Patient Information 2015 [place], [place]. This information is not intended to replace advice given to you by your health care provider. Make sure you discuss any questions you have with your health care provider.

## 2014-04-18 NOTE — ED Provider Notes (Signed)
Zachary Powers is a 48 y.o. male who presents to Urgent Care today for right sided low back pain. Patient has had continued low back pain now for several weeks. He was seen in September for this and treated with prednisone and Flexeril. This worked well however once the prednisone ran out his pain returned. He denies any bowel bladder dysfunction or difficulty walking or numbness or weakness. He does note pain radiating to his right knee. No fevers or chills. No injury.   Past Medical History  Diagnosis Date  . Asthma   . Back pain    History  Substance Use Topics  . Smoking status: Current Every Day Smoker  . Smokeless tobacco: Not on file  . Alcohol Use: No   ROS as above Medications: No current facility-administered medications for this encounter.   Current Outpatient Prescriptions  Medication Sig Dispense Refill  . Aspirin-Caffeine (BC FAST PAIN RELIEF PO) Take 1 packet by mouth every 8 (eight) hours as needed. For pain      . cyclobenzaprine (FLEXERIL) 5 MG tablet Take 1-2 tablets (5-10 mg total) by mouth 3 (three) times daily as needed for muscle spasms.  30 tablet  0  . cyclobenzaprine (FLEXERIL) 5 MG tablet Take 1 tablet (5 mg total) by mouth at bedtime as needed for muscle spasms.  20 tablet  0  . HYDROcodone-acetaminophen (NORCO/VICODIN) 5-325 MG per tablet Take 1 tablet by mouth every 6 (six) hours as needed.  15 tablet  0  . OVER THE COUNTER MEDICATION Patient cannot remember what medicines he tried last week from pcp visit.  Called [facility] [address] [facility] and they reported: nicotine patches, aspirin, tramadol 50, diclofenac75, and zanaflex 4 mg.      . [DISCONTINUED] albuterol (PROVENTIL HFA;VENTOLIN HFA) 108 (90 BASE) MCG/ACT inhaler Inhale 2 puffs into the lungs every 4 (four) hours as needed for wheezing or shortness of breath.  1 Inhaler  12  . [DISCONTINUED] ipratropium (ATROVENT) 0.06 % nasal spray Place 2 sprays into both nostrils 4 (four) times daily.  15 mL  12     Exam:  BP 110/65  Pulse 84  Temp(Src) 98.3 F (36.8 C) (Oral)  Resp 16  SpO2 97% Gen: Well NAD HEENT: EOMI,  MMM Lungs: Normal work of breathing. CTABL Heart: RRR no MRG Abd: NABS, Soft. Nondistended, Nontender Exts: Brisk capillary refill, warm and well perfused.  Back: Nontender to spinal midline. Tender palpation right lumbar paraspinal and SI joint. Range of motion limited due to pain. Lower extremity strength sensation reflexes are intact. Normal gait  No results found for this or any previous visit (from the past 24 hour(s)). Dg Lumbar Spine Complete  04/18/2014   CLINICAL DATA:  Right lower back pain.  No known injury.  EXAM: LUMBAR SPINE - COMPLETE 4+ VIEW  COMPARISON:  12/05/2011.  FINDINGS: Five non-rib-bearing lumbar vertebrae. Mild anterior spur formation in the lower thoracic spine. No fractures, pars defects or subluxations.  IMPRESSION: No fracture or subluxation.   Electronically Signed   By: Enrique Sack M.D.   On: 04/18/2014 13:17    Assessment and Plan: 48 y.o. male with lumbago. Plan for Norco and referral to sports medicine. Patient may benefit from MRI and guided epidural steroid injection .  Home exercise program also reviewed.  Discussed warning signs or symptoms. Please see discharge instructions. Patient expresses understanding.     Gregor Hams, MD 04/18/14 1351

## 2014-04-18 NOTE — ED Notes (Signed)
Back pain continues to be a problem

## 2014-04-18 NOTE — ED Notes (Signed)
Called radiology, spoke with Estill Bamberg and made her aware patient would be on the way shortly.

## 2014-08-25 ENCOUNTER — Emergency Department (HOSPITAL_COMMUNITY)
Admission: EM | Admit: 2014-08-25 | Discharge: 2014-08-25 | Disposition: A | Discharge status: Home / Self Care | Payer: 59 | Attending: Emergency Medicine | Admitting: Emergency Medicine

## 2014-08-25 ENCOUNTER — Encounter (HOSPITAL_COMMUNITY): Payer: Self-pay | Admitting: *Deleted

## 2014-08-25 DIAGNOSIS — J45909 Unspecified asthma, uncomplicated: Secondary | ICD-10-CM | POA: Diagnosis not present

## 2014-08-25 DIAGNOSIS — S161XXA Strain of muscle, fascia and tendon at neck level, initial encounter: Secondary | ICD-10-CM | POA: Insufficient documentation

## 2014-08-25 DIAGNOSIS — Z720 Tobacco use: Secondary | ICD-10-CM | POA: Insufficient documentation

## 2014-08-25 DIAGNOSIS — Y92410 Unspecified street and highway as the place of occurrence of the external cause: Secondary | ICD-10-CM | POA: Diagnosis not present

## 2014-08-25 DIAGNOSIS — Y9389 Activity, other specified: Secondary | ICD-10-CM | POA: Insufficient documentation

## 2014-08-25 DIAGNOSIS — S3992XA Unspecified injury of lower back, initial encounter: Secondary | ICD-10-CM | POA: Insufficient documentation

## 2014-08-25 DIAGNOSIS — Y998 Other external cause status: Secondary | ICD-10-CM | POA: Diagnosis not present

## 2014-08-25 DIAGNOSIS — S199XXA Unspecified injury of neck, initial encounter: Secondary | ICD-10-CM | POA: Diagnosis present

## 2014-08-25 DIAGNOSIS — Z79899 Other long term (current) drug therapy: Secondary | ICD-10-CM | POA: Insufficient documentation

## 2014-08-25 MED ORDER — HYDROCODONE-ACETAMINOPHEN 5-325 MG PO TABS: 1.0000 | ORAL_TABLET | Freq: Four times a day (QID) | ORAL | Status: DC | PRN

## 2014-08-25 MED ORDER — DIAZEPAM 5 MG PO TABS: 5.0000 mg | ORAL_TABLET | Freq: Three times a day (TID) | ORAL | Status: DC | PRN
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
## 2014-08-25 NOTE — Discharge Instructions (Signed)
Cervical Sprain °A cervical sprain is an injury in the neck in which the strong, fibrous tissues (ligaments) that connect your neck bones stretch or tear. Cervical sprains can range from mild to severe. Severe cervical sprains can cause the neck vertebrae to be unstable. This can lead to damage of the spinal cord and can result in serious nervous system problems. The amount of time it takes for a cervical sprain to get better depends on the cause and extent of the injury. Most cervical sprains heal in 1 to 3 weeks. °CAUSES  °Severe cervical sprains may be caused by:  °· Contact sport injuries (such as from football, rugby, wrestling, hockey, auto racing, gymnastics, diving, martial arts, or boxing).   °· Motor vehicle collisions.   °· Whiplash injuries. This is an injury from a sudden forward and backward whipping movement of the head and neck.  °· Falls.   °Mild cervical sprains may be caused by:  °· Being in an awkward position, such as while cradling a telephone between your ear and shoulder.   °· Sitting in a chair that does not offer proper support.   °· Working at a poorly designed computer station.   °· Looking up or down for long periods of time.   °SYMPTOMS  °· Pain, soreness, stiffness, or a burning sensation in the front, back, or sides of the neck. This discomfort may develop immediately after the injury or slowly, 24 hours or more after the injury.   °· Pain or tenderness directly in the middle of the back of the neck.   °· Shoulder or upper back pain.   °· Limited ability to move the neck.   °· Headache.   °· Dizziness.   °· Weakness, numbness, or tingling in the hands or arms.   °· Muscle spasms.   °· Difficulty swallowing or chewing.   °· Tenderness and swelling of the neck.   °DIAGNOSIS  °Most of the time your health care provider can diagnose a cervical sprain by taking your history and doing a physical exam. Your health care provider will ask about previous neck injuries and any known neck  problems, such as arthritis in the neck. X-rays may be taken to find out if there are any other problems, such as with the bones of the neck. Other tests, such as a CT scan or MRI, may also be needed.  °TREATMENT  °Treatment depends on the severity of the cervical sprain. Mild sprains can be treated with rest, keeping the neck in place (immobilization), and pain medicines. Severe cervical sprains are immediately immobilized. Further treatment is done to help with pain, muscle spasms, and other symptoms and may include: °· Medicines, such as pain relievers, numbing medicines, or muscle relaxants.   °· Physical therapy. This may involve stretching exercises, strengthening exercises, and posture training. Exercises and improved posture can help stabilize the neck, strengthen muscles, and help stop symptoms from returning.   °HOME CARE INSTRUCTIONS  °· Put ice on the injured area.   °¨ Put ice in a plastic bag.   °¨ Place a towel between your skin and the bag.   °¨ Leave the ice on for 15-20 minutes, 3-4 times a day.   °· If your injury was severe, you may have been given a cervical collar to wear. A cervical collar is a two-piece collar designed to keep your neck from moving while it heals. °¨ Do not remove the collar unless instructed by your health care provider. °¨ If you have long hair, keep it outside of the collar. °¨ Ask your health care provider before making any adjustments to your collar. Minor   adjustments may be required over time to improve comfort and reduce pressure on your chin or on the back of your head. °¨ If you are allowed to remove the collar for cleaning or bathing, follow your health care provider's instructions on how to do so safely. °¨ Keep your collar clean by wiping it with mild soap and water and drying it completely. If the collar you have been given includes removable pads, remove them every 1-2 days and hand wash them with soap and water. Allow them to air dry. They should be completely  dry before you wear them in the collar. °¨ If you are allowed to remove the collar for cleaning and bathing, wash and dry the skin of your neck. Check your skin for irritation or sores. If you see any, tell your health care provider. °¨ Do not drive while wearing the collar.   °· Only take over-the-counter or prescription medicines for pain, discomfort, or fever as directed by your health care provider.   °· Keep all follow-up appointments as directed by your health care provider.   °· Keep all physical therapy appointments as directed by your health care provider.   °· Make any needed adjustments to your workstation to promote good posture.   °· Avoid positions and activities that make your symptoms worse.   °· Warm up and stretch before being active to help prevent problems.   °SEEK MEDICAL CARE IF:  °· Your pain is not controlled with medicine.   °· You are unable to decrease your pain medicine over time as planned.   °· Your activity level is not improving as expected.   °SEEK IMMEDIATE MEDICAL CARE IF:  °· You develop any bleeding. °· You develop stomach upset. °· You have signs of an allergic reaction to your medicine.   °· Your symptoms get worse.   °· You develop new, unexplained symptoms.   °· You have numbness, tingling, weakness, or paralysis in any part of your body.   °MAKE SURE YOU:  °· Understand these instructions. °· Will watch your condition. °· Will get help right away if you are not doing well or get worse. °Document Released: 05/14/2007 Document Revised: 07/22/2013 Document Reviewed: 01/22/2013 °[employer]® Patient Information ©2015 [employer]. This information is not intended to replace advice given to you by your health care provider. Make sure you discuss any questions you have with your health care provider. ° °Motor Vehicle Collision °It is common to have multiple bruises and sore muscles after a motor vehicle collision (MVC). These tend to feel worse for the first 24 hours. You may have  the most stiffness and soreness over the first several hours. You may also feel worse when you wake up the first morning after your collision. After this point, you will usually begin to improve with each day. The speed of improvement often depends on the severity of the collision, the number of injuries, and the location and nature of these injuries. °HOME CARE INSTRUCTIONS °· Put ice on the injured area. °¨ Put ice in a plastic bag. °¨ Place a towel between your skin and the bag. °¨ Leave the ice on for 15-20 minutes, 3-4 times a day, or as directed by your health care provider. °· Drink enough fluids to keep your urine clear or pale yellow. Do not drink alcohol. °· Take a warm shower or bath once or twice a day. This will increase blood flow to sore muscles. °· You may return to activities as directed by your caregiver. Be careful when lifting, as this may aggravate neck or back   pain. °· Only take over-the-counter or prescription medicines for pain, discomfort, or fever as directed by your caregiver. Do not use aspirin. This may increase bruising and bleeding. °SEEK IMMEDIATE MEDICAL CARE IF: °· You have numbness, tingling, or weakness in the arms or legs. °· You develop severe headaches not relieved with medicine. °· You have severe neck pain, especially tenderness in the middle of the back of your neck. °· You have changes in bowel or bladder control. °· There is increasing pain in any area of the body. °· You have shortness of breath, light-headedness, dizziness, or fainting. °· You have chest pain. °· You feel sick to your stomach (nauseous), throw up (vomit), or sweat. °· You have increasing abdominal discomfort. °· There is blood in your urine, stool, or vomit. °· You have pain in your shoulder (shoulder strap areas). °· You feel your symptoms are getting worse. °MAKE SURE YOU: °· Understand these instructions. °· Will watch your condition. °· Will get help right away if you are not doing well or get  worse. °Document Released: 07/17/2005 Document Revised: 12/01/2013 Document Reviewed: 12/14/2010 °[employer]® Patient Information ©2015 [employer]. This information is not intended to replace advice given to you by your health care provider. Make sure you discuss any questions you have with your health care provider. ° °
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
## 2014-11-09 ENCOUNTER — Ambulatory Visit (HOSPITAL_COMMUNITY)
Admission: RE | Admit: 2014-11-09 | Discharge: 2014-11-09 | Disposition: A | Discharge status: Home / Self Care | Payer: 59 | Attending: Psychiatry | Admitting: Psychiatry
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
## 2014-11-11 ENCOUNTER — Emergency Department (HOSPITAL_COMMUNITY)
Admission: EM | Admit: 2014-11-11 | Discharge: 2014-11-11 | Disposition: A | Discharge status: Home / Self Care | Payer: 59 | Attending: Emergency Medicine | Admitting: Emergency Medicine

## 2014-11-11 ENCOUNTER — Encounter (HOSPITAL_COMMUNITY): Payer: Self-pay | Admitting: Emergency Medicine

## 2014-11-11 DIAGNOSIS — Z79899 Other long term (current) drug therapy: Secondary | ICD-10-CM | POA: Insufficient documentation

## 2014-11-11 DIAGNOSIS — J45901 Unspecified asthma with (acute) exacerbation: Secondary | ICD-10-CM | POA: Insufficient documentation

## 2014-11-11 DIAGNOSIS — Z791 Long term (current) use of non-steroidal anti-inflammatories (NSAID): Secondary | ICD-10-CM | POA: Diagnosis not present

## 2014-11-11 DIAGNOSIS — Z720 Tobacco use: Secondary | ICD-10-CM | POA: Diagnosis not present

## 2014-11-11 DIAGNOSIS — F419 Anxiety disorder, unspecified: Secondary | ICD-10-CM | POA: Insufficient documentation

## 2014-11-11 MED ORDER — LORAZEPAM 2 MG/ML IJ SOLN
1.0000 mg | Freq: Once | INTRAMUSCULAR | Status: AC
  Administered 2014-11-11: 1 mg via INTRAMUSCULAR
  Filled 2014-11-11: qty 1

## 2014-11-11 MED ORDER — LORAZEPAM 1 MG PO TABS: 1.0000 mg | ORAL_TABLET | Freq: Three times a day (TID) | ORAL | Status: DC | PRN

## 2014-11-11 NOTE — ED Notes (Signed)
Pt c/o anxiety and SOB onset Sunday, c/o history of the same.

## 2014-11-11 NOTE — ED Provider Notes (Signed)
CSN: [id number]     Arrival date & time 11/11/14  1433 History  This chart was scribed for a non-physician practitioner, Delos Haring, PA-C working with Pamella Pert, MD by Martinique Peace, ED Scribe. The patient was seen in [facility]. The patient's care was started at [place] [facility].    Chief Complaint  Patient presents with  . Anxiety      Patient is a 49 y.o. male presenting with anxiety. The history is provided by the patient. No language interpreter was used.  Anxiety Associated symptoms include shortness of breath.  HPI Comments: Zachary Powers is a 49 y.o. male who presents to the Emergency Department complaining of anxiety and SOB onset Sunday. Pt was prescribed Celexa and Ambien for anxiety following a breakup with his girlfriend by his PCP. Pt  was referred to a psychiatrist for further evaluation but states he wasn't able to get an appt for another couple weeks. Pt explains he didn't realize he needed to take Celexa everyday, but has been taking Ambien everyday which he adds helps him sleep at night. Pt further explains he hasn't been able to work since Friday due to anxiety and was having trouble getting out of bed this morning which is why he came in to ED to be seen. No reports of SI, HI, drug or alcohol use in the past 4 years.  He is here to get help for relief from his anxiety because he has been unable to control it, requests medication.   Past Medical History  Diagnosis Date  . Asthma   . Back pain    Past Surgical History  Procedure Laterality Date  . Hand surgery Right    History reviewed. No pertinent family history. History  Substance Use Topics  . Smoking status: Current Every Day Smoker  . Smokeless tobacco: Not on file  . Alcohol Use: No    Review of Systems  Respiratory: Positive for shortness of breath.   Psychiatric/Behavioral: Negative for suicidal ideas. The patient is nervous/anxious.   All other systems reviewed and are negative.     Allergies   Barbiturates  Home Medications   Prior to Admission medications   Medication Sig Start Date End Date Taking? Authorizing Provider  citalopram (CELEXA) 10 MG tablet Take 10 mg by mouth daily.   Yes Historical Provider, MD  naproxen (NAPROSYN) 500 MG tablet Take 500 mg by mouth daily.   Yes Historical Provider, MD  zolpidem (AMBIEN) 5 MG tablet Take 5 mg by mouth at bedtime.   Yes Historical Provider, MD  cyclobenzaprine (FLEXERIL) 5 MG tablet Take 1-2 tablets (5-10 mg total) by mouth 3 (three) times daily as needed for muscle spasms. Patient not taking: Reported on 11/11/2014 04/03/14   Waldemar Dickens, MD  cyclobenzaprine (FLEXERIL) 5 MG tablet Take 1 tablet (5 mg total) by mouth at bedtime as needed for muscle spasms. Patient not taking: Reported on 11/11/2014 04/18/14   Gregor Hams, MD  diazepam (VALIUM) 5 MG tablet Take 1 tablet (5 mg total) by mouth every 8 (eight) hours as needed for anxiety. 08/25/14   Montine Circle, PA-C  HYDROcodone-acetaminophen (NORCO/VICODIN) 5-325 MG per tablet Take 1-2 tablets by mouth every 6 (six) hours as needed. Patient not taking: Reported on 11/11/2014 08/25/14   Montine Circle, PA-C  LORazepam (ATIVAN) 1 MG tablet Take 1 tablet (1 mg total) by mouth 3 (three) times daily as needed for anxiety. 11/11/14   Anevay Campanella Carlota Raspberry, PA-C   BP 131/88 mmHg  Pulse  75  Temp(Src) 97.9 F (36.6 C) (Oral)  Resp 18  SpO2 100% Physical Exam  Constitutional: He is oriented to person, place, and time. He appears well-developed and well-nourished. No distress.  HENT:  Head: Normocephalic and atraumatic.  Eyes: Conjunctivae and EOM are normal.  Neck: Neck supple. No tracheal deviation present.  Cardiovascular: Normal rate.   Pulmonary/Chest: Effort normal. No respiratory distress.  Musculoskeletal: Normal range of motion.  Neurological: He is alert and oriented to person, place, and time.  Skin: Skin is warm and dry.  Psychiatric: His behavior is normal. His mood appears  anxious. He is not actively hallucinating. He does not exhibit a depressed mood. He expresses no homicidal and no suicidal ideation. He expresses no suicidal plans and no homicidal plans.  Nursing note and vitals reviewed.   ED Course  Procedures (including critical care time) Labs Review Labs Reviewed - No data to display  Imaging Review No results found.   EKG Interpretation None     Medications  LORazepam (ATIVAN) injection 1 mg (1 mg Intramuscular Given 11/11/14 1631)    5:02 PM- Treatment plan was discussed with patient who verbalizes understanding and agrees.   MDM   Final diagnoses:  Anxiety   Pt advised that he needs to take Celexa every day and not as needed. Will rx Ativan for as needed to bridge until Celexa is therapeutic levels. Pt has appt with psychiatric coming up. No SI/HI pt is well appearing.  Medications  LORazepam (ATIVAN) injection 1 mg (1 mg Intramuscular Given 11/11/14 1631)    Endorses relief with medication given in the ED, pt does not appear anxious.  49 y.o.Zachary Powers's evaluation in the Emergency Department is complete. It has been determined that no acute conditions requiring further emergency intervention are present at this time. The patient/guardian have been advised of the diagnosis and plan. We have discussed signs and symptoms that warrant return to the ED, such as changes or worsening in symptoms.  Vital signs are stable at discharge. Filed Vitals:   11/11/14 1431  BP: 131/88  Pulse: 75  Temp: 97.9 F (36.6 C)  Resp: 18    Patient/guardian has voiced understanding and agreed to follow-up with the PCP or specialist.   I personally performed the services described in this documentation, which was scribed in my presence. The recorded information has been reviewed and is accurate.    Delos Haring, PA-C 11/11/14 [place], MD 11/11/14 [phone]

## 2014-11-11 NOTE — Discharge Instructions (Signed)
Generalized Anxiety Disorder Generalized anxiety disorder (GAD) is a mental disorder. It interferes with life functions, including relationships, work, and school. GAD is different from normal anxiety, which everyone experiences at some point in their lives in response to specific life events and activities. Normal anxiety actually helps us prepare for and get through these life events and activities. Normal anxiety goes away after the event or activity is over.  GAD causes anxiety that is not necessarily related to specific events or activities. It also causes excess anxiety in proportion to specific events or activities. The anxiety associated with GAD is also difficult to control. GAD can vary from mild to severe. People with severe GAD can have intense waves of anxiety with physical symptoms (panic attacks).  SYMPTOMS The anxiety and worry associated with GAD are difficult to control. This anxiety and worry are related to many life events and activities and also occur more days than not for 6 months or longer. People with GAD also have three or more of the following symptoms (one or more in children):  Restlessness.   Fatigue.  Difficulty concentrating.   Irritability.  Muscle tension.  Difficulty sleeping or unsatisfying sleep. DIAGNOSIS GAD is diagnosed through an assessment by your health care provider. Your health care provider will ask you questions aboutyour mood,physical symptoms, and events in your life. Your health care provider may ask you about your medical history and use of alcohol or drugs, including prescription medicines. Your health care provider may also do a physical exam and blood tests. Certain medical conditions and the use of certain substances can cause symptoms similar to those associated with GAD. Your health care provider may refer you to a mental health specialist for further evaluation. TREATMENT The following therapies are usually used to treat GAD:    Medication. Antidepressant medication usually is prescribed for long-term daily control. Antianxiety medicines may be added in severe cases, especially when panic attacks occur.   Talk therapy (psychotherapy). Certain types of talk therapy can be helpful in treating GAD by providing support, education, and guidance. A form of talk therapy called cognitive behavioral therapy can teach you healthy ways to think about and react to daily life events and activities.  Stress managementtechniques. These include yoga, meditation, and exercise and can be very helpful when they are practiced regularly. A mental health specialist can help determine which treatment is best for you. Some people see improvement with one therapy. However, other people require a combination of therapies. Document Released: 11/11/2012 Document Revised: 12/01/2013 Document Reviewed: 11/11/2012 ExitCare Patient Information 2015 [employer]. This information is not intended to replace advice given to you by your health care provider. Make sure you discuss any questions you have with your health care provider.  

## 2014-11-23 ENCOUNTER — Ambulatory Visit (HOSPITAL_COMMUNITY): Payer: Self-pay | Admitting: Psychiatry

## 2014-11-30 ENCOUNTER — Encounter (HOSPITAL_COMMUNITY): Payer: Self-pay | Admitting: Emergency Medicine

## 2014-11-30 ENCOUNTER — Emergency Department (HOSPITAL_COMMUNITY): Payer: 59

## 2014-11-30 ENCOUNTER — Emergency Department (HOSPITAL_COMMUNITY)
Admission: EM | Admit: 2014-11-30 | Discharge: 2014-11-30 | Disposition: A | Discharge status: Home / Self Care | Payer: 59 | Attending: Emergency Medicine | Admitting: Emergency Medicine

## 2014-11-30 DIAGNOSIS — J069 Acute upper respiratory infection, unspecified: Secondary | ICD-10-CM

## 2014-11-30 DIAGNOSIS — R05 Cough: Secondary | ICD-10-CM | POA: Diagnosis present

## 2014-11-30 DIAGNOSIS — J45909 Unspecified asthma, uncomplicated: Secondary | ICD-10-CM | POA: Insufficient documentation

## 2014-11-30 DIAGNOSIS — Z720 Tobacco use: Secondary | ICD-10-CM | POA: Diagnosis not present

## 2014-11-30 DIAGNOSIS — Z79899 Other long term (current) drug therapy: Secondary | ICD-10-CM | POA: Insufficient documentation

## 2014-11-30 MED ORDER — BENZONATATE 100 MG PO CAPS: 200.0000 mg | ORAL_CAPSULE | Freq: Two times a day (BID) | ORAL | Status: DC | PRN

## 2014-11-30 NOTE — ED Notes (Signed)
Reported bp to rob, pa-c, as patient is concerned about reading. Rob acknowledges, advises patient's PCP follows. Preparing for discharge

## 2014-11-30 NOTE — ED Notes (Signed)
Pt reports productive cough- yellow in color x 1.5 wks; pt reports burning chest pain with "coughing spells"; pt reports "had cold last week and I did my usual home remedy but the cough remains"

## 2014-11-30 NOTE — ED Notes (Signed)
Patient now back in room.

## 2014-11-30 NOTE — ED Notes (Signed)
Walked patient to pediatric waiting room as requested to check on son. Informed him that room is held unless he leaves. Rob, PA-C, aware that patient is currently in waiting room with son awaiting update.

## 2014-11-30 NOTE — Discharge Instructions (Signed)
Upper Respiratory Infection, Adult An upper respiratory infection (URI) is also sometimes known as the common cold. The upper respiratory tract includes the nose, sinuses, throat, trachea, and bronchi. Bronchi are the airways leading to the lungs. Most people improve within 1 week, but symptoms can last up to 2 weeks. A residual cough may last even longer.  CAUSES Many different viruses can infect the tissues lining the upper respiratory tract. The tissues become irritated and inflamed and often become very moist. Mucus production is also common. A cold is contagious. You can easily spread the virus to others by oral contact. This includes kissing, sharing a glass, coughing, or sneezing. Touching your mouth or nose and then touching a surface, which is then touched by another person, can also spread the virus. SYMPTOMS  Symptoms typically develop 1 to 3 days after you come in contact with a cold virus. Symptoms vary from person to person. They may include:  Runny nose.  Sneezing.  Nasal congestion.  Sinus irritation.  Sore throat.  Loss of voice (laryngitis).  Cough.  Fatigue.  Muscle aches.  Loss of appetite.  Headache.  Low-grade fever. DIAGNOSIS  You might diagnose your own cold based on familiar symptoms, since most people get a cold 2 to 3 times a year. Your caregiver can confirm this based on your exam. Most importantly, your caregiver can check that your symptoms are not due to another disease such as strep throat, sinusitis, pneumonia, asthma, or epiglottitis. Blood tests, throat tests, and X-rays are not necessary to diagnose a common cold, but they may sometimes be helpful in excluding other more serious diseases. Your caregiver will decide if any further tests are required. RISKS AND COMPLICATIONS  You may be at risk for a more severe case of the common cold if you smoke cigarettes, have chronic heart disease (such as heart failure) or lung disease (such as asthma), or if  you have a weakened immune system. The very young and very old are also at risk for more serious infections. Bacterial sinusitis, middle ear infections, and bacterial pneumonia can complicate the common cold. The common cold can worsen asthma and chronic obstructive pulmonary disease (COPD). Sometimes, these complications can require emergency medical care and may be life-threatening. PREVENTION  The best way to protect against getting a cold is to practice good hygiene. Avoid oral or hand contact with people with cold symptoms. Wash your hands often if contact occurs. There is no clear evidence that vitamin C, vitamin E, echinacea, or exercise reduces the chance of developing a cold. However, it is always recommended to get plenty of rest and practice good nutrition. TREATMENT  Treatment is directed at relieving symptoms. There is no cure. Antibiotics are not effective, because the infection is caused by a virus, not by bacteria. Treatment may include:  Increased fluid intake. Sports drinks offer valuable electrolytes, sugars, and fluids.  Breathing heated mist or steam (vaporizer or shower).  Eating chicken soup or other clear broths, and maintaining good nutrition.  Getting plenty of rest.  Using gargles or lozenges for comfort.  Controlling fevers with ibuprofen or acetaminophen as directed by your caregiver.  Increasing usage of your inhaler if you have asthma. Zinc gel and zinc lozenges, taken in the first 24 hours of the common cold, can shorten the duration and lessen the severity of symptoms. Pain medicines may help with fever, muscle aches, and throat pain. A variety of non-prescription medicines are available to treat congestion and runny nose. Your caregiver   can make recommendations and may suggest nasal or lung inhalers for other symptoms.  HOME CARE INSTRUCTIONS   Only take over-the-counter or prescription medicines for pain, discomfort, or fever as directed by your  caregiver.  Use a warm mist humidifier or inhale steam from a shower to increase air moisture. This may keep secretions moist and make it easier to breathe.  Drink enough water and fluids to keep your urine clear or pale yellow.  Rest as needed.  Return to work when your temperature has returned to normal or as your caregiver advises. You may need to stay home longer to avoid infecting others. You can also use a face mask and careful hand washing to prevent spread of the virus. SEEK MEDICAL CARE IF:   After the first few days, you feel you are getting worse rather than better.  You need your caregiver's advice about medicines to control symptoms.  You develop chills, worsening shortness of breath, or brown or red sputum. These may be signs of pneumonia.  You develop yellow or brown nasal discharge or pain in the face, especially when you bend forward. These may be signs of sinusitis.  You develop a fever, swollen neck glands, pain with swallowing, or white areas in the back of your throat. These may be signs of strep throat. SEEK IMMEDIATE MEDICAL CARE IF:   You have a fever.  You develop severe or persistent headache, ear pain, sinus pain, or chest pain.  You develop wheezing, a prolonged cough, cough up blood, or have a change in your usual mucus (if you have chronic lung disease).  You develop sore muscles or a stiff neck. Document Released: 01/10/2001 Document Revised: 10/09/2011 Document Reviewed: 10/22/2013 ExitCare Patient Information 2015 [employer]. This information is not intended to replace advice given to you by your health care provider. Make sure you discuss any questions you have with your health care provider.  

## 2014-11-30 NOTE — ED Provider Notes (Signed)
CSN: [id number]     Arrival date & time 11/30/14  1930 History   First MD Initiated Contact with Patient 11/30/14 2026     Chief Complaint  Patient presents with  . Cough     (Consider location/radiation/quality/duration/timing/severity/associated sxs/prior Treatment) HPI Comments: Patient presents to the emergency department with chief complaint of cough times one and a half weeks. Reports associated productive sputum. Denies any fevers. Reports associated rhinorrhea. He denies a sore throat. He has not taken anything to alleviate his symptoms. There are no aggravating or alleviating factors.  The history is provided by the patient. No language interpreter was used.    Past Medical History  Diagnosis Date  . Asthma   . Back pain    Past Surgical History  Procedure Laterality Date  . Hand surgery Right    History reviewed. No pertinent family history. History  Substance Use Topics  . Smoking status: Current Every Day Smoker  . Smokeless tobacco: Not on file  . Alcohol Use: No    Review of Systems  Constitutional: Negative for fever and chills.  HENT: Positive for postnasal drip, rhinorrhea, sinus pressure and sneezing. Negative for sore throat.   Respiratory: Positive for cough. Negative for shortness of breath.   Cardiovascular: Negative for chest pain.  Gastrointestinal: Negative for nausea, vomiting, abdominal pain, diarrhea and constipation.  Genitourinary: Negative for dysuria.      Allergies  Barbiturates  Home Medications   Prior to Admission medications   Medication Sig Start Date End Date Taking? Authorizing Provider  citalopram (CELEXA) 10 MG tablet Take 10 mg by mouth daily.   Yes Historical Provider, MD  naproxen (NAPROSYN) 500 MG tablet Take 500 mg by mouth daily as needed for mild pain or moderate pain.    Yes Historical Provider, MD  zolpidem (AMBIEN) 5 MG tablet Take 5 mg by mouth at bedtime as needed for sleep.    Yes Historical Provider, MD   benzonatate (TESSALON) 100 MG capsule Take 2 capsules (200 mg total) by mouth 2 (two) times daily as needed for cough. 11/30/14   Montine Circle, PA-C  cyclobenzaprine (FLEXERIL) 5 MG tablet Take 1-2 tablets (5-10 mg total) by mouth 3 (three) times daily as needed for muscle spasms. Patient not taking: Reported on 11/11/2014 04/03/14   Waldemar Dickens, MD  cyclobenzaprine (FLEXERIL) 5 MG tablet Take 1 tablet (5 mg total) by mouth at bedtime as needed for muscle spasms. Patient not taking: Reported on 11/11/2014 04/18/14   Gregor Hams, MD  diazepam (VALIUM) 5 MG tablet Take 1 tablet (5 mg total) by mouth every 8 (eight) hours as needed for anxiety. Patient not taking: Reported on 11/30/2014 08/25/14   Montine Circle, PA-C  HYDROcodone-acetaminophen (NORCO/VICODIN) 5-325 MG per tablet Take 1-2 tablets by mouth every 6 (six) hours as needed. Patient not taking: Reported on 11/11/2014 08/25/14   Montine Circle, PA-C  LORazepam (ATIVAN) 1 MG tablet Take 1 tablet (1 mg total) by mouth 3 (three) times daily as needed for anxiety. Patient not taking: Reported on 11/30/2014 11/11/14   Delos Haring, PA-C   BP 139/93 mmHg  Pulse 77  Temp(Src) 98.1 F (36.7 C) (Oral)  Resp 18  Ht 5\' 9"  (1.753 m)  Wt 204 lb 11.2 oz (92.851 kg)  BMI 30.22 kg/m2  SpO2 98% Physical Exam  Constitutional: He appears well-developed and well-nourished. No distress.  HENT:  Head: Normocephalic.  Right Ear: External ear normal.  Left Ear: External ear normal.  Mildly erythematous,  no tonsillar exudate, no abscess, no stridor, uvula is midline  TMs clear bilaterally  Eyes: Conjunctivae and EOM are normal. Pupils are equal, round, and reactive to light.  Neck: Normal range of motion. Neck supple.  Cardiovascular: Normal rate, regular rhythm and normal heart sounds.  Exam reveals no gallop and no friction rub.   No murmur heard. Pulmonary/Chest: Effort normal and breath sounds normal. No stridor. No respiratory distress. He has no  wheezes. He has no rales. He exhibits no tenderness.  CTAB  Abdominal: Soft. Bowel sounds are normal. He exhibits no distension. There is no tenderness.  Musculoskeletal: Normal range of motion. He exhibits no tenderness.  Neurological: He is alert.  Skin: Skin is warm and dry. No rash noted. He is not diaphoretic.  Psychiatric: He has a normal mood and affect. His behavior is normal. Judgment and thought content normal.  Nursing note and vitals reviewed.   ED Course  Procedures (including critical care time) Labs Review Labs Reviewed - No data to display  Imaging Review Dg Chest 2 View (if Patient Has Fever And/or Copd)  11/30/2014   CLINICAL DATA:  Cough and chest pain for 2 weeks  EXAM: CHEST  2 VIEW  COMPARISON:  05/27/2006  FINDINGS: There is mild chronic interstitial coarsening. The lungs are otherwise clear. Hilar, mediastinal and cardiac contours are unremarkable and unchanged. There are no pleural effusions.  IMPRESSION: No active cardiopulmonary disease.   Electronically Signed   By: Andreas Newport M.D.   On: 11/30/2014 21:12     EKG Interpretation None      MDM   Final diagnoses:  URI (upper respiratory infection)    Pt CXR negative for acute infiltrate. Patients symptoms are consistent with URI, likely viral etiology. Discussed that antibiotics are not indicated for viral infections. Pt will be discharged with symptomatic treatment.  Verbalizes understanding and is agreeable with plan. Pt is hemodynamically stable & in NAD prior to dc.     Montine Circle, PA-C 11/30/14 2158  Pattricia Boss, MD 12/02/14 [phone]

## 2015-07-17 ENCOUNTER — Telehealth (HOSPITAL_COMMUNITY): Payer: Self-pay | Admitting: *Deleted

## 2015-07-17 ENCOUNTER — Emergency Department (INDEPENDENT_AMBULATORY_CARE_PROVIDER_SITE_OTHER): Payer: Federal, State, Local not specified - Other

## 2015-07-17 ENCOUNTER — Emergency Department (INDEPENDENT_AMBULATORY_CARE_PROVIDER_SITE_OTHER)
Admission: EM | Admit: 2015-07-17 | Discharge: 2015-07-17 | Disposition: A | Discharge status: Home / Self Care | Payer: Medicaid Other | Source: Home / Self Care | Attending: Emergency Medicine | Admitting: Emergency Medicine

## 2015-07-17 ENCOUNTER — Encounter (HOSPITAL_COMMUNITY): Payer: Self-pay | Admitting: Emergency Medicine

## 2015-07-17 DIAGNOSIS — R6889 Other general symptoms and signs: Secondary | ICD-10-CM

## 2015-07-17 MED ORDER — MUPIROCIN CALCIUM 2 % NA OINT: TOPICAL_OINTMENT | NASAL | Status: DC

## 2015-07-17 MED ORDER — HYDROCODONE-HOMATROPINE 5-1.5 MG/5ML PO SYRP: 5.0000 mL | ORAL_SOLUTION | Freq: Four times a day (QID) | ORAL | Status: DC | PRN

## 2015-07-17 MED ORDER — BENZONATATE 100 MG PO CAPS: 100.0000 mg | ORAL_CAPSULE | Freq: Three times a day (TID) | ORAL | Status: DC | PRN

## 2015-07-17 NOTE — ED Provider Notes (Signed)
CSN: [id number]     Arrival date & time 07/17/15  1300 History   First MD Initiated Contact with Patient 07/17/15 1313     Chief Complaint  Patient presents with  . URI   (Consider location/radiation/quality/duration/timing/severity/associated sxs/prior Treatment) HPI  He is a 49 year old man here for evaluation of cough. His symptoms started about 4 days ago with a sore and scratchy throat. He states that has resolved, but he has developed nasal congestion, rhinorrhea, hoarseness, cough. He reports some nausea and vomiting 2 days ago, but none recently. He reports subjective fevers and sweats at home for the last 2 days. No documented temperature. He reports feeling intermittently short of breath. He has had some wheezing, but states his albuterol inhaler works well to control his symptoms. He also reports some burning in his chest, primarily with coughing. He does have body aches. He also reports several nosebleeds over the last 2 days. They stopped in 10-15 minutes. They're only from the left nostril. He's been taking over-the-counter cold medicines without improvement.  No known sick contacts.  Past Medical History  Diagnosis Date  . Asthma   . Back pain    Past Surgical History  Procedure Laterality Date  . Hand surgery Right    No family history on file. Social History  Substance Use Topics  . Smoking status: Current Every Day Smoker  . Smokeless tobacco: None  . Alcohol Use: No    Review of Systems As in history of present illness Allergies  Barbiturates  Home Medications   Prior to Admission medications   Medication Sig Start Date End Date Taking? Authorizing Provider  LISINOPRIL PO Take by mouth.   Yes Historical Provider, MD  OVER THE COUNTER MEDICATION Reflux medicine   Yes Historical Provider, MD  benzonatate (TESSALON) 100 MG capsule Take 1 capsule (100 mg total) by mouth 3 (three) times daily as needed for cough. 07/17/15   Melony Overly, MD  citalopram (CELEXA)  10 MG tablet Take 10 mg by mouth daily.    Historical Provider, MD  HYDROcodone-homatropine (HYCODAN) 5-1.5 MG/5ML syrup Take 5 mLs by mouth every 6 (six) hours as needed for cough. 07/17/15   Melony Overly, MD  mupirocin nasal ointment (BACTROBAN) 2 % Apply in each nostril at bedtime 07/17/15   Melony Overly, MD  naproxen (NAPROSYN) 500 MG tablet Take 500 mg by mouth daily as needed for mild pain or moderate pain.     Historical Provider, MD  zolpidem (AMBIEN) 5 MG tablet Take 5 mg by mouth at bedtime as needed for sleep.     Historical Provider, MD   Meds Ordered and Administered this Visit  Medications - No data to display  BP 124/73 mmHg  Pulse 82  Temp(Src) 97.8 F (36.6 C) (Oral)  Resp 16  SpO2 96% No data found.   Physical Exam  Constitutional: He is oriented to person, place, and time. He appears well-developed and well-nourished. No distress.  Appears fatigued  HENT:  Mouth/Throat: Oropharynx is clear and moist. No oropharyngeal exudate.  Nasal discharge present. He has some friability of the left septal mucosa.  Eyes: Conjunctivae are normal.  Neck: Neck supple.  Cardiovascular: Normal rate, regular rhythm and normal heart sounds.   No murmur heard. Pulmonary/Chest: Effort normal. No respiratory distress. He has no wheezes. He has no rales.  He has diffuse rhonchi, worse in the left lung fields.  Lymphadenopathy:    He has cervical adenopathy.  Neurological: He is alert  and oriented to person, place, and time.    ED Course  Procedures (including critical care time)  Labs Review Labs Reviewed - No data to display  Imaging Review Dg Chest 2 View  07/17/2015  CLINICAL DATA:  Sick for 3-4 days with cough and congestion, some chest pain, history smoking, asthma EXAM: CHEST  2 VIEW COMPARISON:  11/30/2014 FINDINGS: Upper normal heart size. Normal mediastinal contours and pulmonary vascularity. Decreased peribronchial thickening since previous exam. No acute infiltrate,  pleural effusion or pneumothorax. Bones unremarkable. IMPRESSION: No acute abnormalities. Electronically Signed   By: Lavonia Dana M.D.   On: 07/17/2015 14:18     MDM   1. Flu-like symptoms    X-ray negative for pneumonia. Likely flu versus bronchitis versus other viral illness. Symptomatic treatment with rest, fluids, Tylenol/ibuprofen. Hycodan or Tessalon as needed for cough. Mupirocin ointment to help with nasal friability. Follow-up if not improving in the next 2-4 days.    Melony Overly, MD 07/17/15 [phone]

## 2015-07-17 NOTE — ED Notes (Signed)
Pt states he is unable to afford the $30 Bactroban Rx.  Requesting alternative.  Per Dr. Bridgett Larsson: pt may just use Vaseline, as he does not need the abx.  Pt notified and verbalized understanding.

## 2015-07-17 NOTE — Discharge Instructions (Signed)
You likely have the flu. Your x-ray is normal. Make sure you are getting plenty of rest and drinking lots of fluids. You can take Tylenol or ibuprofen as needed for body aches. Tessalon is a nondrowsy cough medicine that you can use 3 times a day as needed. This is $4 at [employer]. Hycodan is a cough medicine that has a narcotic in it. Do not drive while taking this medicine. Use the mupirocin ointment at bedtime for the next week to help with the nosebleeds. You should start to feel better in the next 2-4 days. Follow-up as needed.

## 2015-07-17 NOTE — ED Notes (Signed)
Complains of symptoms for 4 days.  Initially started with a sore throat, but that has subsided.  Patient developed nasal stuffiness, cough, body aches, and chest "inflamed" burning feeling.  Reports clear phlegm.  Intermittent nose bleeds with coughing

## 2015-09-06 ENCOUNTER — Encounter: Payer: Self-pay | Admitting: Internal Medicine

## 2015-10-21 ENCOUNTER — Ambulatory Visit (AMBULATORY_SURGERY_CENTER): Payer: Self-pay | Admitting: *Deleted

## 2015-10-21 VITALS — Ht 68.0 in | Wt 198.6 lb

## 2015-10-21 DIAGNOSIS — Z1211 Encounter for screening for malignant neoplasm of colon: Secondary | ICD-10-CM

## 2015-10-21 MED ORDER — NA SULFATE-K SULFATE-MG SULF 17.5-3.13-1.6 GM/177ML PO SOLN: ORAL | Status: DC

## 2015-10-21 NOTE — Progress Notes (Signed)
No allergies to eggs or soy. No problems with anesthesia.  Pt given Emmi instructions for colonoscopy  No oxygen use  No diet drug use  

## 2015-11-04 ENCOUNTER — Encounter: Payer: Self-pay | Admitting: Internal Medicine

## 2015-11-04 ENCOUNTER — Ambulatory Visit (AMBULATORY_SURGERY_CENTER): Payer: Medicaid Other | Admitting: Internal Medicine

## 2015-11-04 VITALS — BP 105/76 | HR 76 | Temp 96.6°F | Resp 11 | Wt 198.0 lb

## 2015-11-04 DIAGNOSIS — Z1211 Encounter for screening for malignant neoplasm of colon: Secondary | ICD-10-CM

## 2015-11-04 DIAGNOSIS — D125 Benign neoplasm of sigmoid colon: Secondary | ICD-10-CM | POA: Diagnosis not present

## 2015-11-04 MED ORDER — SODIUM CHLORIDE 0.9 % IV SOLN: 500.0000 mL | INTRAVENOUS | Status: DC

## 2015-11-04 NOTE — Progress Notes (Signed)
Called to room to assist during endoscopic procedure.  Patient ID and intended procedure confirmed with present staff. Received instructions for my participation in the procedure from the performing physician.  

## 2015-11-04 NOTE — Patient Instructions (Signed)
YOU HAD AN ENDOSCOPIC PROCEDURE TODAY AT THE [facility]:   Refer to the procedure report that was given to you for any specific questions about what was found during the examination.  If the procedure report does not answer your questions, please call your gastroenterologist to clarify.  If you requested that your care partner not be given the details of your procedure findings, then the procedure report has been included in a sealed envelope for you to review at your convenience later.  YOU SHOULD EXPECT: Some feelings of bloating in the abdomen. Passage of more gas than usual.  Walking can help get rid of the air that was put into your GI tract during the procedure and reduce the bloating. If you had a lower endoscopy (such as a colonoscopy or flexible sigmoidoscopy) you may notice spotting of blood in your stool or on the toilet paper. If you underwent a bowel prep for your procedure, you may not have a normal bowel movement for a few days.  Please Note:  You might notice some irritation and congestion in your nose or some drainage.  This is from the oxygen used during your procedure.  There is no need for concern and it should clear up in a day or so.  SYMPTOMS TO REPORT IMMEDIATELY:   Following lower endoscopy (colonoscopy or flexible sigmoidoscopy):  Excessive amounts of blood in the stool  Significant tenderness or worsening of abdominal pains  Swelling of the abdomen that is new, acute  Fever of 100F or higher   For urgent or emergent issues, a gastroenterologist can be reached at any hour by calling [phone].   DIET: Your first meal following the procedure should be a small meal and then it is ok to progress to your normal diet. Heavy or fried foods are harder to digest and may make you feel nauseous or bloated.  Likewise, meals heavy in dairy and vegetables can increase bloating.  Drink plenty of fluids but you should avoid alcoholic beverages for 24  hours.  ACTIVITY:  You should plan to take it easy for the rest of today and you should NOT DRIVE or use heavy machinery until tomorrow (because of the sedation medicines used during the test).    FOLLOW UP: Our staff will call the number listed on your records the next business day following your procedure to check on you and address any questions or concerns that you may have regarding the information given to you following your procedure. If we do not reach you, we will leave a message.  However, if you are feeling well and you are not experiencing any problems, there is no need to return our call.  We will assume that you have returned to your regular daily activities without incident.  If any biopsies were taken you will be contacted by phone or by letter within the next 1-3 weeks.  Please call us at [phone] if you have not heard about the biopsies in 3 weeks.    SIGNATURES/CONFIDENTIALITY: You and/or your care partner have signed paperwork which will be entered into your electronic medical record.  These signatures attest to the fact that that the information above on your After Visit Summary has been reviewed and is understood.  Full responsibility of the confidentiality of this discharge information lies with you and/or your care-partner. 

## 2015-11-04 NOTE — Op Note (Signed)
[place] Patient Name: Zachary Powers Procedure Date: 11/04/2015 9:02 AM MRN: [id number] Endoscopist: Jerene Bears , MD Age: 50 Date of Birth: [birth year] Gender: Male Procedure:                Colonoscopy Indications:              Screening for colorectal malignant neoplasm, This                            is the patient's first colonoscopy Medicines:                Monitored Anesthesia Care Procedure:                Pre-Anesthesia Assessment:                           - Prior to the procedure, a History and Physical                            was performed, and patient medications and                            allergies were reviewed. The patient's tolerance of                            previous anesthesia was also reviewed. The risks                            and benefits of the procedure and the sedation                            options and risks were discussed with the patient.                            All questions were answered, and informed consent                            was obtained. Prior Anticoagulants: The patient has                            taken no previous anticoagulant or antiplatelet                            agents. ASA Grade Assessment: II - A patient with                            mild systemic disease. After reviewing the risks                            and benefits, the patient was deemed in                            satisfactory condition to undergo the procedure.  After obtaining informed consent, the colonoscope                            was passed under direct vision. Throughout the                            procedure, the patient's blood pressure, pulse, and                            oxygen saturations were monitored continuously. The                            Model CF-HQ190L [id number]) scope was introduced                            through the anus and advanced to the the cecum,         identified by appendiceal orifice and ileocecal                            valve. The colonoscopy was performed without                            difficulty. The patient tolerated the procedure                            well. The quality of the bowel preparation was                            excellent. The ileocecal valve, appendiceal                            orifice, and rectum were photographed. Scope In: 9:11:51 AM Scope Out: 9:23:49 AM Scope Withdrawal Time: 0 hours 9 minutes 13 seconds  Total Procedure Duration: 0 hours 11 minutes 58 seconds  Findings:                 The digital rectal exam was normal.                           Two sessile polyps were found in the distal sigmoid                            colon. The polyps were 3 to 4 mm in size. These                            polyps were removed with a cold snare. Resection                            and retrieval were complete.                           Multiple small-mouthed diverticula were found from  ascending colon to sigmoid colon.                           The retroflexed view of the distal rectum and anal                            verge was normal and showed no anal or rectal                            abnormalities. Complications:            No immediate complications. Estimated Blood Loss:     Estimated blood loss: none. Impression:               - Two 3 to 4 mm polyps in the distal sigmoid colon,                            removed with a cold snare. Resected and retrieved.                           - Diverticulosis from ascending colon to sigmoid                            colon.                           - The distal rectum and anal verge are normal on                            retroflexion view. Recommendation:           - Patient has a contact number available for                            emergencies. The signs and symptoms of potential                            delayed  complications were discussed with the                            patient. Return to normal activities tomorrow.                            Written discharge instructions were provided to the                            patient.                           - Resume previous diet.                           - Continue present medications.                           - Await pathology results.                           -  Repeat colonoscopy is recommended for                            surveillance. The colonoscopy date will be                            determined after pathology results from today's                            exam become available for review. Jerene Bears, MD 11/04/2015 9:36:24 AM This report has been signed electronically.

## 2015-11-04 NOTE — Progress Notes (Signed)
Patient awakening,vss,report to rn 

## 2015-11-05 ENCOUNTER — Telehealth: Payer: Self-pay

## 2015-11-05 NOTE — Telephone Encounter (Signed)
  Follow up Call-  Call back number 11/04/2015  Post procedure Call Back phone  # [phone]  Permission to leave phone message Yes    Patient was called to follow up after his procedure on 11/04/2015. No answer at the number given for follow up phone call. A message was left on the answering machine.

## 2015-11-15 ENCOUNTER — Encounter: Payer: Self-pay | Admitting: Internal Medicine

## 2016-04-04 ENCOUNTER — Encounter (HOSPITAL_COMMUNITY): Payer: Self-pay

## 2016-04-04 ENCOUNTER — Emergency Department (HOSPITAL_COMMUNITY): Payer: No Typology Code available for payment source

## 2016-04-04 ENCOUNTER — Emergency Department (HOSPITAL_COMMUNITY)
Admission: EM | Admit: 2016-04-04 | Discharge: 2016-04-04 | Disposition: A | Discharge status: Home / Self Care | Payer: No Typology Code available for payment source | Attending: Emergency Medicine | Admitting: Emergency Medicine

## 2016-04-04 DIAGNOSIS — Z7982 Long term (current) use of aspirin: Secondary | ICD-10-CM | POA: Insufficient documentation

## 2016-04-04 DIAGNOSIS — Y9389 Activity, other specified: Secondary | ICD-10-CM | POA: Diagnosis not present

## 2016-04-04 DIAGNOSIS — F17210 Nicotine dependence, cigarettes, uncomplicated: Secondary | ICD-10-CM | POA: Diagnosis not present

## 2016-04-04 DIAGNOSIS — S99911A Unspecified injury of right ankle, initial encounter: Secondary | ICD-10-CM | POA: Diagnosis present

## 2016-04-04 DIAGNOSIS — S39012A Strain of muscle, fascia and tendon of lower back, initial encounter: Secondary | ICD-10-CM | POA: Diagnosis not present

## 2016-04-04 DIAGNOSIS — J45909 Unspecified asthma, uncomplicated: Secondary | ICD-10-CM | POA: Insufficient documentation

## 2016-04-04 DIAGNOSIS — M542 Cervicalgia: Secondary | ICD-10-CM | POA: Insufficient documentation

## 2016-04-04 DIAGNOSIS — Y999 Unspecified external cause status: Secondary | ICD-10-CM | POA: Insufficient documentation

## 2016-04-04 DIAGNOSIS — Z791 Long term (current) use of non-steroidal anti-inflammatories (NSAID): Secondary | ICD-10-CM | POA: Insufficient documentation

## 2016-04-04 DIAGNOSIS — Y92410 Unspecified street and highway as the place of occurrence of the external cause: Secondary | ICD-10-CM | POA: Insufficient documentation

## 2016-04-04 DIAGNOSIS — Z79899 Other long term (current) drug therapy: Secondary | ICD-10-CM | POA: Diagnosis not present

## 2016-04-04 DIAGNOSIS — S93401A Sprain of unspecified ligament of right ankle, initial encounter: Secondary | ICD-10-CM | POA: Insufficient documentation

## 2016-04-04 MED ORDER — IBUPROFEN 600 MG PO TABS: 600.0000 mg | ORAL_TABLET | Freq: Four times a day (QID) | ORAL | 0 refills | Status: DC | PRN

## 2016-04-04 MED ORDER — METHOCARBAMOL 500 MG PO TABS: 500.0000 mg | ORAL_TABLET | Freq: Two times a day (BID) | ORAL | 0 refills | Status: DC

## 2016-04-04 NOTE — Discharge Instructions (Signed)
Take ibuprofen for pain, take Robaxin for muscle spasms. Ice her ankle, keep it elevated. Try heat to her back and neck. Follow-up with primary care doctor if not improving.
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
## 2016-04-07 ENCOUNTER — Emergency Department (HOSPITAL_COMMUNITY): Payer: No Typology Code available for payment source

## 2016-04-07 ENCOUNTER — Encounter (HOSPITAL_COMMUNITY): Payer: Self-pay

## 2016-04-07 ENCOUNTER — Emergency Department (HOSPITAL_COMMUNITY)
Admission: EM | Admit: 2016-04-07 | Discharge: 2016-04-07 | Disposition: A | Discharge status: Home / Self Care | Payer: No Typology Code available for payment source | Attending: Emergency Medicine | Admitting: Emergency Medicine

## 2016-04-07 DIAGNOSIS — J45909 Unspecified asthma, uncomplicated: Secondary | ICD-10-CM | POA: Diagnosis not present

## 2016-04-07 DIAGNOSIS — Y9389 Activity, other specified: Secondary | ICD-10-CM | POA: Diagnosis not present

## 2016-04-07 DIAGNOSIS — Y999 Unspecified external cause status: Secondary | ICD-10-CM | POA: Insufficient documentation

## 2016-04-07 DIAGNOSIS — M25571 Pain in right ankle and joints of right foot: Secondary | ICD-10-CM | POA: Diagnosis not present

## 2016-04-07 DIAGNOSIS — M542 Cervicalgia: Secondary | ICD-10-CM | POA: Diagnosis not present

## 2016-04-07 DIAGNOSIS — M545 Low back pain: Secondary | ICD-10-CM | POA: Diagnosis not present

## 2016-04-07 DIAGNOSIS — Z791 Long term (current) use of non-steroidal anti-inflammatories (NSAID): Secondary | ICD-10-CM | POA: Insufficient documentation

## 2016-04-07 DIAGNOSIS — F17210 Nicotine dependence, cigarettes, uncomplicated: Secondary | ICD-10-CM | POA: Diagnosis not present

## 2016-04-07 DIAGNOSIS — Z79899 Other long term (current) drug therapy: Secondary | ICD-10-CM | POA: Diagnosis not present

## 2016-04-07 DIAGNOSIS — Y92410 Unspecified street and highway as the place of occurrence of the external cause: Secondary | ICD-10-CM | POA: Insufficient documentation

## 2016-04-07 DIAGNOSIS — Z7982 Long term (current) use of aspirin: Secondary | ICD-10-CM | POA: Diagnosis not present

## 2016-04-07 DIAGNOSIS — M25511 Pain in right shoulder: Secondary | ICD-10-CM | POA: Insufficient documentation

## 2016-04-07 MED ORDER — HYDROCODONE-ACETAMINOPHEN 5-325 MG PO TABS
1.0000 | ORAL_TABLET | Freq: Once | ORAL | Status: AC
  Administered 2016-04-07: 1 via ORAL
  Filled 2016-04-07: qty 1

## 2016-04-07 NOTE — Discharge Instructions (Addendum)
Read the information below.   Your x-ray was re-assuring. Continue to take the motrin and robaxin. Ice ankle for 20 minute increments. You were given a brace and crutches, use for the next 2-3 days.  You can apply ice to affected areas or try warm showers to relieve sore muscles.  I want you to follow up with your primary doctor early next week for re-evaluation.  You may return to the Emergency Department at any time for worsening condition or any new symptoms that concern you. Return to ED if you develop fever, loss of bowel or bladder function, weakness, facial droop, slurred speech, changes in vision, chest pain, or shortness of breath.
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
## 2016-04-07 NOTE — ED Notes (Signed)
Patient transported to X-ray 
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
## 2017-10-17 ENCOUNTER — Other Ambulatory Visit (HOSPITAL_COMMUNITY): Payer: Self-pay | Admitting: Internal Medicine

## 2017-10-17 DIAGNOSIS — Z8679 Personal history of other diseases of the circulatory system: Secondary | ICD-10-CM

## 2017-10-18 ENCOUNTER — Inpatient Hospital Stay (HOSPITAL_COMMUNITY): Admission: RE | Admit: 2017-10-18 | Payer: Self-pay | Source: Ambulatory Visit

## 2017-11-14 ENCOUNTER — Other Ambulatory Visit: Payer: Self-pay

## 2017-11-14 DIAGNOSIS — I6523 Occlusion and stenosis of bilateral carotid arteries: Secondary | ICD-10-CM

## 2017-11-22 ENCOUNTER — Ambulatory Visit (HOSPITAL_COMMUNITY)
Admission: RE | Admit: 2017-11-22 | Discharge: 2017-11-22 | Disposition: A | Discharge status: Home / Self Care | Payer: Medicaid Other | Source: Ambulatory Visit | Attending: Vascular Surgery | Admitting: Vascular Surgery

## 2017-11-22 DIAGNOSIS — I6523 Occlusion and stenosis of bilateral carotid arteries: Secondary | ICD-10-CM | POA: Insufficient documentation

## 2017-11-22 DIAGNOSIS — F17200 Nicotine dependence, unspecified, uncomplicated: Secondary | ICD-10-CM | POA: Diagnosis not present

## 2018-04-03 NOTE — Progress Notes (Signed)
Please place orders in Epic as patient is being scheduled for a pre-op appointment! Thank you! 

## 2018-04-04 ENCOUNTER — Ambulatory Visit: Payer: Self-pay | Admitting: Orthopedic Surgery

## 2018-04-04 NOTE — Patient Instructions (Addendum)
Zachary Powers  04/04/2018   Your procedure is scheduled on: 04-10-18   Report to [facility] Main  Entrance    Report to Admitting at 8:00 AM    Call this number if you have problems the morning of surgery [phone]   Remember: Do not eat food or drink liquids :After Midnight.     Take these medicines the morning of surgery with A SIP OF WATER: You can take your pain medication as needed.                                 You may not have any metal on your body including hair pins and              piercings  Do not wear jewelry, lotions, powders, cologne or deodorant             Men may shave face and neck.   Do not bring valuables to the hospital. [place].  Contacts, dentures or bridgework may not be worn into surgery.  Leave suitcase in the car. After surgery it may be brought to your room.     Special Instructions: N/A              Please read over the following fact sheets you were given: _____________________________________________________________________             [facility] - Preparing for Surgery Before surgery, you can play an important role.  Because skin is not sterile, your skin needs to be as free of germs as possible.  You can reduce the number of germs on your skin by washing with CHG (chlorahexidine gluconate) soap before surgery.  CHG is an antiseptic cleaner which kills germs and bonds with the skin to continue killing germs even after washing. Please DO NOT use if you have an allergy to CHG or antibacterial soaps.  If your skin becomes reddened/irritated stop using the CHG and inform your nurse when you arrive at Short Stay. Do not shave (including legs and underarms) for at least 48 hours prior to the first CHG shower.  You may shave your face/neck. Please follow these instructions carefully:  1.  Shower with CHG Soap the night before surgery and the  morning of Surgery.  2.  If  you choose to wash your hair, wash your hair first as usual with your  normal  shampoo.  3.  After you shampoo, rinse your hair and body thoroughly to remove the  shampoo.                           4.  Use CHG as you would any other liquid soap.  You can apply chg directly  to the skin and wash                       Gently with a scrungie or clean washcloth.  5.  Apply the CHG Soap to your body ONLY FROM THE NECK DOWN.   Do not use on face/ open  Wound or open sores. Avoid contact with eyes, ears mouth and genitals (private parts).                       Wash face,  Genitals (private parts) with your normal soap.             6.  Wash thoroughly, paying special attention to the area where your surgery  will be performed.  7.  Thoroughly rinse your body with warm water from the neck down.  8.  DO NOT shower/wash with your normal soap after using and rinsing off  the CHG Soap.                9.  Pat yourself dry with a clean towel.            10.  Wear clean pajamas.            11.  Place clean sheets on your bed the night of your first shower and do not  sleep with pets. Day of Surgery : Do not apply any lotions/deodorants the morning of surgery.  Please wear clean clothes to the hospital/surgery center.  FAILURE TO FOLLOW THESE INSTRUCTIONS MAY RESULT IN THE CANCELLATION OF YOUR SURGERY PATIENT SIGNATURE_________________________________  NURSE SIGNATURE__________________________________  ________________________________________________________________________   Zachary Powers  An incentive spirometer is a tool that can help keep your lungs clear and active. This tool measures how well you are filling your lungs with each breath. Taking long deep breaths may help reverse or decrease the chance of developing breathing (pulmonary) problems (especially infection) following:  A long period of time when you are unable to move or be active. BEFORE THE PROCEDURE    If the spirometer includes an indicator to show your best effort, your nurse or respiratory therapist will set it to a desired goal.  If possible, sit up straight or lean slightly forward. Try not to slouch.  Hold the incentive spirometer in an upright position. INSTRUCTIONS FOR USE  1. Sit on the edge of your bed if possible, or sit up as far as you can in bed or on a chair. 2. Hold the incentive spirometer in an upright position. 3. Breathe out normally. 4. Place the mouthpiece in your mouth and seal your lips tightly around it. 5. Breathe in slowly and as deeply as possible, raising the piston or the ball toward the top of the column. 6. Hold your breath for 3-5 seconds or for as long as possible. Allow the piston or ball to fall to the bottom of the column. 7. Remove the mouthpiece from your mouth and breathe out normally. 8. Rest for a few seconds and repeat Steps 1 through 7 at least 10 times every 1-2 hours when you are awake. Take your time and take a few normal breaths between deep breaths. 9. The spirometer may include an indicator to show your best effort. Use the indicator as a goal to work toward during each repetition. 10. After each set of 10 deep breaths, practice coughing to be sure your lungs are clear. If you have an incision (the cut made at the time of surgery), support your incision when coughing by placing a pillow or rolled up towels firmly against it. Once you are able to get out of bed, walk around indoors and cough well. You may stop using the incentive spirometer when instructed by your caregiver.  RISKS AND COMPLICATIONS  Take your time so you do not get  dizzy or light-headed.  If you are in pain, you may need to take or ask for pain medication before doing incentive spirometry. It is harder to take a deep breath if you are having pain. AFTER USE  Rest and breathe slowly and easily.  It can be helpful to keep track of a log of your progress. Your caregiver  can provide you with a simple table to help with this. If you are using the spirometer at home, follow these instructions: [place] IF:   You are having difficultly using the spirometer.  You have trouble using the spirometer as often as instructed.  Your pain medication is not giving enough relief while using the spirometer.  You develop fever of 100.5 F (38.1 C) or higher. SEEK IMMEDIATE MEDICAL CARE IF:   You cough up bloody sputum that had not been present before.  You develop fever of 102 F (38.9 C) or greater.  You develop worsening pain at or near the incision site. MAKE SURE YOU:   Understand these instructions.  Will watch your condition.  Will get help right away if you are not doing well or get worse. Document Released: 11/27/2006 Document Revised: 10/09/2011 Document Reviewed: 01/28/2007 [facility] Patient Information 2014 [place], [place].   ________________________________________________________________________

## 2018-04-05 ENCOUNTER — Encounter (HOSPITAL_COMMUNITY)
Admission: RE | Admit: 2018-04-05 | Discharge: 2018-04-05 | Disposition: A | Discharge status: Home / Self Care | Payer: Worker's Compensation | Source: Ambulatory Visit | Attending: Specialist | Admitting: Specialist

## 2018-04-05 ENCOUNTER — Encounter (HOSPITAL_COMMUNITY): Payer: Self-pay

## 2018-04-05 ENCOUNTER — Other Ambulatory Visit: Payer: Self-pay

## 2018-04-05 DIAGNOSIS — X58XXXA Exposure to other specified factors, initial encounter: Secondary | ICD-10-CM | POA: Diagnosis not present

## 2018-04-05 DIAGNOSIS — M2242 Chondromalacia patellae, left knee: Secondary | ICD-10-CM | POA: Insufficient documentation

## 2018-04-05 DIAGNOSIS — S83207A Unspecified tear of unspecified meniscus, current injury, left knee, initial encounter: Secondary | ICD-10-CM | POA: Insufficient documentation

## 2018-04-05 DIAGNOSIS — I10 Essential (primary) hypertension: Secondary | ICD-10-CM | POA: Insufficient documentation

## 2018-04-05 DIAGNOSIS — Z01818 Encounter for other preprocedural examination: Secondary | ICD-10-CM | POA: Insufficient documentation

## 2018-04-05 DIAGNOSIS — S86812A Strain of other muscle(s) and tendon(s) at lower leg level, left leg, initial encounter: Secondary | ICD-10-CM | POA: Diagnosis not present

## 2018-04-05 HISTORY — DX: Essential (primary) hypertension: I10

## 2018-04-05 LAB — CBC
HCT: 46.7 % (ref 39.0–52.0)
Hemoglobin: 15.7 g/dL (ref 13.0–17.0)
MCH: 28.1 pg (ref 26.0–34.0)
MCHC: 33.6 g/dL (ref 30.0–36.0)
MCV: 83.5 fL (ref 78.0–100.0)
PLATELETS: 229 10*3/uL (ref 150–400)
RBC: 5.59 MIL/uL (ref 4.22–5.81)
RDW: 13.9 % (ref 11.5–15.5)
WBC: 8 10*3/uL (ref 4.0–10.5)

## 2018-04-05 LAB — BASIC METABOLIC PANEL
Anion gap: 8 (ref 5–15)
BUN: 12 mg/dL (ref 6–20)
CALCIUM: 9.3 mg/dL (ref 8.9–10.3)
CO2: 27 mmol/L (ref 22–32)
CREATININE: 1.08 mg/dL (ref 0.61–1.24)
Chloride: 107 mmol/L (ref 98–111)
GFR calc non Af Amer: >60 mL/min (ref >60)
Glucose, Bld: 118 mg/dL — ABNORMAL HIGH (ref 70–99)
Potassium: 4.1 mmol/L (ref 3.5–5.1)
Sodium: 142 mmol/L (ref 135–145)

## 2018-04-09 ENCOUNTER — Ambulatory Visit: Payer: Self-pay | Admitting: Orthopedic Surgery

## 2018-04-09 NOTE — H&P (Signed)
Zachary Powers is an 52 y.o. male.   Chief Complaint: L knee pain HPI: Visit For: Follow up; The patient follows up to discuss left knee MRI. Location: left; knee Duration: 3 weeks; DOI 03/01/18 Severity: pain level 7/10 Timing: acute Work Related: yes; The patient is out of work.  Past Medical History:  Diagnosis Date  . Anxiety   . Asthma   . Back pain   . Depression   . GERD (gastroesophageal reflux disease)   . Hypertension     Past Surgical History:  Procedure Laterality Date  . HAND SURGERY Left 1991    Family History  Problem Relation Age of Onset  . Diabetes Father   . Heart disease Father   . Diabetes Paternal Grandmother   . Colon cancer Neg Hx    Social History:  reports that he has been smoking cigarettes. He has been smoking about 1.00 pack per day. He has never used smokeless tobacco. He reports that he does not drink alcohol or use drugs.  Allergies:  Allergies  Allergen Reactions  . Barbiturates     Recovering addict   Medications: Ativan 0.5 mg tablet losartan 100 mg tablet naproxen 500 mg tablet tiZANidine 4 mg tablet  Review of Systems  Constitutional: Negative.   HENT: Negative.   Eyes: Negative.   Respiratory: Negative.   Cardiovascular: Negative.   Gastrointestinal: Negative.   Genitourinary: Negative.   Musculoskeletal: Positive for joint pain.  Skin: Negative.   Neurological: Negative.   Psychiatric/Behavioral: Negative.     There were no vitals taken for this visit. Physical Exam  Constitutional: He is oriented to person, place, and time. He appears well-developed and well-nourished.  HENT:  Head: Normocephalic.  Eyes: Pupils are equal, round, and reactive to light.  Neck: Normal range of motion.  Cardiovascular: Normal rate.  Respiratory: Effort normal.  GI: Soft.  Musculoskeletal:  Patient is a 52 year old male.  Patient is exquisitely tender to palpation insertion of the quad tendon into the superior pole of patella.  He is a moderate knee effusion. He has patellofemoral pain compression. He is mild medial joint tenderness.  He is unable to actively extend the knee fully. I am able to passively extend the knee. He has no instability varus valgus stress in 0/30. If so hip and ankle exam is unremarkable. Pulses are intact.  No instability hips knees and ankles.  Neurological: He is alert and oriented to person, place, and time.  Skin: Skin is warm and dry.    MRI reviewed demonstrates a severe partial tear of the quadriceps tendon. Essentially a full-thickness tear. This is extending into the vastus medialis oblique muscle attachment. A small peripheral tear of the medial meniscus. 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  Assessment/Plan Patient demonstrates persistent of pain within the quadriceps tendon and given his mechanism of injury and clinical exam an inability to fully extend the knee and his MRI findings as likely as a full-thickness tear at significant of the insertion of the quadriceps tendon.  In addition intra-articular a he is has significant chondromalacia of the patella. Of a peripheral meniscus tear that most probably is not symptomatic.  Discussed options for him at this point in time including living with his symptoms versus surgical repair of the quadriceps tendon.  Mutually agreed to proceed with a repair.  Condition may be beneficial to perform an arthroscopy to evaluate the menisci perform a chondroplasty and debride the joint he does have a large effusion  associated with that. And to assess the degree of his tear.  Discussed a mini open quad tendon repair. 2 avenues drill holes through the patella versus insertion of suture anchors similarly to that done with the rotator cuff repair. And using fiber tape. Especially if there is is a partial quad tendon tear.  Discussed outpatient versus overnight. Risk of infection DVT. Utilization of aspirin afterwards. He has no  history of DVT or infection.  In addition he reports leg giving way at this point in time. To prevent an event which generates further tearing of the quadriceps tendon I recommend Place him in a T rom brace locked in extension today. That can be liberalized postoperatively for range of motion.  He will most likely require at least 6 weeks of avoiding weightbearing on the bent knee. 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  Certainly light duty sedentary may be a consideration in the meantime  I would recommend though at this point given the instability in his knee and the requirement for bracing crutches to be out of work.  Discussed this with the patient with his case manager Virl Axe.  In addition I discussed the requirement for tobacco cessation indicating the deleterious side effects upon healing. We will utilize Ativan when necessary for anxiety associated with a tobacco cessation.  We will proceed accordingly.  Plan left knee arthroscopy, debridement, chondroplasty patella, possible meniscectomy, mini-open quad tendon repair  Cecilie Kicks., PA-C for Dr. Tonita Cong 04/09/2018, 8:45 AM

## 2018-04-09 NOTE — H&P (View-Only) (Signed)
Zachary Powers is an 52 y.o. male.   Chief Complaint: L knee pain HPI: Visit For: Follow up; The patient follows up to discuss left knee MRI. Location: left; knee Duration: 3 weeks; DOI 03/01/18 Severity: pain level 7/10 Timing: acute Work Related: yes; The patient is out of work.  Past Medical History:  Diagnosis Date  . Anxiety   . Asthma   . Back pain   . Depression   . GERD (gastroesophageal reflux disease)   . Hypertension     Past Surgical History:  Procedure Laterality Date  . HAND SURGERY Left 1991    Family History  Problem Relation Age of Onset  . Diabetes Father   . Heart disease Father   . Diabetes Paternal Grandmother   . Colon cancer Neg Hx    Social History:  reports that he has been smoking cigarettes. He has been smoking about 1.00 pack per day. He has never used smokeless tobacco. He reports that he does not drink alcohol or use drugs.  Allergies:  Allergies  Allergen Reactions  . Barbiturates     Recovering addict   Medications: Ativan 0.5 mg tablet losartan 100 mg tablet naproxen 500 mg tablet tiZANidine 4 mg tablet  Review of Systems  Constitutional: Negative.   HENT: Negative.   Eyes: Negative.   Respiratory: Negative.   Cardiovascular: Negative.   Gastrointestinal: Negative.   Genitourinary: Negative.   Musculoskeletal: Positive for joint pain.  Skin: Negative.   Neurological: Negative.   Psychiatric/Behavioral: Negative.     There were no vitals taken for this visit. Physical Exam  Constitutional: He is oriented to person, place, and time. He appears well-developed and well-nourished.  HENT:  Head: Normocephalic.  Eyes: Pupils are equal, round, and reactive to light.  Neck: Normal range of motion.  Cardiovascular: Normal rate.  Respiratory: Effort normal.  GI: Soft.  Musculoskeletal:  Patient is a 52 year old male.  Patient is exquisitely tender to palpation insertion of the quad tendon into the superior pole of patella.  He is a moderate knee effusion. He has patellofemoral pain compression. He is mild medial joint tenderness.  He is unable to actively extend the knee fully. I am able to passively extend the knee. He has no instability varus valgus stress in 0/30. If so hip and ankle exam is unremarkable. Pulses are intact.  No instability hips knees and ankles.  Neurological: He is alert and oriented to person, place, and time.  Skin: Skin is warm and dry.    MRI reviewed demonstrates a severe partial tear of the quadriceps tendon. Essentially a full-thickness tear. This is extending into the vastus medialis oblique muscle attachment. A small peripheral tear of the medial meniscus. 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  Assessment/Plan Patient demonstrates persistent of pain within the quadriceps tendon and given his mechanism of injury and clinical exam an inability to fully extend the knee and his MRI findings as likely as a full-thickness tear at significant of the insertion of the quadriceps tendon.  In addition intra-articular a he is has significant chondromalacia of the patella. Of a peripheral meniscus tear that most probably is not symptomatic.  Discussed options for him at this point in time including living with his symptoms versus surgical repair of the quadriceps tendon.  Mutually agreed to proceed with a repair.  Condition may be beneficial to perform an arthroscopy to evaluate the menisci perform a chondroplasty and debride the joint he does have a large effusion  associated with that. And to assess the degree of his tear.  Discussed a mini open quad tendon repair. 2 avenues drill holes through the patella versus insertion of suture anchors similarly to that done with the rotator cuff repair. And using fiber tape. Especially if there is is a partial quad tendon tear.  Discussed outpatient versus overnight. Risk of infection DVT. Utilization of aspirin afterwards. He has no  history of DVT or infection.  In addition he reports leg giving way at this point in time. To prevent an event which generates further tearing of the quadriceps tendon I recommend Place him in a T rom brace locked in extension today. That can be liberalized postoperatively for range of motion.  He will most likely require at least 6 weeks of avoiding weightbearing on the bent knee. 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  Certainly light duty sedentary may be a consideration in the meantime  I would recommend though at this point given the instability in his knee and the requirement for bracing crutches to be out of work.  Discussed this with the patient with his case manager Virl Axe.  In addition I discussed the requirement for tobacco cessation indicating the deleterious side effects upon healing. We will utilize Ativan when necessary for anxiety associated with a tobacco cessation.  We will proceed accordingly.  Plan left knee arthroscopy, debridement, chondroplasty patella, possible meniscectomy, mini-open quad tendon repair  Cecilie Kicks., PA-C for Dr. Tonita Cong 04/09/2018, 8:45 AM

## 2018-04-10 ENCOUNTER — Other Ambulatory Visit: Payer: Self-pay

## 2018-04-10 ENCOUNTER — Encounter (HOSPITAL_COMMUNITY)
Admission: RE | Disposition: A | Discharge status: Home / Self Care | Payer: Self-pay | Source: Ambulatory Visit | Attending: Specialist

## 2018-04-10 ENCOUNTER — Encounter (HOSPITAL_COMMUNITY): Payer: Self-pay | Admitting: General Practice

## 2018-04-10 ENCOUNTER — Ambulatory Visit (HOSPITAL_COMMUNITY)
Admission: RE | Admit: 2018-04-10 | Discharge: 2018-04-12 | Disposition: A | Discharge status: Home / Self Care | Payer: Worker's Compensation | Source: Ambulatory Visit | Attending: Specialist | Admitting: Specialist

## 2018-04-10 ENCOUNTER — Ambulatory Visit (HOSPITAL_COMMUNITY): Payer: Worker's Compensation | Admitting: Anesthesiology

## 2018-04-10 DIAGNOSIS — S76112A Strain of left quadriceps muscle, fascia and tendon, initial encounter: Secondary | ICD-10-CM | POA: Insufficient documentation

## 2018-04-10 DIAGNOSIS — Z79899 Other long term (current) drug therapy: Secondary | ICD-10-CM | POA: Diagnosis not present

## 2018-04-10 DIAGNOSIS — I10 Essential (primary) hypertension: Secondary | ICD-10-CM | POA: Insufficient documentation

## 2018-04-10 DIAGNOSIS — F419 Anxiety disorder, unspecified: Secondary | ICD-10-CM | POA: Insufficient documentation

## 2018-04-10 DIAGNOSIS — F17210 Nicotine dependence, cigarettes, uncomplicated: Secondary | ICD-10-CM | POA: Insufficient documentation

## 2018-04-10 DIAGNOSIS — I472 Ventricular tachycardia: Secondary | ICD-10-CM | POA: Insufficient documentation

## 2018-04-10 DIAGNOSIS — Z791 Long term (current) use of non-steroidal anti-inflammatories (NSAID): Secondary | ICD-10-CM | POA: Diagnosis not present

## 2018-04-10 DIAGNOSIS — X58XXXA Exposure to other specified factors, initial encounter: Secondary | ICD-10-CM | POA: Insufficient documentation

## 2018-04-10 DIAGNOSIS — M94262 Chondromalacia, left knee: Secondary | ICD-10-CM | POA: Diagnosis not present

## 2018-04-10 DIAGNOSIS — F329 Major depressive disorder, single episode, unspecified: Secondary | ICD-10-CM | POA: Diagnosis not present

## 2018-04-10 DIAGNOSIS — S86819A Strain of other muscle(s) and tendon(s) at lower leg level, unspecified leg, initial encounter: Secondary | ICD-10-CM | POA: Diagnosis present

## 2018-04-10 DIAGNOSIS — S83242A Other tear of medial meniscus, current injury, left knee, initial encounter: Secondary | ICD-10-CM | POA: Insufficient documentation

## 2018-04-10 HISTORY — PX: QUADRICEPS TENDON REPAIR: SHX756

## 2018-04-10 HISTORY — PX: KNEE ARTHROSCOPY: SHX127

## 2018-04-10 SURGERY — ARTHROSCOPY, KNEE
Anesthesia: General | Site: Knee | Laterality: Left

## 2018-04-10 MED ORDER — EPINEPHRINE PF 1 MG/ML IJ SOLN
INTRAMUSCULAR | Status: AC
  Filled 2018-04-10: qty 1

## 2018-04-10 MED ORDER — FENTANYL CITRATE (PF) 100 MCG/2ML IJ SOLN
INTRAMUSCULAR | Status: AC
  Filled 2018-04-10: qty 2

## 2018-04-10 MED ORDER — EPINEPHRINE PF 1 MG/ML IJ SOLN
INTRAMUSCULAR | Status: DC | PRN
  Administered 2018-04-10: 2 mg

## 2018-04-10 MED ORDER — HYDRALAZINE HCL 20 MG/ML IJ SOLN
INTRAMUSCULAR | Status: AC
  Administered 2018-04-10: 10 mg
  Filled 2018-04-10: qty 1

## 2018-04-10 MED ORDER — LACTATED RINGERS IV SOLN
INTRAVENOUS | Status: DC
  Administered 2018-04-10 (×2): via INTRAVENOUS

## 2018-04-10 MED ORDER — CHLORHEXIDINE GLUCONATE 4 % EX LIQD: 60.0000 mL | Freq: Once | CUTANEOUS | Status: DC

## 2018-04-10 MED ORDER — KETOROLAC TROMETHAMINE 30 MG/ML IJ SOLN
INTRAMUSCULAR | Status: DC | PRN
  Administered 2018-04-10: 30 mg via INTRAVENOUS

## 2018-04-10 MED ORDER — MIDAZOLAM HCL 5 MG/5ML IJ SOLN
INTRAMUSCULAR | Status: DC | PRN
  Administered 2018-04-10: 2 mg via INTRAVENOUS

## 2018-04-10 MED ORDER — KCL IN DEXTROSE-NACL 20-5-0.45 MEQ/L-%-% IV SOLN
INTRAVENOUS | Status: AC
  Administered 2018-04-10: 17:00:00 via INTRAVENOUS
  Filled 2018-04-10 (×2): qty 1000

## 2018-04-10 MED ORDER — ONDANSETRON HCL 4 MG/2ML IJ SOLN
INTRAMUSCULAR | Status: DC | PRN
  Administered 2018-04-10: 4 mg via INTRAVENOUS

## 2018-04-10 MED ORDER — FENTANYL CITRATE (PF) 100 MCG/2ML IJ SOLN
25.0000 ug | INTRAMUSCULAR | Status: DC | PRN
  Administered 2018-04-10 (×2): 50 ug via INTRAVENOUS

## 2018-04-10 MED ORDER — LIDOCAINE 2% (20 MG/ML) 5 ML SYRINGE
INTRAMUSCULAR | Status: AC
  Filled 2018-04-10: qty 5

## 2018-04-10 MED ORDER — ONDANSETRON HCL 4 MG/2ML IJ SOLN
INTRAMUSCULAR | Status: AC
  Filled 2018-04-10: qty 2

## 2018-04-10 MED ORDER — FENTANYL CITRATE (PF) 100 MCG/2ML IJ SOLN
25.0000 ug | INTRAMUSCULAR | Status: DC | PRN
  Administered 2018-04-10 (×3): 50 ug via INTRAVENOUS

## 2018-04-10 MED ORDER — MENTHOL 3 MG MT LOZG
1.0000 | LOZENGE | OROMUCOSAL | Status: DC | PRN
  Filled 2018-04-10: qty 9

## 2018-04-10 MED ORDER — MAGNESIUM CITRATE PO SOLN: 1.0000 | Freq: Once | ORAL | Status: DC | PRN

## 2018-04-10 MED ORDER — ONDANSETRON HCL 4 MG PO TABS: 4.0000 mg | ORAL_TABLET | Freq: Four times a day (QID) | ORAL | Status: DC | PRN

## 2018-04-10 MED ORDER — PHENOL 1.4 % MT LIQD: 1.0000 | OROMUCOSAL | Status: DC | PRN

## 2018-04-10 MED ORDER — GLYCOPYRROLATE PF 0.2 MG/ML IJ SOSY
PREFILLED_SYRINGE | INTRAMUSCULAR | Status: DC | PRN
  Administered 2018-04-10: .1 mg via INTRAVENOUS

## 2018-04-10 MED ORDER — DIPHENHYDRAMINE HCL 12.5 MG/5ML PO ELIX
12.5000 mg | ORAL_SOLUTION | ORAL | Status: DC | PRN
  Administered 2018-04-11: 25 mg via ORAL
  Administered 2018-04-12: 12.5 mg via ORAL
  Administered 2018-04-12: 25 mg via ORAL
  Filled 2018-04-10 (×3): qty 10
  Filled 2018-04-10: qty 5

## 2018-04-10 MED ORDER — PHENYLEPHRINE HCL 10 MG/ML IJ SOLN
INTRAMUSCULAR | Status: AC
  Filled 2018-04-10: qty 2

## 2018-04-10 MED ORDER — ONDANSETRON HCL 4 MG/2ML IJ SOLN: 4.0000 mg | Freq: Four times a day (QID) | INTRAMUSCULAR | Status: DC | PRN

## 2018-04-10 MED ORDER — ALUM & MAG HYDROXIDE-SIMETH 200-200-20 MG/5ML PO SUSP
30.0000 mL | ORAL | Status: DC | PRN
  Administered 2018-04-10: 30 mL via ORAL
  Filled 2018-04-10: qty 30

## 2018-04-10 MED ORDER — LIDOCAINE 2% (20 MG/ML) 5 ML SYRINGE
INTRAMUSCULAR | Status: DC | PRN
  Administered 2018-04-10: 80 mg via INTRAVENOUS

## 2018-04-10 MED ORDER — ADULT MULTIVITAMIN W/MINERALS CH
1.0000 | ORAL_TABLET | Freq: Every day | ORAL | Status: DC
  Administered 2018-04-11 – 2018-04-12 (×2): 1 via ORAL
  Filled 2018-04-10 (×2): qty 1

## 2018-04-10 MED ORDER — METOCLOPRAMIDE HCL 5 MG PO TABS: 5.0000 mg | ORAL_TABLET | Freq: Three times a day (TID) | ORAL | Status: DC | PRN

## 2018-04-10 MED ORDER — DEXAMETHASONE SODIUM PHOSPHATE 10 MG/ML IJ SOLN
INTRAMUSCULAR | Status: AC
  Filled 2018-04-10: qty 1

## 2018-04-10 MED ORDER — BUPIVACAINE-EPINEPHRINE 0.5% -1:200000 IJ SOLN
INTRAMUSCULAR | Status: DC | PRN
  Administered 2018-04-10: 15 mL

## 2018-04-10 MED ORDER — FENTANYL CITRATE (PF) 100 MCG/2ML IJ SOLN
INTRAMUSCULAR | Status: DC | PRN
  Administered 2018-04-10 (×3): 50 ug via INTRAVENOUS
  Administered 2018-04-10: 25 ug via INTRAVENOUS
  Administered 2018-04-10: 50 ug via INTRAVENOUS
  Administered 2018-04-10 (×3): 25 ug via INTRAVENOUS

## 2018-04-10 MED ORDER — OXYCODONE-ACETAMINOPHEN 5-325 MG PO TABS: 1.0000 | ORAL_TABLET | ORAL | 0 refills | Status: DC | PRN

## 2018-04-10 MED ORDER — BISACODYL 5 MG PO TBEC: 5.0000 mg | DELAYED_RELEASE_TABLET | Freq: Every day | ORAL | Status: DC | PRN

## 2018-04-10 MED ORDER — DEXAMETHASONE SODIUM PHOSPHATE 10 MG/ML IJ SOLN
INTRAMUSCULAR | Status: DC | PRN
  Administered 2018-04-10: 10 mg via INTRAVENOUS

## 2018-04-10 MED ORDER — LOSARTAN POTASSIUM 50 MG PO TABS
100.0000 mg | ORAL_TABLET | Freq: Every day | ORAL | Status: DC
  Administered 2018-04-11 – 2018-04-12 (×2): 100 mg via ORAL
  Filled 2018-04-10 (×2): qty 2

## 2018-04-10 MED ORDER — BUPIVACAINE-EPINEPHRINE (PF) 0.5% -1:200000 IJ SOLN
INTRAMUSCULAR | Status: AC
  Filled 2018-04-10: qty 30

## 2018-04-10 MED ORDER — ZOLPIDEM TARTRATE 10 MG PO TABS
10.0000 mg | ORAL_TABLET | Freq: Every evening | ORAL | Status: DC | PRN
  Administered 2018-04-10 – 2018-04-11 (×2): 10 mg via ORAL
  Filled 2018-04-10 (×2): qty 1

## 2018-04-10 MED ORDER — GLYCOPYRROLATE PF 0.2 MG/ML IJ SOSY
PREFILLED_SYRINGE | INTRAMUSCULAR | Status: AC
  Filled 2018-04-10: qty 1

## 2018-04-10 MED ORDER — OXYCODONE HCL 5 MG PO TABS
5.0000 mg | ORAL_TABLET | ORAL | Status: DC | PRN
  Administered 2018-04-10: 5 mg via ORAL
  Filled 2018-04-10: qty 2

## 2018-04-10 MED ORDER — ASPIRIN EC 325 MG PO TBEC
325.0000 mg | DELAYED_RELEASE_TABLET | Freq: Every day | ORAL | Status: DC
  Administered 2018-04-11 – 2018-04-12 (×2): 325 mg via ORAL
  Filled 2018-04-10 (×2): qty 1

## 2018-04-10 MED ORDER — PROPOFOL 10 MG/ML IV BOLUS
INTRAVENOUS | Status: AC
  Filled 2018-04-10: qty 40

## 2018-04-10 MED ORDER — METHOCARBAMOL 500 MG IVPB - SIMPLE MED
500.0000 mg | Freq: Four times a day (QID) | INTRAVENOUS | Status: DC | PRN
  Administered 2018-04-10: 500 mg via INTRAVENOUS
  Filled 2018-04-10: qty 50

## 2018-04-10 MED ORDER — OXYCODONE HCL 5 MG PO TABS
10.0000 mg | ORAL_TABLET | ORAL | Status: DC | PRN
  Administered 2018-04-10: 15 mg via ORAL
  Administered 2018-04-10: 10 mg via ORAL
  Administered 2018-04-11: 15 mg via ORAL
  Filled 2018-04-10 (×2): qty 3

## 2018-04-10 MED ORDER — HYDROCHLOROTHIAZIDE 12.5 MG PO CAPS
12.5000 mg | ORAL_CAPSULE | Freq: Once | ORAL | Status: AC
  Administered 2018-04-10: 12.5 mg via ORAL
  Filled 2018-04-10: qty 1

## 2018-04-10 MED ORDER — KETOROLAC TROMETHAMINE 30 MG/ML IJ SOLN
INTRAMUSCULAR | Status: AC
  Filled 2018-04-10: qty 1

## 2018-04-10 MED ORDER — METHOCARBAMOL 500 MG IVPB - SIMPLE MED
INTRAVENOUS | Status: AC
  Filled 2018-04-10: qty 50

## 2018-04-10 MED ORDER — HYDROMORPHONE HCL 1 MG/ML IJ SOLN
0.5000 mg | INTRAMUSCULAR | Status: DC | PRN
  Administered 2018-04-10: 1 mg via INTRAVENOUS
  Filled 2018-04-10: qty 1

## 2018-04-10 MED ORDER — OXYCODONE HCL 5 MG PO TABS
ORAL_TABLET | ORAL | Status: AC
  Filled 2018-04-10: qty 1

## 2018-04-10 MED ORDER — PHENYLEPHRINE 40 MCG/ML (10ML) SYRINGE FOR IV PUSH (FOR BLOOD PRESSURE SUPPORT)
PREFILLED_SYRINGE | INTRAVENOUS | Status: AC
  Filled 2018-04-10: qty 20

## 2018-04-10 MED ORDER — LORAZEPAM 0.5 MG PO TABS
0.5000 mg | ORAL_TABLET | Freq: Three times a day (TID) | ORAL | Status: DC | PRN
  Administered 2018-04-10: 0.5 mg via ORAL
  Filled 2018-04-10: qty 1

## 2018-04-10 MED ORDER — CEFAZOLIN SODIUM-DEXTROSE 2-4 GM/100ML-% IV SOLN
2.0000 g | INTRAVENOUS | Status: AC
  Administered 2018-04-10: 2 g via INTRAVENOUS
  Filled 2018-04-10: qty 100

## 2018-04-10 MED ORDER — CEFAZOLIN SODIUM-DEXTROSE 2-4 GM/100ML-% IV SOLN
2.0000 g | Freq: Four times a day (QID) | INTRAVENOUS | Status: AC
  Administered 2018-04-10: 2 g via INTRAVENOUS
  Filled 2018-04-10: qty 100

## 2018-04-10 MED ORDER — MIDAZOLAM HCL 2 MG/2ML IJ SOLN
INTRAMUSCULAR | Status: AC
  Filled 2018-04-10: qty 2

## 2018-04-10 MED ORDER — POLYETHYLENE GLYCOL 3350 17 G PO PACK: 17.0000 g | PACK | Freq: Every day | ORAL | Status: DC | PRN

## 2018-04-10 MED ORDER — DOCUSATE SODIUM 100 MG PO CAPS: 100.0000 mg | ORAL_CAPSULE | Freq: Two times a day (BID) | ORAL | 1 refills | Status: DC | PRN

## 2018-04-10 MED ORDER — DOCUSATE SODIUM 100 MG PO CAPS
100.0000 mg | ORAL_CAPSULE | Freq: Two times a day (BID) | ORAL | Status: DC
  Administered 2018-04-10 – 2018-04-12 (×4): 100 mg via ORAL
  Filled 2018-04-10 (×5): qty 1

## 2018-04-10 MED ORDER — LOSARTAN POTASSIUM 50 MG PO TABS
100.0000 mg | ORAL_TABLET | Freq: Once | ORAL | Status: AC
  Administered 2018-04-10: 100 mg via ORAL
  Filled 2018-04-10: qty 2

## 2018-04-10 MED ORDER — PROPOFOL 10 MG/ML IV BOLUS
INTRAVENOUS | Status: DC | PRN
  Administered 2018-04-10: 200 mg via INTRAVENOUS

## 2018-04-10 MED ORDER — ACETAMINOPHEN 500 MG PO TABS
1000.0000 mg | ORAL_TABLET | Freq: Four times a day (QID) | ORAL | Status: AC
  Administered 2018-04-10 – 2018-04-11 (×4): 1000 mg via ORAL
  Filled 2018-04-10 (×4): qty 2

## 2018-04-10 MED ORDER — LOSARTAN POTASSIUM-HCTZ 100-12.5 MG PO TABS: 1.0000 | ORAL_TABLET | Freq: Every day | ORAL | Status: DC

## 2018-04-10 MED ORDER — METHOCARBAMOL 500 MG PO TABS
500.0000 mg | ORAL_TABLET | Freq: Four times a day (QID) | ORAL | Status: DC | PRN
  Administered 2018-04-10 – 2018-04-12 (×4): 500 mg via ORAL
  Filled 2018-04-10 (×4): qty 1

## 2018-04-10 MED ORDER — METOCLOPRAMIDE HCL 5 MG/ML IJ SOLN: 5.0000 mg | Freq: Three times a day (TID) | INTRAMUSCULAR | Status: DC | PRN

## 2018-04-10 MED ORDER — LACTATED RINGERS IR SOLN
Status: DC | PRN
  Administered 2018-04-10: 6000 mL

## 2018-04-10 MED ORDER — HYDROCHLOROTHIAZIDE 12.5 MG PO CAPS
12.5000 mg | ORAL_CAPSULE | Freq: Every day | ORAL | Status: DC
  Administered 2018-04-11 – 2018-04-12 (×2): 12.5 mg via ORAL
  Filled 2018-04-10 (×2): qty 1

## 2018-04-10 MED ORDER — PHENYLEPHRINE 40 MCG/ML (10ML) SYRINGE FOR IV PUSH (FOR BLOOD PRESSURE SUPPORT)
PREFILLED_SYRINGE | INTRAVENOUS | Status: DC | PRN
  Administered 2018-04-10: 160 ug via INTRAVENOUS
  Administered 2018-04-10 (×11): 80 ug via INTRAVENOUS
  Administered 2018-04-10: 160 ug via INTRAVENOUS

## 2018-04-10 MED ORDER — ACETAMINOPHEN 325 MG PO TABS
325.0000 mg | ORAL_TABLET | Freq: Four times a day (QID) | ORAL | Status: DC | PRN
  Administered 2018-04-10: 650 mg via ORAL
  Filled 2018-04-10: qty 2

## 2018-04-10 MED ORDER — METHOCARBAMOL 500 MG PO TABS: 500.0000 mg | ORAL_TABLET | Freq: Four times a day (QID) | ORAL | 1 refills | Status: DC | PRN

## 2018-04-10 MED ORDER — MORPHINE SULFATE (PF) 2 MG/ML IV SOLN
2.0000 mg | INTRAVENOUS | Status: DC | PRN
  Administered 2018-04-10 – 2018-04-11 (×3): 2 mg via INTRAVENOUS
  Filled 2018-04-10 (×3): qty 1

## 2018-04-10 SURGICAL SUPPLY — 82 items
ANCHOR SUT KEITH ABD SZ2 STR (SUTURE) IMPLANT
BAG ZIPLOCK 12X15 (MISCELLANEOUS) ×3 IMPLANT
BANDAGE ACE 6X5 VEL STRL LF (GAUZE/BANDAGES/DRESSINGS) IMPLANT
BANDAGE ESMARK 6X9 LF (GAUZE/BANDAGES/DRESSINGS) IMPLANT
BIT DRILL 2.4X128 (BIT) IMPLANT
BIT DRILL 2.4X128MM (BIT)
BIT DRILL 2.8X128 (BIT) IMPLANT
BIT DRILL 2.8X128MM (BIT)
BLADE 15 SAFETY STRL DISP (BLADE) ×3 IMPLANT
BLADE 4.2CUDA (BLADE) IMPLANT
BLADE CUDA SHAVER 3.5 (BLADE) IMPLANT
BLADE CUTTER GATOR 3.5 (BLADE) ×3 IMPLANT
BNDG ELASTIC 6X10 VLCR STRL LF (GAUZE/BANDAGES/DRESSINGS) IMPLANT
BNDG ESMARK 6X9 LF (GAUZE/BANDAGES/DRESSINGS)
BOOTIES KNEE HIGH SLOAN (MISCELLANEOUS) ×6 IMPLANT
CLOSURE WOUND 1/2 X4 (GAUZE/BANDAGES/DRESSINGS) ×1
CLOTH 2% CHLOROHEXIDINE 3PK (PERSONAL CARE ITEMS) ×3 IMPLANT
COVER SURGICAL LIGHT HANDLE (MISCELLANEOUS) ×3 IMPLANT
CUFF TOURN SGL QUICK 34 (TOURNIQUET CUFF) ×2
CUFF TRNQT CYL 34X4X40X1 (TOURNIQUET CUFF) ×1 IMPLANT
DRAPE ORTHO SPLIT 77X108 STRL (DRAPES) ×4
DRAPE POUCH INSTRU U-SHP 10X18 (DRAPES) ×3 IMPLANT
DRAPE SHEET LG 3/4 BI-LAMINATE (DRAPES) IMPLANT
DRAPE SURG ORHT 6 SPLT 77X108 (DRAPES) ×2 IMPLANT
DRAPE U-SHAPE 47X51 STRL (DRAPES) IMPLANT
DRSG ADAPTIC 3X8 NADH LF (GAUZE/BANDAGES/DRESSINGS) IMPLANT
DRSG AQUACEL AG ADV 3.5X10 (GAUZE/BANDAGES/DRESSINGS) IMPLANT
DRSG EMULSION OIL 3X3 NADH (GAUZE/BANDAGES/DRESSINGS) IMPLANT
DRSG PAD ABDOMINAL 8X10 ST (GAUZE/BANDAGES/DRESSINGS) ×3 IMPLANT
DURAPREP 26ML APPLICATOR (WOUND CARE) ×3 IMPLANT
ELECT REM PT RETURN 15FT ADLT (MISCELLANEOUS) ×3 IMPLANT
GAUZE SPONGE 4X4 12PLY STRL (GAUZE/BANDAGES/DRESSINGS) ×3 IMPLANT
GLOVE BIOGEL PI IND STRL 6.5 (GLOVE) ×1 IMPLANT
GLOVE BIOGEL PI IND STRL 7.5 (GLOVE) ×1 IMPLANT
GLOVE BIOGEL PI IND STRL 8 (GLOVE) ×1 IMPLANT
GLOVE BIOGEL PI INDICATOR 6.5 (GLOVE) ×2
GLOVE BIOGEL PI INDICATOR 7.5 (GLOVE) ×2
GLOVE BIOGEL PI INDICATOR 8 (GLOVE) ×2
GLOVE SURG SS PI 7.5 STRL IVOR (GLOVE) ×6 IMPLANT
GLOVE SURG SS PI 8.0 STRL IVOR (GLOVE) ×3 IMPLANT
GLOVE SURG SS PI 8.5 STRL IVOR (GLOVE) ×2
GLOVE SURG SS PI 8.5 STRL STRW (GLOVE) ×1 IMPLANT
GOWN STRL REUS W/TWL XL LVL3 (GOWN DISPOSABLE) ×6 IMPLANT
IMMOBILIZER KNEE 20 (SOFTGOODS) ×3
IMMOBILIZER KNEE 20 THIGH 36 (SOFTGOODS) ×1 IMPLANT
KIT BASIN OR (CUSTOM PROCEDURE TRAY) ×3 IMPLANT
MANIFOLD NEPTUNE II (INSTRUMENTS) ×3 IMPLANT
NEEDLE HYPO 22GX1.5 SAFETY (NEEDLE) ×3 IMPLANT
NEEDLE MA TROC 1/2 (NEEDLE) ×3 IMPLANT
NEEDLE MA TROC 1/2 CIR (NEEDLE) IMPLANT
NEEDLE MAYO 6 CRC TAPER PT (NEEDLE) ×3 IMPLANT
NEEDLE MAYO CATGUT SZ4 (NEEDLE) IMPLANT
PACK ARTHROSCOPY WL (CUSTOM PROCEDURE TRAY) ×3 IMPLANT
PACK TOTAL JOINT (CUSTOM PROCEDURE TRAY) IMPLANT
PADDING CAST COTTON 6X4 STRL (CAST SUPPLIES) ×3 IMPLANT
PASSER SUT SWANSON 36MM LOOP (INSTRUMENTS) IMPLANT
POSITIONER SURGICAL ARM (MISCELLANEOUS) ×3 IMPLANT
STAPLER VISISTAT (STAPLE) IMPLANT
STRIP CLOSURE SKIN 1/2X4 (GAUZE/BANDAGES/DRESSINGS) ×2 IMPLANT
SUCTION FRAZIER HANDLE 12FR (TUBING) ×2
SUCTION TUBE FRAZIER 12FR DISP (TUBING) ×1 IMPLANT
SUT ETHIBOND 5 LR DA (SUTURE) IMPLANT
SUT ETHIBOND NAB CT1 #1 30IN (SUTURE) IMPLANT
SUT ETHILON 4 0 PS 2 18 (SUTURE) ×3 IMPLANT
SUT FIBERWIRE #2 38 T-5 BLUE (SUTURE) ×6
SUT PROLENE 3 0 PS 2 (SUTURE) ×3 IMPLANT
SUT VIC AB 0 CT1 27 (SUTURE)
SUT VIC AB 0 CT1 27XBRD ANTBC (SUTURE) IMPLANT
SUT VIC AB 1 CT1 27 (SUTURE) ×2
SUT VIC AB 1 CT1 27XBRD ANTBC (SUTURE) ×1 IMPLANT
SUT VIC AB 2-0 CT1 27 (SUTURE) ×2
SUT VIC AB 2-0 CT1 TAPERPNT 27 (SUTURE) ×1 IMPLANT
SUTURE FIBERWR #2 38 T-5 BLUE (SUTURE) ×2 IMPLANT
SYR 20CC LL (SYRINGE) ×3 IMPLANT
SYS INTERNAL BRACE KNEE (Miscellaneous) ×3 IMPLANT
SYSTEM INTERNAL BRACE KNEE (Miscellaneous) ×1 IMPLANT
TOWEL OR 17X26 10 PK STRL BLUE (TOWEL DISPOSABLE) ×3 IMPLANT
TOWEL OR NON WOVEN STRL DISP B (DISPOSABLE) ×3 IMPLANT
TUBING ARTHRO INFLOW-ONLY STRL (TUBING) ×3 IMPLANT
WAND HAND CNTRL MULTIVAC 50 (MISCELLANEOUS) ×3 IMPLANT
WAND HAND CNTRL MULTIVAC 90 (MISCELLANEOUS) IMPLANT
WRAP KNEE MAXI GEL POST OP (GAUZE/BANDAGES/DRESSINGS) ×3 IMPLANT

## 2018-04-10 NOTE — Progress Notes (Signed)
Patient transferred from 3rd floor in stable condition. No change from initial pm assessment. Will continue to monitor and follow the plan of care.

## 2018-04-10 NOTE — Progress Notes (Signed)
Dr Valma Cava at bedside. Instructed to give hydralazine 10 mg

## 2018-04-10 NOTE — Interval H&P Note (Signed)
History and Physical Interval Note:  04/10/2018 10:23 AM  Zachary Powers  has presented today for surgery, with the diagnosis of Quad tendon tear, meniscus tear, patella chondromalacia left knee  The various methods of treatment have been discussed with the patient and family. After consideration of risks, benefits and other options for treatment, the patient has consented to  Procedure(s) with comments: Left knee arthroscopy, debridement, chondroplasty patella, possible menisectomy (Left) - 2 hrs for both procedures Mini open quad tendon repair (Left) as a surgical intervention .  The patient's history has been reviewed, patient examined, no change in status, stable for surgery.  I have reviewed the patient's chart and labs.  Questions were answered to the patient's satisfaction.     [name]

## 2018-04-10 NOTE — Anesthesia Postprocedure Evaluation (Signed)
Anesthesia Post Note  Patient: Zachary Powers  Procedure(s) Performed: Left knee arthroscopy, debridement, chondroplasty patella, possible menisectomy (Left Knee) Mini open quad tendon repair (Left Knee)     Patient location during evaluation: PACU Anesthesia Type: General Level of consciousness: awake Pain management: pain level controlled Vital Signs Assessment: post-procedure vital signs reviewed and stable Respiratory status: spontaneous breathing Cardiovascular status: stable Postop Assessment: adequate PO intake Anesthetic complications: no    Last Vitals:  Vitals:   04/10/18 0848  BP: 115/80  Pulse: 89  Resp: 18  Temp: 36.7 C  SpO2: 100%    Last Pain:  Vitals:   04/10/18 0848  TempSrc: Oral                 Yosiel Thieme

## 2018-04-10 NOTE — Progress Notes (Signed)
Paged Wyatt Portela PA with ECG results

## 2018-04-10 NOTE — Brief Op Note (Signed)
04/10/2018  12:42 PM  PATIENT:  Zachary Powers  52 y.o. male  PRE-OPERATIVE DIAGNOSIS:  Quad tendon tear, meniscus tear, patella chondromalacia left knee  POST-OPERATIVE DIAGNOSIS:  Quad tendon tear, meniscus tear, patella chondromalacia left knee  PROCEDURE:  Procedure(s) with comments: Left knee arthroscopy, debridement, chondroplasty patella, possible menisectomy (Left) - 2 hrs for both procedures Mini open quad tendon repair (Left)  SURGEON:  Surgeon(s) and Role:    Susa Day, MD - Primary  PHYSICIAN ASSISTANT:   ASSISTANTS: [place]   ANESTHESIA:   general  EBL:  25 mL   BLOOD ADMINISTERED:none  DRAINS: none   LOCAL MEDICATIONS USED:  MARCAINE     SPECIMEN:  No Specimen  DISPOSITION OF SPECIMEN:  N/A  COUNTS:  YES  TOURNIQUET:  * Missing tourniquet times found for documented tourniquets in log: [id number] *  DICTATION: .Other Dictation: Dictation Number [id number]  PLAN OF CARE: Admit for overnight observation  PATIENT DISPOSITION:  PACU - hemodynamically stable.   Delay start of Pharmacological VTE agent (>24hrs) due to surgical blood loss or risk of bleeding: no

## 2018-04-10 NOTE — Anesthesia Procedure Notes (Signed)
Procedure Name: LMA Insertion Date/Time: 04/10/2018 10:59 AM Performed by: Mitzie Na, CRNA Pre-anesthesia Checklist: Emergency Drugs available, Patient identified, Suction available, Patient being monitored and Timeout performed Patient Re-evaluated:Patient Re-evaluated prior to induction Oxygen Delivery Method: Circle system utilized Preoxygenation: Pre-oxygenation with 100% oxygen Induction Type: IV induction Ventilation: Mask ventilation without difficulty LMA: LMA inserted LMA Size: 4.0 Number of attempts: 1 Dental Injury: Teeth and Oropharynx as per pre-operative assessment

## 2018-04-10 NOTE — Progress Notes (Signed)
Patient transferred to 1431.

## 2018-04-10 NOTE — Op Note (Signed)
NAME: Zachary Powers, Zachary Powers MEDICAL RECORD [id number] ACCOUNT [id number] DATE OF BIRTH:Jun 16, 1966 FACILITY: [facility] LOCATION: [id number] PHYSICIAN:Lawton Dollinger Windy Kalata, MD  OPERATIVE REPORT  DATE OF PROCEDURE:  04/10/2018  PREOPERATIVE DIAGNOSES: 1.  Quadriceps tendon tear, left knee. 2.  Medial meniscus tear, left knee. 3.  Posttraumatic chondromalacia, left knee.  POSTOPERATIVE DIAGNOSES:   1.  Quadriceps tendon tear, left knee. 2.  Medial meniscus tear, left knee. 3.  Posttraumatic chondromalacia, left knee.  PROCEDURE PERFORMED: 1.  Left knee arthroscopy. 2.  Chondroplasty of the patellofemoral sulcus. 3.  Partial medial meniscectomy. 4.  Mini-open quadriceps tendon repair utilizing PushLock suture anchors.  ANESTHESIA:  General.  ASSISTANT:  Lacie Draft, PA   HISTORY:  This is a 52 year old male who fell at work, quad tendon, also patellofemoral chondromalacia.  Felt to be posttraumatic and possible medial meniscus tear.  He had mainly VMO tear extending into the central portion of the tendon.  The lateral  portion was intact.  He was indicated for repair.  Risks and benefits discussed including bleeding, infection, damage to neurovascular structures, no change in symptoms, worsening symptoms, DVT, PE, anesthetic complications, etc.  TECHNIQUE:  With the patient in supine position after induction of adequate anesthesia and 2 g of Kefzol, left lower extremity was prepped and draped in the usual sterile fashion.  Lateral parapatellar portal was fashioned with #11 blade and gross  cannula atraumatically placed.  Irrigant was utilized to insufflate the joint.  Under direct visualization, a medial parapatellar portal was fashioned with a #11 blade after localization with an 18-gauge needle, sparing the medial meniscus.  A small  radial tear in the medial meniscus was shaved with a 3.5 shaver.  Some mild chondromalacia of the medial femoral condyle like a grade III, 2 x 2, light chondroplasty  was performed here.  ACL was unremarkable.  Lateral compartment essentially unremarkable  with grade III changes of the patella and the sulcus.  Light chondroplasty was performed here.  The tear was noted on the medial aspect of the patellar tendon.  The lateral and central portion of the quadriceps tendon was fairly intact.  I localized  that externally with an 18-gauge needle, marking it on the skin.  The remainder of the joint was unremarkable.  I therefore removed all instrumentation and closed the portals with 4-0 nylon simple sutures.  I made a 5 cm incision in the superior pole of  the patella just medial to the midline.  Subcutaneous tissue was dissected with electrocautery and achieved hemostasis.  I incised the retinaculum.  I identified the VMO extension of that and into the center part of the tendon.  I performed a trough in  the superior part of the patellar tendon with a bare rongeur.  I then used a TigerTape and FiberTape with a Krakow stitch up to and through the quadriceps tendon with 2 separate leaflets.  I then drilled 2 pre-holes 1.5 cm apart in the patella in that  bed.  Two 20 mm of depth.  These were both tapped.  Then I used a SwiveLock and put the suture ends from the FiberTape into the SwiveLock, advanced the quadriceps tendon with the knee in extension, assisting that with Allis retractors and then pulled it  into the SwiveLock.  Following that, I used the rescue suture threaded backwards, up, back and through the tendon for reinforcement of that attachment and repair.  This was threaded to the tendon and tied in horizontal mattress-type suture.  Then the  residual  lens from the quad tendon and capture were then reflected and placed through the quadriceps tendon as well and sutured to that the patella through the tendon.  We closed the entire defect.  We debrided a portion of the tendon prior to that.  I  then flexed the knee to 45 degrees with good continuity, good patellofemoral  tracking.  The tendon was palpated and was felt to be a full repair.  There was a defect prior to this.  Next, I copiously irrigated the wound.  Repaired the subcutaneous with 2-0 and skin with staples.  The wound was dressed sterilely, placed in immobilizer, extubated without difficulty and transported to the recovery room in satisfactory condition.  The patient tolerated the procedure well.  No complications.  Assistant Lacie Draft, [place]. Minimal blood loss.  No tourniquet.  LN/NUANCE  D:04/10/2018 T:04/10/2018 JOB:[id number]

## 2018-04-10 NOTE — Anesthesia Preprocedure Evaluation (Signed)
Anesthesia Evaluation  Patient identified by MRN, date of birth, ID band Patient awake    Reviewed: Allergy & Precautions, NPO status , Patient's Chart, lab work & pertinent test results  Airway Mallampati: II  TM Distance: >3 FB     Dental   Pulmonary asthma , Current Smoker,    breath sounds clear to auscultation       Cardiovascular hypertension,  Rhythm:Regular Rate:Normal     Neuro/Psych    GI/Hepatic Neg liver ROS, GERD  ,  Endo/Other  negative endocrine ROS  Renal/GU negative Renal ROS     Musculoskeletal   Abdominal   Peds  Hematology   Anesthesia Other Findings   Reproductive/Obstetrics                             Anesthesia Physical Anesthesia Plan  ASA: III  Anesthesia Plan: General   Post-op Pain Management:    Induction: Intravenous  PONV Risk Score and Plan: Ondansetron, Dexamethasone and Midazolam  Airway Management Planned: LMA  Additional Equipment:   Intra-op Plan:   Post-operative Plan: Extubation in OR  Informed Consent: I have reviewed the patients History and Physical, chart, labs and discussed the procedure including the risks, benefits and alternatives for the proposed anesthesia with the patient or authorized representative who has indicated his/her understanding and acceptance.   Dental advisory given  Plan Discussed with: CRNA and Anesthesiologist  Anesthesia Plan Comments:         Anesthesia Quick Evaluation

## 2018-04-10 NOTE — Progress Notes (Signed)
Patient's hear rate in the low 120's. Order to transfer to room 1431. Patient informed, advised to call his family if he wishes.

## 2018-04-10 NOTE — Transfer of Care (Signed)
Immediate Anesthesia Transfer of Care Note  Patient: Zachary Powers  Procedure(s) Performed: Left knee arthroscopy, debridement, chondroplasty patella, possible menisectomy (Left Knee) Mini open quad tendon repair (Left Knee)  Patient Location: PACU  Anesthesia Type:General  Level of Consciousness: awake, oriented, drowsy and patient cooperative  Airway & Oxygen Therapy: Patient Spontanous Breathing and Patient connected to face mask oxygen  Post-op Assessment: Report given to RN, Post -op Vital signs reviewed and stable and Patient moving all extremities  Post vital signs: Reviewed and stable  Last Vitals:  Vitals Value Taken Time  BP 156/90 04/10/2018  1:10 PM  Temp    Pulse 78 04/10/2018  1:14 PM  Resp 19 04/10/2018  1:14 PM  SpO2 100 % 04/10/2018  1:14 PM  Vitals shown include unvalidated device data.  Last Pain:  Vitals:   04/10/18 0848  TempSrc: Oral         Complications: No apparent anesthesia complications

## 2018-04-11 ENCOUNTER — Encounter (HOSPITAL_COMMUNITY): Payer: Self-pay | Admitting: Specialist

## 2018-04-11 DIAGNOSIS — S76112A Strain of left quadriceps muscle, fascia and tendon, initial encounter: Secondary | ICD-10-CM | POA: Diagnosis not present

## 2018-04-11 DIAGNOSIS — S86819A Strain of other muscle(s) and tendon(s) at lower leg level, unspecified leg, initial encounter: Secondary | ICD-10-CM

## 2018-04-11 LAB — CBC WITH DIFFERENTIAL/PLATELET
BASOS PCT: 0 %
Basophils Absolute: 0 10*3/uL (ref 0.0–0.1)
EOS PCT: 0 %
Eosinophils Absolute: 0 10*3/uL (ref 0.0–0.7)
HCT: 50.2 % (ref 39.0–52.0)
HEMOGLOBIN: 17 g/dL (ref 13.0–17.0)
LYMPHS PCT: 16 %
Lymphs Abs: 2.8 10*3/uL (ref 0.7–4.0)
MCH: 28.1 pg (ref 26.0–34.0)
MCHC: 33.9 g/dL (ref 30.0–36.0)
MCV: 82.8 fL (ref 78.0–100.0)
Monocytes Absolute: 3 10*3/uL — ABNORMAL HIGH (ref 0.1–1.0)
Monocytes Relative: 17 %
Neutro Abs: 11.8 10*3/uL — ABNORMAL HIGH (ref 1.7–7.7)
Neutrophils Relative %: 67 %
Platelets: 225 10*3/uL (ref 150–400)
RBC: 6.06 MIL/uL — AB (ref 4.22–5.81)
RDW: 13.8 % (ref 11.5–15.5)
WBC: 17.6 10*3/uL — AB (ref 4.0–10.5)

## 2018-04-11 LAB — COMPREHENSIVE METABOLIC PANEL
ALBUMIN: 4.4 g/dL (ref 3.5–5.0)
ALT: 18 U/L (ref 0–44)
ANION GAP: 12 (ref 5–15)
AST: 17 U/L (ref 15–41)
Alkaline Phosphatase: 53 U/L (ref 38–126)
BUN: 13 mg/dL (ref 6–20)
CHLORIDE: 101 mmol/L (ref 98–111)
CO2: 27 mmol/L (ref 22–32)
Calcium: 9.8 mg/dL (ref 8.9–10.3)
Creatinine, Ser: 0.96 mg/dL (ref 0.61–1.24)
GFR calc Af Amer: >60 mL/min (ref >60)
GFR calc non Af Amer: >60 mL/min (ref >60)
GLUCOSE: 115 mg/dL — AB (ref 70–99)
POTASSIUM: 4 mmol/L (ref 3.5–5.1)
Sodium: 140 mmol/L (ref 135–145)
Total Bilirubin: 0.5 mg/dL (ref 0.3–1.2)
Total Protein: 7.8 g/dL (ref 6.5–8.1)

## 2018-04-11 MED ORDER — METOPROLOL SUCCINATE ER 25 MG PO TB24
12.5000 mg | ORAL_TABLET | Freq: Every day | ORAL | Status: DC
  Administered 2018-04-11 – 2018-04-12 (×2): 12.5 mg via ORAL
  Filled 2018-04-11 (×2): qty 1

## 2018-04-11 MED ORDER — NICOTINE 21 MG/24HR TD PT24
21.0000 mg | MEDICATED_PATCH | Freq: Every day | TRANSDERMAL | Status: DC
  Administered 2018-04-11 – 2018-04-12 (×2): 21 mg via TRANSDERMAL
  Filled 2018-04-11 (×2): qty 1

## 2018-04-11 MED ORDER — ASPIRIN 325 MG PO TBEC: 325.0000 mg | DELAYED_RELEASE_TABLET | Freq: Every day | ORAL | 0 refills | Status: DC

## 2018-04-11 MED ORDER — OXYCODONE HCL 5 MG PO TABS
15.0000 mg | ORAL_TABLET | ORAL | Status: DC | PRN
  Administered 2018-04-11 – 2018-04-12 (×5): 15 mg via ORAL
  Filled 2018-04-11 (×5): qty 3

## 2018-04-11 MED ORDER — METOPROLOL SUCCINATE ER 25 MG PO TB24: 12.5000 mg | ORAL_TABLET | Freq: Every day | ORAL | 1 refills | Status: DC

## 2018-04-11 NOTE — Progress Notes (Signed)
Physical Therapy Treatment Patient Details Name: Zachary Powers MRN: [id number] DOB: 1966/05/09 Today's Date: 04/11/2018    History of Present Illness Pt is a 52 year old male s/p Left knee arthroscopy, debridement, chondroplasty patella, partial medial menisectomy and left mini open quad tendon repair     PT Comments    Pt ambulated again in hallway and practiced safe stair technique.  Pt to possibly d/c home later today.  Pt feels ready for d/c and had no further questions.  Emphasized maintaining TROM brace at all times until follow up and PWB status and pt reports understanding.   Follow Up Recommendations  No PT follow up     Equipment Recommendations  Rolling [place] with 5" wheels    Recommendations for Other Services       Precautions / Restrictions Precautions Precautions: Fall Required Braces or Orthoses: Other Brace/Splint Other Brace/Splint: TROM locked in extension Restrictions Weight Bearing Restrictions: Yes LLE Weight Bearing: Partial weight bearing    Mobility  Bed Mobility               General bed mobility comments: pt up in recliner on arrival   Transfers Overall transfer level: Needs assistance Equipment used: Rolling [place] (2 wheeled) Transfers: Sit to/from Stand Sit to Stand: Supervision         General transfer comment: verbal cues for positioning  Ambulation/Gait Ambulation/Gait assistance: Min guard;Supervision Gait Distance (Feet): 160 Feet(x2) Assistive device: Rolling Rohlfs (2 wheeled) Gait Pattern/deviations: Step-through pattern;Antalgic     General Gait Details: cues for PWB and using RW, pt tolerated well, able to progress distance   Stairs Stairs: Yes Stairs assistance: Min guard Stair Management: Two rails;Step to pattern;Forwards Number of Stairs: 2 General stair comments: verbal cues for safe technique and sequence, pt performed well, reports understanding   Wheelchair Mobility    Modified Rankin (Stroke  Patients Only)       Balance                                            Cognition Arousal/Alertness: Awake/alert Behavior During Therapy: WFL for tasks assessed/performed Overall Cognitive Status: Within Functional Limits for tasks assessed                                        Exercises      General Comments        Pertinent Vitals/Pain Pain Assessment: 0-10 Pain Score: 5  Pain Location: L knee Pain Descriptors / Indicators: Sore;Aching Pain Intervention(s): Limited activity within patient's tolerance;Repositioned;Monitored during session    Home Living                      Prior Function            PT Goals (current goals can now be found in the care plan section) Progress towards PT goals: Progressing toward goals    Frequency    7X/week      PT Plan Current plan remains appropriate    Co-evaluation              AM-PAC PT "6 Clicks" Daily Activity  Outcome Measure  Difficulty turning over in bed (including adjusting bedclothes, sheets and blankets)?: None Difficulty moving from lying on back to sitting on the  side of the bed? : A Lot Difficulty sitting down on and standing up from a chair with arms (e.g., wheelchair, bedside commode, etc,.)?: A Lot Help needed moving to and from a bed to chair (including a wheelchair)?: A Little Help needed walking in hospital room?: A Little Help needed climbing 3-5 steps with a railing? : A Little 6 Click Score: 17    End of Session Equipment Utilized During Treatment: Gait belt Activity Tolerance: Patient tolerated treatment well Patient left: in chair;with call bell/phone within reach Nurse Communication: Mobility status PT Visit Diagnosis: Difficulty in walking, not elsewhere classified (R26.2)     Time: 1031-2811 PT Time Calculation (min) (ACUTE ONLY): 16 min  Charges:  $Gait Training: 8-22 mins                     Carmelia Bake, PT, [place] Office: [phone] Pager: [phone]  Trena Platt 04/11/2018, 4:07 PM

## 2018-04-11 NOTE — Consult Note (Addendum)
NO CHARGE NOTE_curbsided  Advice  Patient should continue his combination lisinopril HCTZ-  get ambulatory pressures in the next week once his postop pain resolve  recommend metoprolol XL 12.5 daily  Please discharge patient with a nicotine patch-I have counseled him regarding smoking and the deleterious effects of this on healing    Dr Tonita Cong req consult on  This 52 year old male asthma, smoker, bipolar, back pain who had tear medial meniscal tear and posttraumatic chondromalacia status post arthroscopy left knee, partial medial meniscectomy and mini open quadriceps tendon repair on 9/11 He had an injury in his left knee with 7/10 pain that debilitated him from work  Reason for Consult was hypertension  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-smokes 1 pack/day Events overnight 9/11 patient was severely hypertensive to the 180 range and this seems to have resolved a little bit but because of some mild tachycardia and persistent hypertension medicine consult was requested Labs were ordered to rule out hypovolemia or volume depletion and the only finding was a wbc of 17 which could be postop  He states his pain is 9/10 but had just work with therapy when I saw him  Alert pleasant oriented no distress Chest clinically clear S1-S2 no murmur rub or gallop-telemetry reviewed some sinus tach to the 120 range but currently in sinus rhythm at 80s Abdomen is soft nontender no rebound

## 2018-04-11 NOTE — Evaluation (Signed)
Physical Therapy Evaluation Patient Details Name: Zachary Powers MRN: [id number] DOB: [birth year] Today's Date: 04/11/2018   History of Present Illness  Pt is a 51 year old male s/p Left knee arthroscopy, debridement, chondroplasty patella, partial medial menisectomy and left mini open quad tendon repair   Clinical Impression  Patient is s/p above surgery resulting in functional limitations due to the deficits listed below (see PT Problem List).  Patient will benefit from skilled PT to increase their independence and safety with mobility to allow discharge to the venue listed below.  Pt assisted with applying TROM brace careful to maintain knee extension during process.  Pt ambulated in hallway with RW and feels more secure with RW then crutches, prefers RW for home.  Pt has a few steps to enter home and would like to practice these prior to d/c so will return to practice steps this afternoon.  Pt hopeful for d/c home later today.     Follow Up Recommendations No PT follow up    Equipment Recommendations  Rolling [place] with 5" wheels    Recommendations for Other Services       Precautions / Restrictions Precautions Precautions: Fall Required Braces or Orthoses: Other Brace/Splint Other Brace/Splint: TROM locked in extension Restrictions Weight Bearing Restrictions: Yes LLE Weight Bearing: Partial weight bearing      Mobility  Bed Mobility               General bed mobility comments: pt up in recliner on arrival wearing KI; pt assisted with applying TROM brace carefully keeping knee in extension  Transfers Overall transfer level: Needs assistance Equipment used: Rolling [place] (2 wheeled) Transfers: Sit to/from Stand Sit to Stand: Min guard         General transfer comment: verbal cues for positioning  Ambulation/Gait Ambulation/Gait assistance: Min guard Gait Distance (Feet): 120 Feet Assistive device: Rolling Plucinski (2 wheeled) Gait Pattern/deviations:  Step-through pattern;Antalgic     General Gait Details: cues for PWB and using RW, pt tolerated well, feels more steady with RW then crutches (used prior to surgery)  Stairs            Wheelchair Mobility    Modified Rankin (Stroke Patients Only)       Balance                                             Pertinent Vitals/Pain Pain Assessment: 0-10 Pain Score: 7  Pain Location: L knee Pain Descriptors / Indicators: Sore;Aching Pain Intervention(s): Limited activity within patient's tolerance;Repositioned;Monitored during session;Ice applied    Home Living Family/patient expects to be discharged to:: Private residence Living Arrangements: Children     Home Access: Stairs to enter   Technical brewer of Steps: 3 Home Layout: One level Home Equipment: Crutches      Prior Function                 Hand Dominance        Extremity/Trunk Assessment        Lower Extremity Assessment Lower Extremity Assessment: LLE deficits/detail LLE Deficits / Details: pt wearing KI on arrival so assisted with applying TROM brace (pt brought from home) as per orders "TROM locked in extension" and maintained knee extension throughout LLE: Unable to fully assess due to immobilization       Communication   Communication: No difficulties  Cognition  Arousal/Alertness: Awake/alert Behavior During Therapy: WFL for tasks assessed/performed Overall Cognitive Status: Within Functional Limits for tasks assessed                                        General Comments      Exercises     Assessment/Plan    PT Assessment Patient needs continued PT services  PT Problem List Decreased strength;Decreased mobility;Decreased knowledge of use of DME;Pain;Decreased knowledge of precautions       PT Treatment Interventions Gait training;Stair training;Therapeutic exercise;DME instruction;Therapeutic activities;Patient/family  education;Functional mobility training    PT Goals (Current goals can be found in the Care Plan section)  Acute Rehab PT Goals PT Goal Formulation: With patient Time For Goal Achievement: 04/18/18 Potential to Achieve Goals: Good    Frequency 7X/week   Barriers to discharge        Co-evaluation               AM-PAC PT "6 Clicks" Daily Activity  Outcome Measure Difficulty turning over in bed (including adjusting bedclothes, sheets and blankets)?: None Difficulty moving from lying on back to sitting on the side of the bed? : A Lot Difficulty sitting down on and standing up from a chair with arms (e.g., wheelchair, bedside commode, etc,.)?: A Lot Help needed moving to and from a bed to chair (including a wheelchair)?: A Little Help needed walking in hospital room?: A Little Help needed climbing 3-5 steps with a railing? : A Lot 6 Click Score: 16    End of Session Equipment Utilized During Treatment: Gait belt Activity Tolerance: Patient tolerated treatment well Patient left: in chair;with call bell/phone within reach Nurse Communication: Mobility status PT Visit Diagnosis: Difficulty in walking, not elsewhere classified (R26.2)    Time: 1100-1118 PT Time Calculation (min) (ACUTE ONLY): 18 min   Charges:   PT Evaluation $PT Eval Low Complexity: [place], PT, DPT Acute Rehabilitation Services Office: [phone] Pager: [phone]  Trena Platt 04/11/2018, 11:58 AM

## 2018-04-11 NOTE — Progress Notes (Signed)
CSW consulted for [facility] placement. Per chart review, PT recommended No PT follow up. CSW signing off, no other needs identified at this time.  Abundio Miu, [place] Social Worker [facility] Cell#: [phone]

## 2018-04-11 NOTE — Progress Notes (Addendum)
Subjective: 1 Day Post-Op Procedure(s) (LRB): Left knee arthroscopy, debridement, chondroplasty patella, possible menisectomy (Left) Mini open quad tendon repair (Left) Patient reports pain as moderate.   Events overnight noted, transferred to tele due to sinus tachy HTN today Seen by Dr. Tonita Cong in AM rounds.  Objective: Vital signs in last 24 hours: Temp:  [98 F (36.7 C)-98.4 F (36.9 C)] 98.3 F (36.8 C) (09/12 0624) Pulse Rate:  [71-119] 90 (09/12 1011) Resp:  [12-21] 18 (09/12 0624) BP: (152-210)/(88-131) 174/90 (09/12 1011) SpO2:  [96 %-100 %] 96 % (09/12 0624)  Intake/Output from previous day: 09/11 0701 - 09/12 0700 In: 3060.5 [P.O.:240; I.V.:2635; IV Piggyback:185.5] Out: 1216 [Urine:3925; Blood:50] Intake/Output this shift: Total I/O In: -  Out: 200 [Urine:200]  No results for input(s): HGB in the last 72 hours. No results for input(s): WBC, RBC, HCT, PLT in the last 72 hours. No results for input(s): NA, K, CL, CO2, BUN, CREATININE, GLUCOSE, CALCIUM in the last 72 hours. No results for input(s): LABPT, INR in the last 72 hours.  Neurologically intact ABD soft Neurovascular intact Sensation intact distally Intact pulses distally Dorsiflexion/Plantar flexion intact Incision: dressing C/D/I and no drainage No cellulitis present Compartment soft no calf pain or sign of DVT  Assessment/Plan: 1 Day Post-Op Procedure(s) (LRB): Left knee arthroscopy, debridement, chondroplasty patella, possible menisectomy (Left) Mini open quad tendon repair (Left) Advance diet Up with therapy D/C IV fluids Will remain in TROM brace and ACE/dressing underneath until follow up Dr. Tonita Cong to contact hospitalists today for assistance with medical issues Possible D/c later today if medically stable   BISSELL, JACLYN M. 04/11/2018, 10:33 AM   Events of today noted. Run of [place]. Medicine made aware. Metroprolol added. Will add metoprolol for D/C. Discussed with Dr. Tonita Cong I  spoke with patient's work comp nurse case manager Virl Axe. Informed her patient would need a [place] instead of crutches. Her phone # is [phone]. Hopeful for D/C tomorrow as long as medically stable. Appreciate hospitalist's assistance

## 2018-04-11 NOTE — Progress Notes (Signed)
Patient had 8 beats VTACH, see flowsheet VS. BP remains elevated. Pt is asymptomatic otherwise. Pt denies pain, SOB, dizziness and or weakness. Hospitalist Provider updated. Also spoke to PA whom reports from Orthopedics stand point pt is ok for DC.

## 2018-04-12 DIAGNOSIS — S76112A Strain of left quadriceps muscle, fascia and tendon, initial encounter: Secondary | ICD-10-CM | POA: Diagnosis not present

## 2018-04-12 LAB — MAGNESIUM: MAGNESIUM: 2.1 mg/dL (ref 1.7–2.4)

## 2018-04-12 MED ORDER — METOPROLOL SUCCINATE ER 25 MG PO TB24: 25.0000 mg | ORAL_TABLET | Freq: Every day | ORAL | 0 refills | Status: DC

## 2018-04-12 MED ORDER — METOPROLOL SUCCINATE ER 25 MG PO TB24: 25.0000 mg | ORAL_TABLET | Freq: Every day | ORAL | Status: DC

## 2018-04-12 MED ORDER — METOPROLOL SUCCINATE ER 25 MG PO TB24
12.5000 mg | ORAL_TABLET | Freq: Once | ORAL | Status: AC
  Administered 2018-04-12: 12.5 mg via ORAL
  Filled 2018-04-12: qty 1

## 2018-04-12 NOTE — Progress Notes (Signed)
Physical Therapy Treatment Patient Details Name: Zachary Powers MRN: [id number] DOB: 1966-06-02 Today's Date: 04/12/2018    History of Present Illness Pt is a 52 year old male s/p Left knee arthroscopy, debridement, chondroplasty patella, partial medial menisectomy and left mini open quad tendon repair     PT Comments    Pt given BP meds per RN and likely to d/c home today. Pt in bed on arrival so assisted pt with ambulation and then he remained upright in recliner.  Pt eager for d/c today and had no further questions.  Follow Up Recommendations  No PT follow up     Equipment Recommendations  Rolling [place]ch with 5" wheels    Recommendations for Other Services       Precautions / Restrictions Precautions Precautions: Fall Required Braces or Orthoses: Other Brace/Splint Other Brace/Splint: TROM locked in extension Restrictions LLE Weight Bearing: Partial weight bearing    Mobility  Bed Mobility Overal bed mobility: Needs Assistance Bed Mobility: Supine to Sit     Supine to sit: Min assist     General bed mobility comments: pt requested assist for L LE  Transfers Overall transfer level: Needs assistance Equipment used: Rolling [place] (2 wheeled) Transfers: Sit to/from Stand Sit to Stand: Supervision         General transfer comment: verbal cues for positioning  Ambulation/Gait Ambulation/Gait assistance: Supervision Gait Distance (Feet): 240 Feet Assistive device: Rolling Staszewski (2 wheeled) Gait Pattern/deviations: Step-through pattern;Antalgic     General Gait Details: cues for PWB, more heel weight bearing (per PA notes today) and using RW, pt tolerated well, able to progress distance; denies pain but reports more like "pressure"   Stairs             Wheelchair Mobility    Modified Rankin (Stroke Patients Only)       Balance                                            Cognition Arousal/Alertness: Awake/alert Behavior  During Therapy: WFL for tasks assessed/performed Overall Cognitive Status: Within Functional Limits for tasks assessed                                        Exercises      General Comments        Pertinent Vitals/Pain Pain Assessment: 0-10 Pain Score: 2  Pain Location: L knee Pain Descriptors / Indicators: Pressure Pain Intervention(s): Limited activity within patient's tolerance;Repositioned;Monitored during session    Home Living                      Prior Function            PT Goals (current goals can now be found in the care plan section) Progress towards PT goals: Progressing toward goals    Frequency    7X/week      PT Plan Current plan remains appropriate    Co-evaluation              AM-PAC PT "6 Clicks" Daily Activity  Outcome Measure  Difficulty turning over in bed (including adjusting bedclothes, sheets and blankets)?: None Difficulty moving from lying on back to sitting on the side of the bed? : Unable Difficulty sitting down on and  standing up from a chair with arms (e.g., wheelchair, bedside commode, etc,.)?: A Lot Help needed moving to and from a bed to chair (including a wheelchair)?: A Little Help needed walking in hospital room?: A Little Help needed climbing 3-5 steps with a railing? : A Little 6 Click Score: 16    End of Session   Activity Tolerance: Patient tolerated treatment well Patient left: in chair;with call bell/phone within reach Nurse Communication: Mobility status PT Visit Diagnosis: Difficulty in walking, not elsewhere classified (R26.2)     Time: 8270-7867 PT Time Calculation (min) (ACUTE ONLY): 15 min  Charges:  $Gait Training: 8-22 mins                     Carmelia Bake, PT, [place] Office: [phone] Pager: [phone]  Trena Platt 04/12/2018, 12:23 PM

## 2018-04-12 NOTE — Progress Notes (Signed)
Mag level normal Discussed with Dr. Tonita Cong Pt received 12.5 Toprol XL earlier today. Will give another dose of 12.5mg  and will start 25mg  daily tomorrow. Rx corrected for D/C to 25mg  daily. Needs to follow with PCP next week regarding HTN and med mgmt. Will D/C

## 2018-04-12 NOTE — Discharge Instructions (Signed)
Remain in TROM brace at all times with knee in extension Partial weightbearing left leg - keep weight on heel to keep leg in extension Follow up 10-14 days post-op for suture removal Take Aspirin 325mg  once daily to prevent clots Ice and elevate L leg toes above the nose 20-30 min at a time 3-4x/day to reduce swelling Follow up with PCP the week of 9/16 for evaluation of blood pressure

## 2018-04-12 NOTE — Discharge Summary (Signed)
Patient ID: Zachary Powers MRN: [id number] DOB/AGE: 10-23-1965 52 y.o.  Admit date: 04/10/2018 Discharge date: 04/12/2018  Admission Diagnoses:  Active Problems:   Tendon rupture, traumatic, patella   Discharge Diagnoses:  Same  Past Medical History:  Diagnosis Date  . Anxiety   . Asthma   . Back pain   . Depression   . GERD (gastroesophageal reflux disease)   . Hypertension     Surgeries: Procedure(s): Left knee arthroscopy, debridement, chondroplasty patella, possible menisectomy Mini open quad tendon repair on 04/10/2018   Consultants:   Discharged Condition: Improved  Hospital Course: BRIAN ZEITLIN is an 52 y.o. male who was admitted 04/10/2018 for operative treatment of<principal problem not specified>. Patient has severe unremitting pain that affects sleep, daily activities, and work/hobbies. After pre-op clearance the patient was taken to the operating room on 04/10/2018 and underwent  Procedure(s): Left knee arthroscopy, debridement, chondroplasty patella, possible menisectomy Mini open quad tendon repair.    Patient was given perioperative antibiotics:  Anti-infectives (From admission, onward)   Start     Dose/Rate Route Frequency Ordered Stop   04/10/18 1700  ceFAZolin (ANCEF) IVPB 2g/100 mL premix     2 g 200 mL/hr over 30 Minutes Intravenous Every 6 hours 04/10/18 1452 04/10/18 1719   04/10/18 0845  ceFAZolin (ANCEF) IVPB 2g/100 mL premix     2 g 200 mL/hr over 30 Minutes Intravenous On call to O.R. 04/10/18 0835 04/10/18 1101       Patient was given sequential compression devices, early ambulation, and chemoprophylaxis to prevent DVT. Overnight the night of surgery, he was noted to be in sinus tachycardia and was hypertensive as well. He was transferred to tele for monitoring. He was seen in consultation day 1 post-op by hospitalist and started on toprol XL for HTN 12.5mg . He did have 2 small runs of VTach post-op day 1. Discussed with hospitalist again, mag  level normal, metoprolol dose increased. Per Dr. Verlon Au, the hospitalist, pt safe for D/C. Patient benefited maximally from hospital stay and there were no complications.    Recent vital signs:  Patient Vitals for the past 24 hrs:  BP Temp Temp src Pulse Resp SpO2  04/12/18 0915 (!) 166/98 - - 90 16 99 %  04/12/18 0516 (!) 171/100 97.9 F (36.6 C) Oral 89 18 97 %  04/11/18 2202 (!) 168/98 98.5 F (36.9 C) Oral 84 18 96 %  04/11/18 1309 (!) 188/97 98.4 F (36.9 C) Oral 88 18 99 %     Recent laboratory studies:  Recent Labs    04/11/18 1146  WBC 17.6*  HGB 17.0  HCT 50.2  PLT 225  NA 140  K 4.0  CL 101  CO2 27  BUN 13  CREATININE 0.96  GLUCOSE 115*  CALCIUM 9.8     Discharge Medications:   Allergies as of 04/12/2018      Reactions   Barbiturates    Recovering addict      Medication List    TAKE these medications   aspirin 325 MG EC tablet Take 1 tablet (325 mg total) by mouth daily with breakfast.   docusate sodium 100 MG capsule Commonly known as:  COLACE Take 1 capsule (100 mg total) by mouth 2 (two) times daily as needed.   LORazepam 0.5 MG tablet Commonly known as:  ATIVAN Take 0.5 mg by mouth 3 (three) times daily as needed for anxiety.   losartan-hydrochlorothiazide 100-12.5 MG tablet Commonly known as:  HYZAAR Take 1 tablet  by mouth daily.   methocarbamol 500 MG tablet Commonly known as:  ROBAXIN Take 1 tablet (500 mg total) by mouth every 6 (six) hours as needed for muscle spasms.   metoprolol succinate 25 MG 24 hr tablet Commonly known as:  TOPROL-XL Take 1 tablet (25 mg total) by mouth daily. Start taking on:  04/13/2018   multivitamin with minerals Tabs tablet Take 1 tablet by mouth daily. One-A-Day for Men 50+   naproxen 500 MG tablet Commonly known as:  NAPROSYN Take 500 mg by mouth daily as needed for mild pain or moderate pain.   oxyCODONE-acetaminophen 5-325 MG tablet Commonly known as:  PERCOCET/ROXICET Take 1-2 tablets by  mouth every 4 (four) hours as needed for moderate pain or severe pain. What changed:    how much to take  when to take this   tiZANidine 4 MG tablet Commonly known as:  ZANAFLEX Take 4 mg by mouth 2 (two) times daily.   zolpidem 10 MG tablet Commonly known as:  AMBIEN Take 10 mg by mouth at bedtime as needed for sleep.       Diagnostic Studies: No results found.  Disposition: Discharge disposition: 01-Home or Self Care       Discharge Instructions    Call MD / Call 911   Complete by:  As directed    If you experience chest pain or shortness of breath, CALL 911 and be transported to the hospital emergency room.  If you develope a fever above 101 F, pus (white drainage) or increased drainage or redness at the wound, or calf pain, call your surgeon's office.   Constipation Prevention   Complete by:  As directed    Drink plenty of fluids.  Prune juice may be helpful.  You may use a stool softener, such as Colace (over the counter) 100 mg twice a day.  Use MiraLax (over the counter) for constipation as needed.   Diet - low sodium heart healthy   Complete by:  As directed    Increase activity slowly as tolerated   Complete by:  As directed       Follow-up Information    Susa Day, MD Follow up in 2 week(s).   Specialty:  Orthopedic Surgery Contact information: [address] [place] [address] [phone]            Signed: Cecilie Kicks. 04/12/2018, 1:08 PM

## 2018-04-12 NOTE — Progress Notes (Addendum)
Subjective: 2 Days Post-Op Procedure(s) (LRB): Left knee arthroscopy, debridement, chondroplasty patella, possible menisectomy (Left) Mini open quad tendon repair (Left) Patient reports pain as mild.  Pain improved. No CP, SOB, fever, chills, N/V. No c/o.   Objective: Vital signs in last 24 hours: Temp:  [97.9 F (36.6 C)-98.5 F (36.9 C)] 97.9 F (36.6 C) (09/13 0516) Pulse Rate:  [84-90] 89 (09/13 0516) Resp:  [18] 18 (09/13 0516) BP: (168-189)/(90-110) 171/100 (09/13 0516) SpO2:  [96 %-99 %] 97 % (09/13 0516)  Intake/Output from previous day: 09/12 0701 - 09/13 0700 In: 1072 [P.O.:720; I.V.:352] Out: 4250 [Urine:4250] Intake/Output this shift: No intake/output data recorded.  Recent Labs    04/11/18 1146  HGB 17.0   Recent Labs    04/11/18 1146  WBC 17.6*  RBC 6.06*  HCT 50.2  PLT 225   Recent Labs    04/11/18 1146  NA 140  K 4.0  CL 101  CO2 27  BUN 13  CREATININE 0.96  GLUCOSE 115*  CALCIUM 9.8   No results for input(s): LABPT, INR in the last 72 hours.  Neurologically intact ABD soft Neurovascular intact Sensation intact distally Intact pulses distally Dorsiflexion/Plantar flexion intact Incision: dressing C/D/I and no drainage No cellulitis present Compartment soft TROM brace in place. No calf pain or sign of DVT.  Assessment/Plan: 2 Days Post-Op Procedure(s) (LRB): Left knee arthroscopy, debridement, chondroplasty patella, possible menisectomy (Left) Mini open quad tendon repair (Left) Advance diet Up with therapy D/C IV fluids Discussed D/C instructions for his knee. Continue TROM, PWB in TROM, trying to keep weight on the heel. [place] for D/C, I did discuss this with his case mgr yesterday. Will contact Dr. 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 Possible D/C later today if stable from medical standpoint. Discussed with Dr. Mliss Fritz, [place]. 04/12/2018, 9:06 AM   Spoke with Dr. Verlon Au, discussed  events of VTach, apparently had 2 runs of that yesterday. Still hypertensive this AM. He recommended checking magnesium level. We discussed possibly increasing dose of metoprolol for D/C. Magnesium order placed STAT. He does feel pt is stable for D/C today. Depending on mag level may also need to add Mag Ox for D/C. Will await lab results. Will discuss with Dr. Tonita Cong.

## 2018-04-12 NOTE — Progress Notes (Signed)
Spoke with pt concerning the need of a RW.  Pt is Worker's Comp, his CM will need to approve any DME need. Pt gave number to his CM Alric Quan, RN [phone].  This information was given to in house rep for [facility]

## 2018-07-18 ENCOUNTER — Ambulatory Visit (HOSPITAL_COMMUNITY)
Admission: EM | Admit: 2018-07-18 | Discharge: 2018-07-18 | Disposition: A | Discharge status: Home / Self Care | Payer: Medicaid Other | Attending: Family Medicine | Admitting: Family Medicine

## 2018-07-18 ENCOUNTER — Encounter (HOSPITAL_COMMUNITY): Payer: Self-pay | Admitting: Emergency Medicine

## 2018-07-18 ENCOUNTER — Other Ambulatory Visit: Payer: Self-pay

## 2018-07-18 DIAGNOSIS — J069 Acute upper respiratory infection, unspecified: Secondary | ICD-10-CM | POA: Diagnosis not present

## 2018-07-18 MED ORDER — AMOXICILLIN 875 MG PO TABS: 875.0000 mg | ORAL_TABLET | Freq: Two times a day (BID) | ORAL | 0 refills | Status: DC

## 2018-07-18 MED ORDER — BENZONATATE 200 MG PO CAPS: 200.0000 mg | ORAL_CAPSULE | Freq: Two times a day (BID) | ORAL | 0 refills | Status: DC | PRN

## 2018-07-18 MED ORDER — IPRATROPIUM BROMIDE 0.03 % NA SOLN: 2.0000 | Freq: Two times a day (BID) | NASAL | 0 refills | Status: DC

## 2018-07-18 NOTE — ED Triage Notes (Addendum)
Pt complains of body aches nasal congestion, dry cough and diarrhea that started yesterday.  Pt taking 1600mg   Ibuprofen and Nyquil.  Pt was educated on not taking that much Ibuprofen at one time.

## 2018-07-18 NOTE — ED Provider Notes (Signed)
[facility]    CSN: [id number] Arrival date & time: 07/18/18  1047     History   Chief Complaint Chief Complaint  Patient presents with  . URI    HPI Zachary Powers is a 52 y.o. male.   HPI  Patient has had cold symptoms since yesterday.  Runny stuffy nose.  Clear mucus.  Mild sore throat.  Cough.  Clear sputum.  No fever chills.  No body aches.  No nausea or vomiting.  No sinus pressure or pain.  No headache.  No ear pressure or pain. The patient is here with about 24 symptoms.  He states he "always" needs an antibiotic to get better.  He states that he is here for an antibiotic prescription so he can "catch it early".  He is  a smoker.  He does not have any underlying heart or lung disease.  He does have hypertension, but did not take his medication today.  His blood pressure is elevated and this is discussed with him.  Past Medical History:  Diagnosis Date  . Anxiety   . Asthma   . Back pain   . Depression   . GERD (gastroesophageal reflux disease)   . Hypertension     Patient Active Problem List   Diagnosis Date Noted  . Tendon rupture, traumatic, patella 04/10/2018    Past Surgical History:  Procedure Laterality Date  . HAND SURGERY Left 1991  . KNEE ARTHROSCOPY Left 04/10/2018   Procedure: Left knee arthroscopy, debridement, chondroplasty patella, possible menisectomy;  Surgeon: Susa Day, MD;  Location: [facility];  Service: Orthopedics;  Laterality: Left;  2 hrs for both procedures  . QUADRICEPS TENDON REPAIR Left 04/10/2018   Procedure: Mini open quad tendon repair;  Surgeon: Susa Day, MD;  Location: [facility];  Service: Orthopedics;  Laterality: Left;       Home Medications    Prior to Admission medications   Medication Sig Start Date End Date Taking? Authorizing Provider  aspirin EC 325 MG EC tablet Take 1 tablet (325 mg total) by mouth daily with breakfast. 04/12/18  Yes Lacie Draft M, PA-C  docusate sodium (COLACE) 100 MG capsule  Take 1 capsule (100 mg total) by mouth 2 (two) times daily as needed. 04/10/18 04/10/19 Yes Cecilie Kicks, PA-C  LORazepam (ATIVAN) 0.5 MG tablet Take 0.5 mg by mouth 3 (three) times daily as needed for anxiety. 03/22/18  Yes [provider]  losartan-hydrochlorothiazide (HYZAAR) 100-12.5 MG tablet Take 1 tablet by mouth daily. 02/04/18  Yes [provider]  metoprolol succinate (TOPROL-XL) 25 MG 24 hr tablet Take 1 tablet (25 mg total) by mouth daily. 04/13/18  Yes Lacie Draft M, PA-C  Multiple Vitamin (MULTIVITAMIN WITH MINERALS) TABS tablet Take 1 tablet by mouth daily. One-A-Day for Men 50+   Yes [provider]  zolpidem (AMBIEN) 10 MG tablet Take 10 mg by mouth at bedtime as needed for sleep.   Yes [provider]  amoxicillin (AMOXIL) 875 MG tablet Take 1 tablet (875 mg total) by mouth 2 (two) times daily. 07/18/18   Raylene Everts, MD  benzonatate (TESSALON) 200 MG capsule Take 1 capsule (200 mg total) by mouth 2 (two) times daily as needed for cough. 07/18/18   Raylene Everts, MD  ipratropium (ATROVENT) 0.03 % nasal spray Place 2 sprays into both nostrils every 12 (twelve) hours. 07/18/18   Raylene Everts, MD  methocarbamol (ROBAXIN) 500 MG tablet Take 1 tablet (500 mg total)  by mouth every 6 (six) hours as needed for muscle spasms. 04/10/18   Cecilie Kicks, PA-C  naproxen (NAPROSYN) 500 MG tablet Take 500 mg by mouth daily as needed for mild pain or moderate pain.     [provider]  oxyCODONE-acetaminophen (PERCOCET/ROXICET) 5-325 MG tablet Take 1-2 tablets by mouth every 4 (four) hours as needed for moderate pain or severe pain. 04/10/18   Cecilie Kicks, PA-C  tiZANidine (ZANAFLEX) 4 MG tablet Take 4 mg by mouth 2 (two) times daily.    [provider]    Family History Family History  Problem Relation Age of Onset  . Diabetes Father   . Heart disease Father   . Diabetes Paternal Grandmother   . Colon cancer  Neg Hx     Social History Social History   Tobacco Use  . Smoking status: Current Every Day Smoker    Packs/day: 1.00    Types: Cigarettes  . Smokeless tobacco: Never Used  Substance Use Topics  . Alcohol use: No    Alcohol/week: 0.0 standard drinks  . Drug use: No    Comment: no use in 6 years     Allergies   Barbiturates   Review of Systems Review of Systems  Constitutional: Negative for chills and fever.  HENT: Positive for congestion, rhinorrhea and sore throat. Negative for ear pain.   Eyes: Negative for pain and visual disturbance.  Respiratory: Positive for cough. Negative for shortness of breath.   Cardiovascular: Negative for chest pain and palpitations.  Gastrointestinal: Negative for abdominal pain and vomiting.  Genitourinary: Negative for dysuria and hematuria.  Musculoskeletal: Negative for arthralgias and back pain.  Skin: Negative for color change and rash.  Neurological: Negative for seizures and syncope.  All other systems reviewed and are negative.    Physical Exam Triage Vital Signs ED Triage Vitals  Enc Vitals Group     BP 07/18/18 1225 (!) 165/100     Pulse Rate 07/18/18 1225 83     Resp 07/18/18 1225 18     Temp 07/18/18 1225 98.4 F (36.9 C)     Temp Source 07/18/18 1225 Oral     SpO2 07/18/18 1225 98 %     Weight --      Height --      Head Circumference --      Peak Flow --      Pain Score 07/18/18 1231 0     Pain Loc --      Pain Edu? --      Excl. in [place]? --    No data found.  Updated Vital Signs BP (!) 165/100 (BP Location: Right Arm)   Pulse 83   Temp 98.4 F (36.9 C) (Oral)   Resp 18   SpO2 98%      Physical Exam Constitutional:      General: He is not in acute distress.    Appearance: Normal appearance. He is well-developed.  HENT:     Head: Normocephalic and atraumatic.     Right Ear: Tympanic membrane and ear canal normal.     Left Ear: Tympanic membrane and ear canal normal.     Nose: Congestion and  rhinorrhea present.     Mouth/Throat:     Mouth: Mucous membranes are moist.     Pharynx: No posterior oropharyngeal erythema.  Eyes:     Conjunctiva/sclera: Conjunctivae normal.     Pupils: Pupils are equal, round, and reactive to light.  Neck:  Musculoskeletal: Normal range of motion and neck supple.  Cardiovascular:     Rate and Rhythm: Normal rate and regular rhythm.     Heart sounds: Normal heart sounds.  Pulmonary:     Effort: Pulmonary effort is normal. No respiratory distress.     Breath sounds: Normal breath sounds. No rhonchi.  Abdominal:     General: There is no distension.     Palpations: Abdomen is soft.  Musculoskeletal: Normal range of motion.  Lymphadenopathy:     Cervical: No cervical adenopathy.  Skin:    General: Skin is warm and dry.  Neurological:     General: No focal deficit present.     Mental Status: He is alert. Mental status is at baseline.  Psychiatric:        Mood and Affect: Mood normal.        Thought Content: Thought content normal.      UC Treatments / Results  Labs (all labs ordered are listed, but only abnormal results are displayed) Labs Reviewed - No data to display  EKG None  Radiology No results found.  Procedures Procedures (including critical care time)  Medications Ordered in UC Medications - No data to display  Initial Impression / Assessment and Plan / UC Course  I have reviewed the triage vital signs and the nursing notes.  Pertinent labs & imaging results that were available during my care of the patient were reviewed by me and considered in my medical decision making (see chart for details).     I reviewed with this patient the CDC guidelines regarding appropriate use of antibiotics.  With 24 hours of cold symptoms, no underlying significant disease, an antibiotic is not indicated.  Not only will not help him, could potentially cause side effects that would worsen his condition.  This patient is not open to the  suggestion that he can go home and be treated with over-the-counter products.  I have given him a prescription for amoxicillin with instructions to fill and take this medicine if symptoms last longer than a week, or if he spikes a fever over 102. Final Clinical Impressions(s) / UC Diagnoses   Final diagnoses:  Viral upper respiratory tract infection     Discharge Instructions     Take OTC cold medicine as needed Mucinex DM or Coricidin HBP are safe for people with high blood pressure Use atrovent for the runny nose Tessalon as needed for cough Fill and take the antibiotic if you  have worsening symptoms or fail to see improvement as expected in a few days Drink plenty of water See your PCP for BP re check, it is elevated today   ED Prescriptions    Medication Sig Dispense Auth. Provider   ipratropium (ATROVENT) 0.03 % nasal spray Place 2 sprays into both nostrils every 12 (twelve) hours. 30 mL Raylene Everts, MD   amoxicillin (AMOXIL) 875 MG tablet Take 1 tablet (875 mg total) by mouth 2 (two) times daily. 14 tablet Raylene Everts, MD   benzonatate (TESSALON) 200 MG capsule Take 1 capsule (200 mg total) by mouth 2 (two) times daily as needed for cough. 20 capsule Raylene Everts, MD     Controlled Substance Prescriptions [place] Controlled Substance Registry consulted? Not Applicable   Raylene Everts, MD 07/18/18 1340

## 2018-07-18 NOTE — Discharge Instructions (Addendum)
Take OTC cold medicine as needed Mucinex DM or Coricidin HBP are safe for people with high blood pressure Use atrovent for the runny nose Tessalon as needed for cough Fill and take the antibiotic if you  have worsening symptoms or fail to see improvement as expected in a few days Drink plenty of water See your PCP for BP re check, it is elevated today

## 2019-01-27 ENCOUNTER — Encounter (HOSPITAL_COMMUNITY): Payer: Self-pay | Admitting: Emergency Medicine

## 2019-01-27 ENCOUNTER — Inpatient Hospital Stay (HOSPITAL_COMMUNITY)
Admission: EM | Admit: 2019-01-27 | Discharge: 2019-01-29 | DRG: 065 | Disposition: A | Discharge status: Home / Self Care | Payer: Medicaid Other | Attending: Internal Medicine | Admitting: Internal Medicine

## 2019-01-27 ENCOUNTER — Emergency Department (HOSPITAL_COMMUNITY): Payer: Medicaid Other

## 2019-01-27 ENCOUNTER — Other Ambulatory Visit: Payer: Self-pay

## 2019-01-27 ENCOUNTER — Ambulatory Visit (INDEPENDENT_AMBULATORY_CARE_PROVIDER_SITE_OTHER)
Admission: EM | Admit: 2019-01-27 | Discharge: 2019-01-27 | Disposition: A | Discharge status: Other Acute Inpatient Hospital | Payer: Medicaid Other | Source: Home / Self Care | Attending: Family Medicine | Admitting: Family Medicine

## 2019-01-27 DIAGNOSIS — R2689 Other abnormalities of gait and mobility: Secondary | ICD-10-CM | POA: Diagnosis present

## 2019-01-27 DIAGNOSIS — H9192 Unspecified hearing loss, left ear: Secondary | ICD-10-CM | POA: Diagnosis present

## 2019-01-27 DIAGNOSIS — G9389 Other specified disorders of brain: Secondary | ICD-10-CM | POA: Diagnosis present

## 2019-01-27 DIAGNOSIS — Z720 Tobacco use: Secondary | ICD-10-CM

## 2019-01-27 DIAGNOSIS — Z1159 Encounter for screening for other viral diseases: Secondary | ICD-10-CM

## 2019-01-27 DIAGNOSIS — Z833 Family history of diabetes mellitus: Secondary | ICD-10-CM

## 2019-01-27 DIAGNOSIS — I639 Cerebral infarction, unspecified: Principal | ICD-10-CM | POA: Diagnosis present

## 2019-01-27 DIAGNOSIS — E669 Obesity, unspecified: Secondary | ICD-10-CM | POA: Diagnosis present

## 2019-01-27 DIAGNOSIS — R55 Syncope and collapse: Secondary | ICD-10-CM | POA: Diagnosis not present

## 2019-01-27 DIAGNOSIS — F1411 Cocaine abuse, in remission: Secondary | ICD-10-CM | POA: Diagnosis present

## 2019-01-27 DIAGNOSIS — Z823 Family history of stroke: Secondary | ICD-10-CM

## 2019-01-27 DIAGNOSIS — R29700 NIHSS score 0: Secondary | ICD-10-CM | POA: Diagnosis present

## 2019-01-27 DIAGNOSIS — Z832 Family history of diseases of the blood and blood-forming organs and certain disorders involving the immune mechanism: Secondary | ICD-10-CM

## 2019-01-27 DIAGNOSIS — Z6829 Body mass index (BMI) 29.0-29.9, adult: Secondary | ICD-10-CM

## 2019-01-27 DIAGNOSIS — I10 Essential (primary) hypertension: Secondary | ICD-10-CM

## 2019-01-27 DIAGNOSIS — F17210 Nicotine dependence, cigarettes, uncomplicated: Secondary | ICD-10-CM | POA: Diagnosis present

## 2019-01-27 DIAGNOSIS — Z8249 Family history of ischemic heart disease and other diseases of the circulatory system: Secondary | ICD-10-CM

## 2019-01-27 DIAGNOSIS — H532 Diplopia: Secondary | ICD-10-CM | POA: Diagnosis present

## 2019-01-27 DIAGNOSIS — Q211 Atrial septal defect: Secondary | ICD-10-CM

## 2019-01-27 DIAGNOSIS — F1011 Alcohol abuse, in remission: Secondary | ICD-10-CM | POA: Diagnosis present

## 2019-01-27 DIAGNOSIS — I776 Arteritis, unspecified: Secondary | ICD-10-CM | POA: Diagnosis present

## 2019-01-27 DIAGNOSIS — E785 Hyperlipidemia, unspecified: Secondary | ICD-10-CM | POA: Diagnosis present

## 2019-01-27 LAB — URINALYSIS, ROUTINE W REFLEX MICROSCOPIC
Bilirubin Urine: NEGATIVE
Glucose, UA: NEGATIVE mg/dL
Hgb urine dipstick: NEGATIVE
Ketones, ur: NEGATIVE mg/dL
Leukocytes,Ua: NEGATIVE
Nitrite: NEGATIVE
Protein, ur: NEGATIVE mg/dL
Specific Gravity, Urine: 1.016 (ref 1.005–1.030)
pH: 6 (ref 5.0–8.0)

## 2019-01-27 LAB — BASIC METABOLIC PANEL
Anion gap: 8 (ref 5–15)
BUN: 10 mg/dL (ref 6–20)
CO2: 25 mmol/L (ref 22–32)
Calcium: 9.7 mg/dL (ref 8.9–10.3)
Chloride: 106 mmol/L (ref 98–111)
Creatinine, Ser: 0.88 mg/dL (ref 0.61–1.24)
GFR calc Af Amer: >60 mL/min (ref >60)
GFR calc non Af Amer: >60 mL/min (ref >60)
Glucose, Bld: 96 mg/dL (ref 70–99)
Potassium: 4.1 mmol/L (ref 3.5–5.1)
Sodium: 139 mmol/L (ref 135–145)

## 2019-01-27 LAB — CBC
HCT: 48.5 % (ref 39.0–52.0)
Hemoglobin: 15.8 g/dL (ref 13.0–17.0)
MCH: 27.1 pg (ref 26.0–34.0)
MCHC: 32.6 g/dL (ref 30.0–36.0)
MCV: 83 fL (ref 80.0–100.0)
Platelets: 228 10*3/uL (ref 150–400)
RBC: 5.84 MIL/uL — ABNORMAL HIGH (ref 4.22–5.81)
RDW: 14.2 % (ref 11.5–15.5)
WBC: 7.4 10*3/uL (ref 4.0–10.5)
nRBC: 0 % (ref 0.0–0.2)

## 2019-01-27 MED ORDER — SODIUM CHLORIDE 0.9% FLUSH: 3.0000 mL | Freq: Once | INTRAVENOUS | Status: DC

## 2019-01-27 MED ORDER — ACETAMINOPHEN 325 MG PO TABS
650.0000 mg | ORAL_TABLET | Freq: Once | ORAL | Status: AC
  Administered 2019-01-27: 650 mg via ORAL
  Filled 2019-01-27: qty 2

## 2019-01-27 NOTE — ED Provider Notes (Signed)
[facility] EMERGENCY DEPARTMENT Provider Note   CSN: [id number] Arrival date & time: 01/27/19  1316     History   Chief Complaint Chief Complaint  Patient presents with   Near Syncope    HPI Zachary Powers is a 53 y.o. male.     Patient with history of hypertension presents the emergency department today after being seen at urgent care with complaint of headache, double vision, off-balance sensation.  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  Patient had acute onset of double vision and feeling off balance.  He did not feel particularly lightheaded or like he was going to pass out.  He did not have a sensation of spinning or vertigo.  Patient states that he went to the bathroom to splash some water on his face but did not feel much better.  And all symptoms lasted 10 to 15 minutes and then resolved.  Patient had a subsequent moderate headache that was worse on the left side.  He also saw some spots in his vision that persisted until about an hour prior to my exam.  Patient does not have any history of migraine headaches.  No history of stroke or TIA.  He has been on blood pressure medications but states that he is recently been trying to take nitric oxide tablets to control his blood pressure.  He states that he is taken these for about 3 to 4 weeks.  Patient back to near baseline at the current time.  No recent symptoms of illness.  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  He does smoke cigarettes.  No history of diabetes or high cholesterol.  States that his mother had a history of stroke.  The onset of this condition was acute. The course is constant. Aggravating factors: none. Alleviating factors: none.       Past Medical History:  Diagnosis Date   Anxiety    Asthma    Back pain    Depression    GERD (gastroesophageal reflux disease)    Hypertension      Patient Active Problem List   Diagnosis Date Noted   Tendon rupture, traumatic, patella 04/10/2018    Past Surgical History:  Procedure Laterality Date   HAND SURGERY Left 1991   KNEE ARTHROSCOPY Left 04/10/2018   Procedure: Left knee arthroscopy, debridement, chondroplasty patella, possible menisectomy;  Surgeon: Susa Day, MD;  Location: [facility];  Service: Orthopedics;  Laterality: Left;  2 hrs for both procedures   QUADRICEPS TENDON REPAIR Left 04/10/2018   Procedure: Mini open quad tendon repair;  Surgeon: Susa Day, MD;  Location: [facility];  Service: Orthopedics;  Laterality: Left;        Home Medications    Prior to Admission medications   Medication Sig Start Date End Date Taking? Authorizing Provider  amoxicillin (AMOXIL) 875 MG tablet Take 1 tablet (875 mg total) by mouth 2 (two) times daily. Patient not taking: Reported on 01/27/2019 07/18/18   Raylene Everts, MD  aspirin EC 325 MG EC tablet Take 1 tablet (325 mg total) by mouth daily with breakfast. 04/12/18   Cecilie Kicks, PA-C  benzonatate (TESSALON) 200 MG capsule Take 1 capsule (200 mg total) by mouth 2 (two) times daily as needed for cough. Patient not taking: Reported on 01/27/2019 07/18/18   Raylene Everts, MD  docusate sodium (COLACE) 100  MG capsule Take 1 capsule (100 mg total) by mouth 2 (two) times daily as needed. 04/10/18 04/10/19  Cecilie Kicks, PA-C  ipratropium (ATROVENT) 0.03 % nasal spray Place 2 sprays into both nostrils every 12 (twelve) hours. 07/18/18   Raylene Everts, MD  LORazepam (ATIVAN) 0.5 MG tablet Take 0.5 mg by mouth 3 (three) times daily as needed for anxiety. 03/22/18   [provider]  losartan-hydrochlorothiazide (HYZAAR) 100-12.5 MG tablet Take 1 tablet by mouth daily. 02/04/18   [provider]  methocarbamol (ROBAXIN) 500 MG tablet Take 1 tablet (500 mg total) by mouth every 6 (six) hours as needed for muscle spasms. 04/10/18   Cecilie Kicks, PA-C  metoprolol succinate (TOPROL-XL) 25 MG 24 hr tablet Take 1 tablet (25 mg total) by mouth daily. 04/13/18   Cecilie Kicks, PA-C  Multiple Vitamin (MULTIVITAMIN WITH MINERALS) TABS tablet Take 1 tablet by mouth daily. One-A-Day for Men 50+    [provider]  naproxen (NAPROSYN) 500 MG tablet Take 500 mg by mouth daily as needed for mild pain or moderate pain.     [provider]  oxyCODONE-acetaminophen (PERCOCET/ROXICET) 5-325 MG tablet Take 1-2 tablets by mouth every 4 (four) hours as needed for moderate pain or severe pain. Patient not taking: Reported on 01/27/2019 04/10/18   Cecilie Kicks, PA-C  tiZANidine (ZANAFLEX) 4 MG tablet Take 4 mg by mouth 2 (two) times daily.    [provider]  zolpidem (AMBIEN) 10 MG tablet Take 10 mg by mouth at bedtime as needed for sleep.    [provider]    Family History Family History  Problem Relation Age of Onset   Diabetes Father    Heart disease Father    Diabetes Paternal Grandmother    Colon cancer Neg Hx     Social History Social History   Tobacco Use   Smoking status: Current Every Day Smoker    Packs/day: 0.50    Types: Cigarettes   Smokeless tobacco: Never Used  Substance Use Topics   Alcohol use: No    Alcohol/week: 0.0 standard drinks   Drug use: No    Comment: no use in 6 years     Allergies   Barbiturates   Review of Systems Review of Systems  Constitutional: Negative for fever.  HENT: Negative for congestion, dental problem, rhinorrhea and sinus pressure.   Eyes: Positive for visual disturbance. Negative for photophobia, discharge and redness.  Respiratory: Negative for shortness of breath.   Cardiovascular: Negative for chest pain.  Gastrointestinal: Negative for nausea and vomiting.  Musculoskeletal: Positive for gait problem. Negative for neck pain and neck stiffness.  Skin: Negative for rash.  Neurological: Positive for dizziness and headaches. Negative  for syncope, speech difficulty, weakness, light-headedness and numbness.  Psychiatric/Behavioral: Negative for confusion.     Physical Exam Updated Vital Signs BP (!) 152/90 (BP Location: Left Arm)    Pulse 66    Temp 98.4 F (36.9 C) (Oral)    Resp 16    SpO2 98%   Physical Exam Vitals signs and nursing note reviewed.  Constitutional:      Appearance: He is well-developed.  HENT:     Head: Normocephalic and atraumatic.     Right Ear: Tympanic membrane, ear canal and external ear normal.     Left Ear: Tympanic membrane, ear canal and external ear normal.     Nose: Nose normal.     Mouth/Throat:  Pharynx: Uvula midline.  Eyes:     General: Lids are normal.     Conjunctiva/sclera: Conjunctivae normal.     Pupils: Pupils are equal, round, and reactive to light.  Neck:     Musculoskeletal: Normal range of motion and neck supple.     Comments: No carotid bruit heard. Cardiovascular:     Rate and Rhythm: Normal rate and regular rhythm.  Pulmonary:     Effort: Pulmonary effort is normal.     Breath sounds: Normal breath sounds.  Abdominal:     Palpations: Abdomen is soft.     Tenderness: There is no abdominal tenderness.  Musculoskeletal: Normal range of motion.     Cervical back: He exhibits normal range of motion, no tenderness and no bony tenderness.  Skin:    General: Skin is warm and dry.  Neurological:     Mental Status: He is alert and oriented to person, place, and time.     GCS: GCS eye subscore is 4. GCS verbal subscore is 5. GCS motor subscore is 6.     Cranial Nerves: No cranial nerve deficit.     Sensory: No sensory deficit.     Motor: No abnormal muscle tone.     Coordination: Coordination normal.     Gait: Gait normal.     Deep Tendon Reflexes: Reflexes are normal and symmetric.      ED Treatments / Results  Labs (all labs ordered are listed, but only abnormal results are displayed) Labs Reviewed  CBC - Abnormal; Notable for the following  components:      Result Value   RBC 5.84 (*)    All other components within normal limits  SARS CORONAVIRUS 2 (HOSPITAL ORDER, [facility])  BASIC METABOLIC PANEL  URINALYSIS, ROUTINE W REFLEX MICROSCOPIC  RAPID URINE DRUG SCREEN, HOSP PERFORMED    EKG EKG Interpretation  Date/Time:  Monday January 27 2019 13:26:55 EDT Ventricular Rate:  70 PR Interval:  164 QRS Duration: 74 QT Interval:  380 QTC Calculation: 410 R Axis:   46 Text Interpretation:  Normal sinus rhythm Cannot rule out Anterior infarct , age undetermined Abnormal ECG Confirmed by Aletta Edouard [phone]) on 01/27/2019 4:08:18 PM   Radiology Ct Head Wo Contrast  Result Date: 01/27/2019 CLINICAL DATA:  TIA, double vision, dizziness EXAM: CT HEAD WITHOUT CONTRAST TECHNIQUE: Contiguous axial images were obtained from the base of the skull through the vertex without intravenous contrast. COMPARISON:  MR brain, 12/26/2013 FINDINGS: Brain: There is encephalomalacia of the anterior right corona radiata (series 3, image 22). There may be additional hypodensity of the inferior pons or medulla versus streak artifact from the skull base (series 3, image 9). Vascular: No hyperdense vessel or unexpected calcification. Skull: Normal. Negative for fracture or focal lesion. Sinuses/Orbits: No acute finding. Other: None. IMPRESSION: No definite acute intracranial pathology. There is encephalomalacia of the anterior right corona radiata (series 3, image 22). There may be additional hypodensity of the inferior pons or medulla consistent with age indeterminate lacunar infarction versus streak artifact from the skull base (series 3, image 9). Consider MRI to more sensitively evaluate for acute diffusion restricting infarction if indicated. Electronically Signed   By: Eddie Candle M.D.   On: 01/27/2019 17:12   Mr Brain Wo Contrast  Result Date: 01/27/2019 CLINICAL DATA:  52 year old male with episode of dizziness this  morning, visual changes. EXAM: MRI HEAD WITHOUT CONTRAST TECHNIQUE: Multiplanar, multiecho pulse sequences of the brain and surrounding structures  were obtained without intravenous contrast. COMPARISON:  Head CT 1655 hours today.  Brain MRI 12/26/2013. FINDINGS: Brain: Tiny cortical area of restricted diffusion in the posterior medial right parietal lobe is seen on both axial and coronal diffusion weighted imaging (series 5, image 80). No other restricted diffusion. 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 Associated pericallosal T2 and FLAIR hyperintensity in both hemispheres, new since 2015. Some of these areas have questionable chronic hemosiderin (series 14, image 38). Elsewhere gray and white matter signal is within normal limits. The deep gray matter nuclei, brainstem, and cerebellum appear normal. No midline shift, mass effect, evidence of mass lesion, ventriculomegaly, extra-axial collection or acute intracranial hemorrhage. Cervicomedullary junction and pituitary are within normal limits. Vascular: Major intracranial vascular flow voids are stable since 2015 with dominant appearing left vertebral artery. Skull and upper cervical spine: Negative visible cervical spine. Visualized bone marrow signal is within normal limits. Sinuses/Orbits: Negative orbits.  Paranasal sinuses remain clear. Other: Mastoids remain clear. Visible internal auditory structures appear normal. Scalp and face soft tissues appear negative. IMPRESSION: 1. Tiny cortical area of restricted diffusion in the posterior right parietal lobe might reflect acute ischemia. 2. However, there are unusual bilateral pericallosal signal changes, including encephalomalacia of some of the right cingulate gyrus, which are new since a 2015 MRI which was done for hearing loss. 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 Electronically Signed   By: Genevie Ann M.D.   On: 01/27/2019 23:24    Procedures Procedures (including critical care time)  Medications Ordered in ED Medications  sodium chloride flush (NS) 0.9 % injection 3 mL (has no administration in time range)  acetaminophen (TYLENOL) tablet 650 mg (650 mg Oral Given 01/27/19 2226)     Initial Impression / Assessment and Plan / ED Course  I have reviewed the triage vital signs and the nursing notes.  Pertinent labs & imaging results that were available during my care of the patient were reviewed by me and considered in my medical decision making (see chart for details).        Patient seen and examined. Work-up initiated.  Reviewed EKG.  Patient with no neurological deficits at time of exam.  Will obtain head CT.  Differential diagnosis includes near syncope, complex migraine, CVA/TIA.  No neck pain to suggest carotid dissection or vertebral dissection.  Patient does have appropriate PCP follow-up.  Vital signs reviewed and are as follows: BP (!) 152/90 (BP Location: Left Arm)    Pulse 66    Temp 98.4 F (36.9 C) (Oral)    Resp 16    SpO2 98%   CT abnormal.  Patient updated.  Will need MRI.  MRI delayed due to high utilization.  MRI has an area suspicious for small ischemic stroke.  I discussed these findings and patient history with Dr. Rory Percy.  He agrees that this area appears to be a small ischemic stroke.  He will see patient.  Requests CT angiography of the head and neck.  Request medical admission for stroke work-up.  Patient updated and is in agreement.     Final Clinical Impressions(s) / ED Diagnoses   Final diagnoses:  Cerebrovascular accident (CVA), unspecified mechanism ([place])   Admit.   ED Discharge Orders    None       Carlisle Cater, Hershal Coria 01/28/19 [place],  Rebeca Alert, MD 01/28/19 [phone]

## 2019-01-27 NOTE — ED Triage Notes (Signed)
Pt here for dizziness while at work today that is now resolved; pt sts hx of same in past

## 2019-01-27 NOTE — ED Provider Notes (Signed)
[facility]    CSN: [id number] Arrival date & time: 01/27/19  1149     History   Chief Complaint Chief Complaint  Patient presents with  . Dizziness    HPI Zachary Powers is a 53 y.o. male.   Patient presents today with an episode this morning of dizziness which occurred when he first arrived at work and was walking around.  The episode lasted approximately 20 minutes.  He was dizzy and had to hold onto the walls to walk; he saw visual bright spots.  He has a history of hypertension and has not been taking his medications but instead has been using nitrous oxide inhaled.  He denies nausea, vomiting, diarrhea, shortness of breath, chest pain, focal weakness.  He denies loss of consciousness or any injury.  The history is provided by the patient. No language interpreter was used.    Past Medical History:  Diagnosis Date  . Anxiety   . Asthma   . Back pain   . Depression   . GERD (gastroesophageal reflux disease)   . Hypertension     Patient Active Problem List   Diagnosis Date Noted  . Tendon rupture, traumatic, patella 04/10/2018    Past Surgical History:  Procedure Laterality Date  . HAND SURGERY Left 1991  . KNEE ARTHROSCOPY Left 04/10/2018   Procedure: Left knee arthroscopy, debridement, chondroplasty patella, possible menisectomy;  Surgeon: Susa Day, MD;  Location: [facility];  Service: Orthopedics;  Laterality: Left;  2 hrs for both procedures  . QUADRICEPS TENDON REPAIR Left 04/10/2018   Procedure: Mini open quad tendon repair;  Surgeon: Susa Day, MD;  Location: [facility];  Service: Orthopedics;  Laterality: Left;       Home Medications    Prior to Admission medications   Medication Sig Start Date End Date Taking? Authorizing Provider  amoxicillin (AMOXIL) 875 MG tablet Take 1 tablet (875 mg total) by mouth 2 (two) times daily. Patient not taking: Reported on 01/27/2019 07/18/18   Raylene Everts, MD  aspirin EC 325 MG EC tablet Take 1 tablet  (325 mg total) by mouth daily with breakfast. 04/12/18   Cecilie Kicks, PA-C  benzonatate (TESSALON) 200 MG capsule Take 1 capsule (200 mg total) by mouth 2 (two) times daily as needed for cough. Patient not taking: Reported on 01/27/2019 07/18/18   Raylene Everts, MD  docusate sodium (COLACE) 100 MG capsule Take 1 capsule (100 mg total) by mouth 2 (two) times daily as needed. 04/10/18 04/10/19  Cecilie Kicks, PA-C  ipratropium (ATROVENT) 0.03 % nasal spray Place 2 sprays into both nostrils every 12 (twelve) hours. 07/18/18   Raylene Everts, MD  LORazepam (ATIVAN) 0.5 MG tablet Take 0.5 mg by mouth 3 (three) times daily as needed for anxiety. 03/22/18   [provider]  losartan-hydrochlorothiazide (HYZAAR) 100-12.5 MG tablet Take 1 tablet by mouth daily. 02/04/18   [provider]  methocarbamol (ROBAXIN) 500 MG tablet Take 1 tablet (500 mg total) by mouth every 6 (six) hours as needed for muscle spasms. 04/10/18   Cecilie Kicks, PA-C  metoprolol succinate (TOPROL-XL) 25 MG 24 hr tablet Take 1 tablet (25 mg total) by mouth daily. 04/13/18   Cecilie Kicks, PA-C  Multiple Vitamin (MULTIVITAMIN WITH MINERALS) TABS tablet Take 1 tablet by mouth daily. One-A-Day for Men 50+    [provider]  naproxen (NAPROSYN) 500 MG tablet Take 500 mg by mouth daily as needed for mild pain  or moderate pain.     [provider]  oxyCODONE-acetaminophen (PERCOCET/ROXICET) 5-325 MG tablet Take 1-2 tablets by mouth every 4 (four) hours as needed for moderate pain or severe pain. Patient not taking: Reported on 01/27/2019 04/10/18   Cecilie Kicks, PA-C  tiZANidine (ZANAFLEX) 4 MG tablet Take 4 mg by mouth 2 (two) times daily.    [provider]  zolpidem (AMBIEN) 10 MG tablet Take 10 mg by mouth at bedtime as needed for sleep.    [provider]    Family History Family History  Problem Relation Age of Onset  . Diabetes Father   . Heart disease  Father   . Diabetes Paternal Grandmother   . Colon cancer Neg Hx     Social History Social History   Tobacco Use  . Smoking status: Current Every Day Smoker    Packs/day: 1.00    Types: Cigarettes  . Smokeless tobacco: Never Used  Substance Use Topics  . Alcohol use: No    Alcohol/week: 0.0 standard drinks  . Drug use: No    Comment: no use in 6 years     Allergies   Barbiturates   Review of Systems Review of Systems  Constitutional: Negative for chills and fever.  HENT: Negative for ear pain and sore throat.   Eyes: Negative for pain and visual disturbance.  Respiratory: Negative for cough and shortness of breath.   Cardiovascular: Negative for chest pain and palpitations.  Gastrointestinal: Negative for abdominal pain and vomiting.  Genitourinary: Negative for dysuria and hematuria.  Musculoskeletal: Negative for arthralgias and back pain.  Skin: Negative for color change and rash.  Neurological: Positive for light-headedness. Negative for seizures, facial asymmetry, speech difficulty and weakness.  All other systems reviewed and are negative.    Physical Exam Triage Vital Signs ED Triage Vitals  Enc Vitals Group     BP      Pulse      Resp      Temp      Temp src      SpO2      Weight      Height      Head Circumference      Peak Flow      Pain Score      Pain Loc      Pain Edu?      Excl. in [place]?    No data found.  Updated Vital Signs BP (!) 154/91 (BP Location: Right Arm)   Pulse 79   Temp 98.2 F (36.8 C) (Oral)   Resp 18   SpO2 96%   Visual Acuity Right Eye Distance:   Left Eye Distance:   Bilateral Distance:    Right Eye Near:   Left Eye Near:    Bilateral Near:     Physical Exam Vitals signs and nursing note reviewed.  Constitutional:      Appearance: He is well-developed.  HENT:     Head: Normocephalic and atraumatic.  Eyes:     Conjunctiva/sclera: Conjunctivae normal.  Neck:     Musculoskeletal: Neck supple.   Cardiovascular:     Rate and Rhythm: Normal rate and regular rhythm.     Heart sounds: No murmur.  Pulmonary:     Effort: Pulmonary effort is normal. No respiratory distress.     Breath sounds: Normal breath sounds.  Abdominal:     Palpations: Abdomen is soft.     Tenderness: There is no abdominal tenderness.  Skin:  General: Skin is warm and dry.  Neurological:     General: No focal deficit present.     Mental Status: He is alert and oriented to person, place, and time.     Cranial Nerves: No cranial nerve deficit.     Sensory: No sensory deficit.     Motor: No weakness.     Coordination: Coordination normal.     Gait: Gait normal.     Deep Tendon Reflexes: Reflexes normal.  Psychiatric:        Mood and Affect: Mood normal.        Behavior: Behavior normal.      UC Treatments / Results  Labs (all labs ordered are listed, but only abnormal results are displayed) Labs Reviewed - No data to display  EKG None  Radiology No results found.  Procedures Procedures (including critical care time)  Medications Ordered in UC Medications - No data to display  Initial Impression / Assessment and Plan / UC Course  I have reviewed the triage vital signs and the nursing notes.  Pertinent labs & imaging results that were available during my care of the patient were reviewed by me and considered in my medical decision making (see chart for details).   Presyncopal episode with visual disturbance and poorly controlled hypertensive male who has not been taking his medications and has been using inhaled nitrous oxide.  Discussed with Dr. Meda Coffee and decision made to send to emergency department.  Final Clinical Impressions(s) / UC Diagnoses   Final diagnoses:  Near syncope     Discharge Instructions     Go to ER.    ED Prescriptions    None     Controlled Substance Prescriptions  Controlled Substance Registry consulted? Not Applicable   Sharion Balloon, NP 01/27/19  1304

## 2019-01-27 NOTE — Discharge Instructions (Addendum)
Go to ER

## 2019-01-27 NOTE — ED Triage Notes (Signed)
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 Patient denies LOC, but states he felt lightheaded and his vision became spotty - denies both now. Denies feeling sick recently, no N/V/D. Also endorses headache that began after episode that has resolved.

## 2019-01-28 ENCOUNTER — Observation Stay (HOSPITAL_BASED_OUTPATIENT_CLINIC_OR_DEPARTMENT_OTHER): Payer: Medicaid Other

## 2019-01-28 ENCOUNTER — Emergency Department (HOSPITAL_COMMUNITY): Payer: Medicaid Other

## 2019-01-28 ENCOUNTER — Encounter (HOSPITAL_COMMUNITY): Payer: Self-pay | Admitting: Surgery

## 2019-01-28 ENCOUNTER — Inpatient Hospital Stay (HOSPITAL_COMMUNITY): Payer: Medicaid Other

## 2019-01-28 DIAGNOSIS — E669 Obesity, unspecified: Secondary | ICD-10-CM | POA: Diagnosis present

## 2019-01-28 DIAGNOSIS — F1011 Alcohol abuse, in remission: Secondary | ICD-10-CM | POA: Diagnosis present

## 2019-01-28 DIAGNOSIS — E785 Hyperlipidemia, unspecified: Secondary | ICD-10-CM | POA: Diagnosis present

## 2019-01-28 DIAGNOSIS — Z720 Tobacco use: Secondary | ICD-10-CM

## 2019-01-28 DIAGNOSIS — Z832 Family history of diseases of the blood and blood-forming organs and certain disorders involving the immune mechanism: Secondary | ICD-10-CM | POA: Diagnosis not present

## 2019-01-28 DIAGNOSIS — I639 Cerebral infarction, unspecified: Secondary | ICD-10-CM

## 2019-01-28 DIAGNOSIS — Q211 Atrial septal defect: Secondary | ICD-10-CM | POA: Diagnosis not present

## 2019-01-28 DIAGNOSIS — I63523 Cerebral infarction due to unspecified occlusion or stenosis of bilateral anterior cerebral arteries: Secondary | ICD-10-CM

## 2019-01-28 DIAGNOSIS — Z1159 Encounter for screening for other viral diseases: Secondary | ICD-10-CM | POA: Diagnosis not present

## 2019-01-28 DIAGNOSIS — I10 Essential (primary) hypertension: Secondary | ICD-10-CM | POA: Diagnosis present

## 2019-01-28 DIAGNOSIS — R2689 Other abnormalities of gait and mobility: Secondary | ICD-10-CM | POA: Diagnosis present

## 2019-01-28 DIAGNOSIS — F1411 Cocaine abuse, in remission: Secondary | ICD-10-CM | POA: Diagnosis present

## 2019-01-28 DIAGNOSIS — Z833 Family history of diabetes mellitus: Secondary | ICD-10-CM | POA: Diagnosis not present

## 2019-01-28 DIAGNOSIS — R29700 NIHSS score 0: Secondary | ICD-10-CM | POA: Diagnosis present

## 2019-01-28 DIAGNOSIS — Z8249 Family history of ischemic heart disease and other diseases of the circulatory system: Secondary | ICD-10-CM | POA: Diagnosis not present

## 2019-01-28 DIAGNOSIS — Z823 Family history of stroke: Secondary | ICD-10-CM | POA: Diagnosis not present

## 2019-01-28 DIAGNOSIS — I776 Arteritis, unspecified: Secondary | ICD-10-CM | POA: Diagnosis present

## 2019-01-28 DIAGNOSIS — H9192 Unspecified hearing loss, left ear: Secondary | ICD-10-CM | POA: Diagnosis present

## 2019-01-28 DIAGNOSIS — G9389 Other specified disorders of brain: Secondary | ICD-10-CM | POA: Diagnosis present

## 2019-01-28 DIAGNOSIS — Z6829 Body mass index (BMI) 29.0-29.9, adult: Secondary | ICD-10-CM | POA: Diagnosis not present

## 2019-01-28 DIAGNOSIS — R55 Syncope and collapse: Secondary | ICD-10-CM | POA: Diagnosis present

## 2019-01-28 DIAGNOSIS — I6329 Cerebral infarction due to unspecified occlusion or stenosis of other precerebral arteries: Secondary | ICD-10-CM | POA: Diagnosis not present

## 2019-01-28 DIAGNOSIS — H532 Diplopia: Secondary | ICD-10-CM | POA: Diagnosis present

## 2019-01-28 DIAGNOSIS — F17210 Nicotine dependence, cigarettes, uncomplicated: Secondary | ICD-10-CM | POA: Diagnosis present

## 2019-01-28 LAB — RAPID URINE DRUG SCREEN, HOSP PERFORMED
Amphetamines: NOT DETECTED
Barbiturates: NOT DETECTED
Benzodiazepines: NOT DETECTED
Cocaine: NOT DETECTED
Opiates: NOT DETECTED
Tetrahydrocannabinol: NOT DETECTED

## 2019-01-28 LAB — HIV ANTIBODY (ROUTINE TESTING W REFLEX): HIV Screen 4th Generation wRfx: NONREACTIVE

## 2019-01-28 LAB — ANTITHROMBIN III: AntiThromb III Func: 92 % (ref 75–120)

## 2019-01-28 LAB — ECHOCARDIOGRAM COMPLETE
Height: 70 in
Weight: 3312.19 oz

## 2019-01-28 LAB — SEDIMENTATION RATE: Sed Rate: 0 mm/hr (ref 0–16)

## 2019-01-28 LAB — LIPID PANEL
Cholesterol: 193 mg/dL (ref 0–200)
HDL: 34 mg/dL — ABNORMAL LOW (ref >40)
LDL Cholesterol: 130 mg/dL — ABNORMAL HIGH (ref 0–99)
Total CHOL/HDL Ratio: 5.7 RATIO
Triglycerides: 144 mg/dL (ref <150)
VLDL: 29 mg/dL (ref 0–40)

## 2019-01-28 LAB — HEMOGLOBIN A1C
Hgb A1c MFr Bld: 6.1 % — ABNORMAL HIGH (ref 4.8–5.6)
Mean Plasma Glucose: 128.37 mg/dL

## 2019-01-28 LAB — SARS CORONAVIRUS 2 BY RT PCR (HOSPITAL ORDER, PERFORMED IN ~~LOC~~ HOSPITAL LAB): SARS Coronavirus 2: NEGATIVE

## 2019-01-28 LAB — C-REACTIVE PROTEIN: CRP: <0.8 mg/dL (ref <1.0)

## 2019-01-28 MED ORDER — ENOXAPARIN SODIUM 40 MG/0.4ML ~~LOC~~ SOLN
40.0000 mg | SUBCUTANEOUS | Status: DC
  Administered 2019-01-28: 40 mg via SUBCUTANEOUS
  Filled 2019-01-28: qty 0.4

## 2019-01-28 MED ORDER — ASPIRIN EC 325 MG PO TBEC
325.0000 mg | DELAYED_RELEASE_TABLET | Freq: Every day | ORAL | Status: DC
  Administered 2019-01-29: 325 mg via ORAL
  Filled 2019-01-28: qty 1

## 2019-01-28 MED ORDER — PANTOPRAZOLE SODIUM 40 MG PO TBEC
40.0000 mg | DELAYED_RELEASE_TABLET | Freq: Every day | ORAL | Status: DC
  Administered 2019-01-28 – 2019-01-29 (×2): 40 mg via ORAL
  Filled 2019-01-28 (×2): qty 1

## 2019-01-28 MED ORDER — NICOTINE 14 MG/24HR TD PT24
14.0000 mg | MEDICATED_PATCH | Freq: Every day | TRANSDERMAL | Status: DC
  Administered 2019-01-28 – 2019-01-29 (×2): 14 mg via TRANSDERMAL
  Filled 2019-01-28 (×2): qty 1

## 2019-01-28 MED ORDER — STROKE: EARLY STAGES OF RECOVERY BOOK
Freq: Once | Status: AC
  Administered 2019-01-28: 10:00:00
  Filled 2019-01-28 (×3): qty 1

## 2019-01-28 MED ORDER — IOHEXOL 350 MG/ML SOLN
75.0000 mL | Freq: Once | INTRAVENOUS | Status: AC | PRN
  Administered 2019-01-28: 75 mL via INTRAVENOUS

## 2019-01-28 MED ORDER — ACETAMINOPHEN 650 MG RE SUPP: 650.0000 mg | RECTAL | Status: DC | PRN

## 2019-01-28 MED ORDER — CLOPIDOGREL BISULFATE 75 MG PO TABS
75.0000 mg | ORAL_TABLET | Freq: Every day | ORAL | Status: DC
  Administered 2019-01-29: 75 mg via ORAL
  Filled 2019-01-28: qty 1

## 2019-01-28 MED ORDER — ACETAMINOPHEN 325 MG PO TABS: 650.0000 mg | ORAL_TABLET | ORAL | Status: DC | PRN

## 2019-01-28 MED ORDER — ACETAMINOPHEN 160 MG/5ML PO SOLN: 650.0000 mg | ORAL | Status: DC | PRN

## 2019-01-28 MED ORDER — ATORVASTATIN CALCIUM 80 MG PO TABS
80.0000 mg | ORAL_TABLET | Freq: Every day | ORAL | Status: DC
  Administered 2019-01-28: 80 mg via ORAL
  Filled 2019-01-28: qty 1

## 2019-01-28 MED ORDER — ASPIRIN 300 MG RE SUPP: 300.0000 mg | Freq: Every day | RECTAL | Status: DC

## 2019-01-28 MED ORDER — ASPIRIN 325 MG PO TABS
325.0000 mg | ORAL_TABLET | Freq: Every day | ORAL | Status: DC
  Administered 2019-01-28: 325 mg via ORAL
  Filled 2019-01-28: qty 1

## 2019-01-28 NOTE — Progress Notes (Addendum)
Patient is a 53 year old male with history of anxiety, asthma, depression, GERD, hypertension who presents the emergency department complaints of dizziness.  He did not have any focal neurological deficits on presentation. He was mildly hypertensive on presentation.  CT head did not show any acute intracranial abnormalities.MRI brian showed tiny cortical area of restricted diffusion in the posterior right parietal lobe suggesting acute ischemia.   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 Neurology consulted.  Started stroke protocol. Patient seen by PT/OT and no follow-up recommended. Echocardiogram showed left ventricular EF of 66-6 5%, moderate LVH. He has been started on aspirin 325 mg daily.  Started on Lipitor.HbA1C of 6.1. Awaiting further neurological recommendation. Patient seen and examined the bedside this afternoon.  Currently hemodynamically stable.  Denies any complaints.  He does not have any focal neurological deficits. Patient seen by Dr. Marlowe Sax this morning.

## 2019-01-28 NOTE — Progress Notes (Signed)
STROKE TEAM PROGRESS NOTE   INTERVAL HISTORY Patient sitting in bed, currently symptom-free.  He recounted HPI with me.  Patient was at work yesterday, had a sudden onset of dizziness, blurry vision, off balance, lasting 20 minutes and resolved.  Subsequently, he had flashing spots in bilateral visual field lasting 20 seconds.  He went to urgent care, and sent over to ED for further evaluation.  He denies any history of a stroke, migraine or seizure.  Denies any behavior changes, confusion, cognitive impairment.  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  His current smoker, half PPD for the last 30 years.  History of hypertension for the last for 5 years, on BP meds BP controlled reasonable at home.  He had hearing loss on the left side several years ago, follow with [facility] audiology, had left hearing aid.  However, right ear without a problem.  Vitals:   01/28/19 0248 01/28/19 0555 01/28/19 0831 01/28/19 1111  BP: (!) 168/86  (!) 138/93 134/87  Pulse: 66  66 66  Resp: 20  16 18   Temp: 97.9 F (36.6 C)  98 F (36.7 C) 98.1 F (36.7 C)  TempSrc: Oral  Oral Oral  SpO2: 97%  98% 100%  Weight:  93.9 kg    Height:  5\' 10"  (1.778 m)      CBC:  Recent Labs  Lab 01/27/19 1327  WBC 7.4  HGB 15.8  HCT 48.5  MCV 83.0  PLT 701    Basic Metabolic Panel:  Recent Labs  Lab 01/27/19 1327  NA 139  K 4.1  CL 106  CO2 25  GLUCOSE 96  BUN 10  CREATININE 0.88  CALCIUM 9.7   Lipid Panel:     Component Value Date/Time   CHOL 193 01/28/2019 0555   TRIG 144 01/28/2019 0555   HDL 34 (L) 01/28/2019 0555   CHOLHDL 5.7 01/28/2019 0555   VLDL 29 01/28/2019 0555   LDLCALC 130 (H) 01/28/2019 0555   HgbA1c:  Lab Results  Component Value Date   HGBA1C 6.1 (H) 01/28/2019   Urine Drug Screen:     Component Value Date/Time   LABOPIA NONE DETECTED 01/27/2019 2345   COCAINSCRNUR NONE DETECTED 01/27/2019 2345   LABBENZ NONE DETECTED 01/27/2019 2345    AMPHETMU NONE DETECTED 01/27/2019 2345   THCU NONE DETECTED 01/27/2019 2345   LABBARB NONE DETECTED 01/27/2019 2345    Alcohol Level     Component Value Date/Time   ETH <11 01/23/2011 1625    IMAGING Ct Angio Head W Or Wo Contrast  Result Date: 01/28/2019 CLINICAL DATA:  53 y/o  M; Stroke, follow up. EXAM: CT ANGIOGRAPHY HEAD AND NECK TECHNIQUE: Multidetector CT imaging of the head and neck was performed using the standard protocol during bolus administration of intravenous contrast. Multiplanar CT image reconstructions and MIPs were obtained to evaluate the vascular anatomy. Carotid stenosis measurements (when applicable) are obtained utilizing NASCET criteria, using the distal internal carotid diameter as the denominator. CONTRAST:  68mL OMNIPAQUE IOHEXOL 350 MG/ML SOLN COMPARISON:  01/27/2019 MRI of the head and CT of the head. FINDINGS: CTA NECK FINDINGS Aortic arch: Bovine variant branching. Imaged portion shows no evidence of aneurysm or dissection. No significant stenosis of the major arch vessel origins. Fibrofatty plaque of left common carotid artery origin without significant stenosis. Right carotid system: No evidence of dissection, stenosis (50% or greater) or occlusion. Left carotid system: No evidence of dissection, stenosis (  50% or greater) or occlusion. Vertebral arteries: Left dominant. Lower left V2 segment moderate to severe stenosis with fibrofatty plaque (series 5, image 122). No evidence of dissection, stenosis (50% or greater) or occlusion. Skeleton: No acute finding. Mild cervical spondylosis. Other neck: Negative. 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 Review of the MIP images confirms the above findings CTA HEAD FINDINGS Anterior circulation: The proximal right ACA is severely attenuated and the left A1 is occluded. Downstream bilateral ACA distributions are severely attenuated with poor collateralization in the areas of chronic infarction. There are clusters  of small vessels at the bilateral carotid termini likely representing collateralization due to chronic occlusion/high-grade stenosis. Bilateral ICA and MCA distributions are patent without large vessel occlusion, aneurysm, or significant stenosis is identified. Posterior circulation: No significant stenosis, proximal occlusion, aneurysm, or vascular malformation. Venous sinuses: As permitted by contrast timing, patent. Anatomic variants: Small left posterior communicating artery. Possible right posterior communicating artery. Review of the MIP images confirms the above findings IMPRESSION: CTA neck: 1. Left V2 segment moderate to severe stenosis and left common carotid artery origin mild stenosis with fibrofatty plaque. No additional high-grade stenosis, aneurysm, dissection of the carotid or vertebral arteries. 2. Paraseptal emphysema of right lung apex. CTA head: 1. Severe right A1 stenosis, left A1 occlusion, and severely attenuated/irregular downstream bilateral ACA distributions with poor collateralization. Intracranial circulation is otherwise unremarkable. Findings favor chronic sequelae vasculitis/vasculopathy and there is a faint Moyamoya pattern of collateralization at the [place] origins. Electronically Signed   By: Kristine Garbe M.D.   On: 01/28/2019 01:09   Ct Head Wo Contrast  Result Date: 01/27/2019 CLINICAL DATA:  TIA, double vision, dizziness EXAM: CT HEAD WITHOUT CONTRAST TECHNIQUE: Contiguous axial images were obtained from the base of the skull through the vertex without intravenous contrast. COMPARISON:  MR brain, 12/26/2013 FINDINGS: Brain: There is encephalomalacia of the anterior right corona radiata (series 3, image 22). There may be additional hypodensity of the inferior pons or medulla versus streak artifact from the skull base (series 3, image 9). Vascular: No hyperdense vessel or unexpected calcification. Skull: Normal. Negative for fracture or focal lesion. Sinuses/Orbits: No  acute finding. Other: None. IMPRESSION: No definite acute intracranial pathology. There is encephalomalacia of the anterior right corona radiata (series 3, image 22). There may be additional hypodensity of the inferior pons or medulla consistent with age indeterminate lacunar infarction versus streak artifact from the skull base (series 3, image 9). Consider MRI to more sensitively evaluate for acute diffusion restricting infarction if indicated. Electronically Signed   By: Eddie Candle M.D.   On: 01/27/2019 17:12   Ct Angio Neck W And/or Wo Contrast  Result Date: 01/28/2019 CLINICAL DATA:  53 y/o  M; Stroke, follow up. EXAM: CT ANGIOGRAPHY HEAD AND NECK TECHNIQUE: Multidetector CT imaging of the head and neck was performed using the standard protocol during bolus administration of intravenous contrast. Multiplanar CT image reconstructions and MIPs were obtained to evaluate the vascular anatomy. Carotid stenosis measurements (when applicable) are obtained utilizing NASCET criteria, using the distal internal carotid diameter as the denominator. CONTRAST:  34mL OMNIPAQUE IOHEXOL 350 MG/ML SOLN COMPARISON:  01/27/2019 MRI of the head and CT of the head. FINDINGS: CTA NECK FINDINGS Aortic arch: Bovine variant branching. Imaged portion shows no evidence of aneurysm or dissection. No significant stenosis of the major arch vessel origins. Fibrofatty plaque of left common carotid artery origin without significant stenosis. Right carotid system: No evidence of dissection, stenosis (50% or greater)  or occlusion. Left carotid system: No evidence of dissection, stenosis (50% or greater) or occlusion. Vertebral arteries: Left dominant. Lower left V2 segment moderate to severe stenosis with fibrofatty plaque (series 5, image 122). No evidence of dissection, stenosis (50% or greater) or occlusion. Skeleton: No acute finding. Mild cervical spondylosis. Other neck: Negative. Upper chest: Paraseptal emphysema of the right lung  apex. Review of the MIP images confirms the above findings CTA HEAD FINDINGS Anterior circulation: The proximal right ACA is severely attenuated and the left A1 is occluded. Downstream bilateral ACA distributions are severely attenuated with poor collateralization in the areas of chronic infarction. There are clusters of small vessels at the bilateral carotid termini likely representing collateralization due to chronic occlusion/high-grade stenosis. Bilateral ICA and MCA distributions are patent without large vessel occlusion, aneurysm, or significant stenosis is identified. Posterior circulation: No significant stenosis, proximal occlusion, aneurysm, or vascular malformation. Venous sinuses: As permitted by contrast timing, patent. Anatomic variants: Small left posterior communicating artery. Possible right posterior communicating artery. Review of the MIP images confirms the above findings IMPRESSION: CTA neck: 1. Left V2 segment moderate to severe stenosis and left common carotid artery origin mild stenosis with fibrofatty plaque. No additional high-grade stenosis, aneurysm, dissection of the carotid or vertebral arteries. 2. Paraseptal emphysema of right lung apex. CTA head: 1. Severe right A1 stenosis, left A1 occlusion, and severely attenuated/irregular downstream bilateral ACA distributions with poor collateralization. Intracranial circulation is otherwise unremarkable. Findings favor chronic sequelae vasculitis/vasculopathy and there is a faint Moyamoya pattern of collateralization at the [place] origins. Electronically Signed   By: Kristine Garbe M.D.   On: 01/28/2019 01:09   Mr Brain Wo Contrast  Result Date: 01/27/2019 CLINICAL DATA:  53 year old male with episode of dizziness this morning, visual changes. EXAM: MRI HEAD WITHOUT CONTRAST TECHNIQUE: Multiplanar, multiecho pulse sequences of the brain and surrounding structures were obtained without intravenous contrast. COMPARISON:  Head CT 1655  hours today.  Brain MRI 12/26/2013. FINDINGS: Brain: Tiny cortical area of restricted diffusion in the posterior medial right parietal lobe is seen on both axial and coronal diffusion weighted imaging (series 5, image 80). No other restricted diffusion. 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 Associated pericallosal T2 and FLAIR hyperintensity in both hemispheres, new since 2015. Some of these areas have questionable chronic hemosiderin (series 14, image 38). Elsewhere gray and white matter signal is within normal limits. The deep gray matter nuclei, brainstem, and cerebellum appear normal. No midline shift, mass effect, evidence of mass lesion, ventriculomegaly, extra-axial collection or acute intracranial hemorrhage. Cervicomedullary junction and pituitary are within normal limits. Vascular: Major intracranial vascular flow voids are stable since 2015 with dominant appearing left vertebral artery. Skull and upper cervical spine: Negative visible cervical spine. Visualized bone marrow signal is within normal limits. Sinuses/Orbits: Negative orbits.  Paranasal sinuses remain clear. Other: Mastoids remain clear. Visible internal auditory structures appear normal. Scalp and face soft tissues appear negative. IMPRESSION: 1. Tiny cortical area of restricted diffusion in the posterior right parietal lobe might reflect acute ischemia. 2. However, there are unusual bilateral pericallosal signal changes, including encephalomalacia of some of the right cingulate gyrus, which are new since a 2015 MRI which was done for hearing loss. 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 Electronically Signed   By: Genevie Ann M.D.   On: 01/27/2019 23:24   2D  Echocardiogram   1. The left ventricle has normal systolic function with an ejection fraction  of 60-65%. The cavity size was normal. There is moderately increased left ventricular wall thickness. Left ventricular diastolic Doppler parameters are consistent with impaired  relaxation. Elevated left atrial and left ventricular end-diastolic pressures The E/e' is >15. No evidence of left ventricular regional wall motion abnormalities.  2. The right ventricle has normal systolic function. The cavity was normal. There is no increase in right ventricular wall thickness.  3. The mitral valve is grossly normal.  4. The tricuspid valve is grossly normal.  5. The aortic valve was not well visualized. No stenosis of the aortic valve.  6. The interatrial septum was not well visualized.   PHYSICAL EXAM  Temp:  [97.9 F (36.6 C)-98.1 F (36.7 C)] 98.1 F (36.7 C) (06/30 1111) Pulse Rate:  [66] 66 (06/30 1111) Resp:  [16-22] 18 (06/30 1111) BP: (134-168)/(86-93) 134/87 (06/30 1111) SpO2:  [97 %-100 %] 100 % (06/30 1111) Weight:  [93.9 kg] 93.9 kg (06/30 0555)  General - Well nourished, well developed, in no apparent distress.  Ophthalmologic - fundi not visualized due to noncooperation.  Cardiovascular - Regular rate and rhythm.  Mental Status -  Level of arousal and orientation to time, place, and person were intact. Language including expression, naming, repetition, comprehension was assessed and found intact. Attention span and concentration were normal. Recent and remote memory were intact. Fund of Knowledge was assessed and was intact.  Cranial Nerves II - XII - II - Visual field intact OU. III, IV, VI - Extraocular movements intact. V - Facial sensation intact bilaterally. VII - Facial movement intact bilaterally. VIII - Hearing & vestibular intact bilaterally. X - Palate elevates symmetrically. XI - Chin turning & shoulder shrug intact bilaterally. XII - Tongue protrusion intact.  Motor Strength - The patient's strength was normal in all extremities and pronator drift was  absent.  Bulk was normal and fasciculations were absent.   Motor Tone - Muscle tone was assessed at the neck and appendages and was normal.  Reflexes - The patient's reflexes were symmetrical in all extremities and he had no pathological reflexes.  Sensory - Light touch, temperature/pinprick were assessed and were symmetrical.    Coordination - The patient had normal movements in the hands and feet with no ataxia or dysmetria.  Tremor was absent.  Gait and Station - normal gait.   ASSESSMENT/PLAN Mr. Zachary Powers is a 53 y.o. male with history of hypertension, anxiety, depression and vision loss, and remote history of alcohol and substance abuse now clean for at least 8 years presenting with near syncope x 20 mins, colorful spots in his visual fields. HA following symptom resolution.  Stroke:  Tiny R parietal cortical infarcts, etiology unclear, could be due to large vessel disease.  Sussac syndrome is on differential, however patient no encephalopathy, hearing loss only on the left side, no history of vision loss, less likely.  CT head No acute abnormality. Encephalomalacia anter R CC.  MRI  Tiny cortical posterior R parietal lobe infarct. R singulate gyrus chronic infarct, new since 2015.   CTA head & neck L V2 mod to severe stenosis.  Nondominant right VA  2D Echo EF 60-65%. No source of embolus, normal LA size  LE venous Doppler pending  TCD bubble study pending  LDL 130  HgbA1c 6.1  SCDs for VTE prophylaxis  No antiplatelet prior to admission, now on aspirin 325 mg daily and plavix.  Continue DAPT for 3 months and then ASA alone.   Therapy recommendations:  None  Disposition:  Return home  Intracranial vascular stenosis  Bilateral ACA high-grade stenosis versus occlusion  Bilateral ACA territory moyamoya-like presentation  Dominant left VA V2 high-grade stenosis  Likely related to patient current smoking and history of cocaine abuse  On DAPT for 3 months and  then aspirin alone.  Hx of silent stroke  MRI showed right cingulate gyrus encephalomalacia  New since MRI 2015  Likely related to bilateral ACA high-grade stenosis vs. Occlusion  Hypertension  Stable . Continue home meds . Long-term BP goal normotensive  Hyperlipidemia  Home meds:  No statin  Now on lipitor 80  LDL 130, goal < 70  Continue statin at discharge  Tobacco abuse  Current smoker  Smoking cessation counseling provided  Nicotine patch provided  Pt is willing to quit  Other Stroke Risk Factors  Hx ETOH use, none x 8 yrs  Hx cocaine use, none x 8 yrs  Obesity, Body mass index is 29.7 kg/m., recommend weight loss, diet and exercise as appropriate   Family hx stroke (mother)  Other Active Problems  Sister with lupus, currently undergoing hospice level care  Left hearing loss on hearing aid  Hospital day # 0  Rosalin Hawking, MD PhD Stroke Neurology 01/28/2019 5:32 PM    To contact Stroke Continuity provider, please refer to [email or web address] After hours, contact General Neurology

## 2019-01-28 NOTE — ED Notes (Signed)
ED TO INPATIENT HANDOFF REPORT  ED Nurse Name and Phone #: Tray Martinique, [phone]  S Name/Age/Gender Zachary Powers 53 y.o. male Room/Bed: 031C/031C  Code Status   Code Status: Prior  Home/SNF/Other Home Patient oriented to: self, place, time and situation Is this baseline? 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 Patient denies LOC, but states he felt lightheaded and his vision became spotty - denies both now. Denies feeling sick recently, no N/V/D. Also endorses headache that began after episode that has resolved.   Allergies Allergies  Allergen Reactions  . Barbiturates     Recovering addict    Level of Care/Admitting Diagnosis ED Disposition    ED Disposition Condition [facility] Area: [place] [[id number]]  Level of Care: Telemetry Medical [104]  I expect the patient will be discharged within 24 hours: Yes  LOW acuity---Tx typically complete <24 hrs---ACUTE conditions typically can be evaluated <24 hours---LABS likely to return to acceptable levels <24 hours---IS near functional baseline---EXPECTED to return to current living arrangement---NOT newly hypoxic: Meets criteria for 5C-Observation unit  Covid Evaluation: Screening Protocol (No Symptoms)  Diagnosis: Acute CVA (cerebrovascular accident) [facility]) [[id number]]  Admitting Physician: Shela Leff [[id number]]  Attending Physician: Shela Leff [[id number]]  PT Class (Do Not Modify): Observation [104]  PT Acc Code (Do Not Modify): Observation [10022]       B Medical/Surgery History Past Medical History:  Diagnosis Date  . Anxiety   . Asthma   . Back pain   . Depression   . GERD (gastroesophageal reflux disease)   . Hypertension    Past Surgical History:  Procedure  Laterality Date  . HAND SURGERY Left 1991  . KNEE ARTHROSCOPY Left 04/10/2018   Procedure: Left knee arthroscopy, debridement, chondroplasty patella, possible menisectomy;  Surgeon: Susa Day, MD;  Location: [facility] ORS;  Service: Orthopedics;  Laterality: Left;  2 hrs for both procedures  . QUADRICEPS TENDON REPAIR Left 04/10/2018   Procedure: Mini open quad tendon repair;  Surgeon: Susa Day, MD;  Location: [facility];  Service: Orthopedics;  Laterality: Left;     A IV Location/Drains/Wounds Patient Lines/Drains/Airways Status   Active Line/Drains/Airways    Name:   Placement date:   Placement time:   Site:   Days:   Peripheral IV 01/27/19 Right Antecubital   01/27/19    2347    Antecubital   1   Incision (Closed) 04/10/18 Knee Left   04/10/18    1257     293          Intake/Output Last 24 hours No intake or output data in the 24 hours ending 01/28/19 0211  Labs/Imaging Results for orders placed or performed during the hospital encounter of 01/27/19 (from the past 48 hour(s))  Basic metabolic panel     Status: None   Collection Time: 01/27/19  1:27 PM  Result Value Ref Range   Sodium 139 135 - 145 mmol/L   Potassium 4.1 3.5 - 5.1 mmol/L   Chloride 106 98 - 111 mmol/L   CO2 25 22 - 32 mmol/L   Glucose, Bld 96 70 - 99 mg/dL   BUN 10 6 - 20 mg/dL   Creatinine, Ser 0.88 0.61 - 1.24 mg/dL   Calcium 9.7 8.9 - 10.3 mg/dL   GFR calc non  Af Amer >60 >60 mL/min   GFR calc Af Amer >60 >60 mL/min   Anion gap 8 5 - 15    Comment: Performed at [facility] [address], [place], [place] [address]  CBC     Status: Abnormal   Collection Time: 01/27/19  1:27 PM  Result Value Ref Range   WBC 7.4 4.0 - 10.5 K/uL   RBC 5.84 (H) 4.22 - 5.81 MIL/uL   Hemoglobin 15.8 13.0 - 17.0 g/dL   HCT 48.5 39.0 - 52.0 %   MCV 83.0 80.0 - 100.0 fL   MCH 27.1 26.0 - 34.0 pg   MCHC 32.6 30.0 - 36.0 g/dL   RDW 14.2 11.5 - 15.5 %   Platelets 228 150 - 400 K/uL   nRBC 0.0 0.0 - 0.2 %     Comment: Performed at [facility], [place] [address] [place], [place] [address]  Urinalysis, Routine w reflex microscopic     Status: None   Collection Time: 01/27/19  5:20 PM  Result Value Ref Range   Color, Urine YELLOW YELLOW   APPearance CLEAR CLEAR   Specific Gravity, Urine 1.016 1.005 - 1.030   pH 6.0 5.0 - 8.0   Glucose, UA NEGATIVE NEGATIVE mg/dL   Hgb urine dipstick NEGATIVE NEGATIVE   Bilirubin Urine NEGATIVE NEGATIVE   Ketones, ur NEGATIVE NEGATIVE mg/dL   Protein, ur NEGATIVE NEGATIVE mg/dL   Nitrite NEGATIVE NEGATIVE   Leukocytes,Ua NEGATIVE NEGATIVE    Comment: Performed at [facility] [address] [place], [place] [address]  Rapid urine drug screen (hospital performed)     Status: None   Collection Time: 01/27/19 11:45 PM  Result Value Ref Range   Opiates NONE DETECTED NONE DETECTED   Cocaine NONE DETECTED NONE DETECTED   Benzodiazepines NONE DETECTED NONE DETECTED   Amphetamines NONE DETECTED NONE DETECTED   Tetrahydrocannabinol NONE DETECTED NONE DETECTED   Barbiturates NONE DETECTED NONE DETECTED    Comment: (NOTE) DRUG SCREEN FOR MEDICAL PURPOSES ONLY.  IF CONFIRMATION IS NEEDED FOR ANY PURPOSE, NOTIFY LAB WITHIN 5 DAYS. LOWEST DETECTABLE LIMITS FOR URINE DRUG SCREEN Drug Class                     Cutoff (ng/mL) Amphetamine and metabolites    1000 Barbiturate and metabolites    200 Benzodiazepine                 185 Tricyclics and metabolites     300 Opiates and metabolites        300 Cocaine and metabolites        300 THC                            50 Performed at [facility], [place] [address] [place], [place] [address]   SARS Coronavirus 2 (CEPHEID - Performed in [facility] hospital lab), Hosp Order     Status: None   Collection Time: 01/28/19 12:26 AM   Specimen: Nasopharyngeal Swab  Result Value Ref Range   SARS Coronavirus 2 NEGATIVE NEGATIVE    Comment: (NOTE) If result is NEGATIVE SARS-CoV-2 target nucleic acids are  NOT DETECTED. The SARS-CoV-2 RNA is generally detectable in upper and lower  respiratory specimens during the acute phase of infection. The lowest  concentration of SARS-CoV-2 viral copies this assay can detect is 250  copies / mL. A negative result  does not preclude SARS-CoV-2 infection  and should not be used as the sole basis for treatment or other  patient management decisions.  A negative result may occur with  improper specimen collection / handling, submission of specimen other  than nasopharyngeal swab, presence of viral mutation(s) within the  areas targeted by this assay, and inadequate number of viral copies  (<250 copies / mL). A negative result must be combined with clinical  observations, patient history, and epidemiological information. If result is POSITIVE SARS-CoV-2 target nucleic acids are DETECTED. The SARS-CoV-2 RNA is generally detectable in upper and lower  respiratory specimens dur ing the acute phase of infection.  Positive  results are indicative of active infection with SARS-CoV-2.  Clinical  correlation with patient history and other diagnostic information is  necessary to determine patient infection status.  Positive results do  not rule out bacterial infection or co-infection with other viruses. If result is PRESUMPTIVE POSTIVE SARS-CoV-2 nucleic acids MAY BE PRESENT.   A presumptive positive result was obtained on the submitted specimen  and confirmed on repeat testing.  While 2019 novel coronavirus  (SARS-CoV-2) nucleic acids may be present in the submitted sample  additional confirmatory testing may be necessary for epidemiological  and / or clinical management purposes  to differentiate between  SARS-CoV-2 and other Sarbecovirus currently known to infect humans.  If clinically indicated additional testing with an alternate test  methodology [phone]) is advised. The SARS-CoV-2 RNA is generally  detectable in upper and lower respiratory sp ecimens  during the acute  phase of infection. The expected result is Negative. Fact Sheet for Patients:  StrictlyIdeas.no Fact Sheet for Healthcare Providers: BankingDealers.co.za This test is not yet approved or cleared by the [place] FDA and has been authorized for detection and/or diagnosis of SARS-CoV-2 by FDA under an Emergency Use Authorization (EUA).  This EUA will remain in effect (meaning this test can be used) for the duration of the COVID-19 declaration under Section 564(b)(1) of the Act, 21 U.S.C. section 360bbb-3(b)(1), unless the authorization is terminated or revoked sooner. Performed at [facility], [place] [address], [place], [place] [address]    Ct Angio Head W Or Wo Contrast  Result Date: 01/28/2019 CLINICAL DATA:  53 y/o  M; Stroke, follow up. EXAM: CT ANGIOGRAPHY HEAD AND NECK TECHNIQUE: Multidetector CT imaging of the head and neck was performed using the standard protocol during bolus administration of intravenous contrast. Multiplanar CT image reconstructions and MIPs were obtained to evaluate the vascular anatomy. Carotid stenosis measurements (when applicable) are obtained utilizing NASCET criteria, using the distal internal carotid diameter as the denominator. CONTRAST:  32mL OMNIPAQUE IOHEXOL 350 MG/ML SOLN COMPARISON:  01/27/2019 MRI of the head and CT of the head. FINDINGS: CTA NECK FINDINGS Aortic arch: Bovine variant branching. Imaged portion shows no evidence of aneurysm or dissection. No significant stenosis of the major arch vessel origins. Fibrofatty plaque of left common carotid artery origin without significant stenosis. Right carotid system: No evidence of dissection, stenosis (50% or greater) or occlusion. Left carotid system: No evidence of dissection, stenosis (50% or greater) or occlusion. Vertebral arteries: Left dominant. Lower left V2 segment moderate to severe stenosis with fibrofatty plaque (series 5,  image 122). No evidence of dissection, stenosis (50% or greater) or occlusion. Skeleton: No acute finding. Mild cervical spondylosis. Other neck: Negative. 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 Review of the MIP images confirms the above findings CTA HEAD FINDINGS Anterior circulation: The proximal  right ACA is severely attenuated and the left A1 is occluded. Downstream bilateral ACA distributions are severely attenuated with poor collateralization in the areas of chronic infarction. There are clusters of small vessels at the bilateral carotid termini likely representing collateralization due to chronic occlusion/high-grade stenosis. Bilateral ICA and MCA distributions are patent without large vessel occlusion, aneurysm, or significant stenosis is identified. Posterior circulation: No significant stenosis, proximal occlusion, aneurysm, or vascular malformation. Venous sinuses: As permitted by contrast timing, patent. Anatomic variants: Small left posterior communicating artery. Possible right posterior communicating artery. Review of the MIP images confirms the above findings IMPRESSION: CTA neck: 1. Left V2 segment moderate to severe stenosis and left common carotid artery origin mild stenosis with fibrofatty plaque. No additional high-grade stenosis, aneurysm, dissection of the carotid or vertebral arteries. 2. Paraseptal emphysema of right lung apex. CTA head: 1. Severe right A1 stenosis, left A1 occlusion, and severely attenuated/irregular downstream bilateral ACA distributions with poor collateralization. Intracranial circulation is otherwise unremarkable. Findings favor chronic sequelae vasculitis/vasculopathy and there is a faint Moyamoya pattern of collateralization at the Brent origins. Electronically Signed   By: Kristine Garbe M.D.   On: 01/28/2019 01:09   Ct Head Wo Contrast  Result Date: 01/27/2019 CLINICAL DATA:  TIA, double vision, dizziness EXAM: CT HEAD WITHOUT  CONTRAST TECHNIQUE: Contiguous axial images were obtained from the base of the skull through the vertex without intravenous contrast. COMPARISON:  MR brain, 12/26/2013 FINDINGS: Brain: There is encephalomalacia of the anterior right corona radiata (series 3, image 22). There may be additional hypodensity of the inferior pons or medulla versus streak artifact from the skull base (series 3, image 9). Vascular: No hyperdense vessel or unexpected calcification. Skull: Normal. Negative for fracture or focal lesion. Sinuses/Orbits: No acute finding. Other: None. IMPRESSION: No definite acute intracranial pathology. There is encephalomalacia of the anterior right corona radiata (series 3, image 22). There may be additional hypodensity of the inferior pons or medulla consistent with age indeterminate lacunar infarction versus streak artifact from the skull base (series 3, image 9). Consider MRI to more sensitively evaluate for acute diffusion restricting infarction if indicated. Electronically Signed   By: Eddie Candle M.D.   On: 01/27/2019 17:12   Ct Angio Neck W And/or Wo Contrast  Result Date: 01/28/2019 CLINICAL DATA:  53 y/o  M; Stroke, follow up. EXAM: CT ANGIOGRAPHY HEAD AND NECK TECHNIQUE: Multidetector CT imaging of the head and neck was performed using the standard protocol during bolus administration of intravenous contrast. Multiplanar CT image reconstructions and MIPs were obtained to evaluate the vascular anatomy. Carotid stenosis measurements (when applicable) are obtained utilizing NASCET criteria, using the distal internal carotid diameter as the denominator. CONTRAST:  3mL OMNIPAQUE IOHEXOL 350 MG/ML SOLN COMPARISON:  01/27/2019 MRI of the head and CT of the head. FINDINGS: CTA NECK FINDINGS Aortic arch: Bovine variant branching. Imaged portion shows no evidence of aneurysm or dissection. No significant stenosis of the major arch vessel origins. Fibrofatty plaque of left common carotid artery origin  without significant stenosis. Right carotid system: No evidence of dissection, stenosis (50% or greater) or occlusion. Left carotid system: No evidence of dissection, stenosis (50% or greater) or occlusion. Vertebral arteries: Left dominant. Lower left V2 segment moderate to severe stenosis with fibrofatty plaque (series 5, image 122). No evidence of dissection, stenosis (50% or greater) or occlusion. Skeleton: No acute finding. Mild cervical spondylosis. Other neck: Negative. 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 Review of the MIP images confirms  the above findings CTA HEAD FINDINGS Anterior circulation: The proximal right ACA is severely attenuated and the left A1 is occluded. Downstream bilateral ACA distributions are severely attenuated with poor collateralization in the areas of chronic infarction. There are clusters of small vessels at the bilateral carotid termini likely representing collateralization due to chronic occlusion/high-grade stenosis. Bilateral ICA and MCA distributions are patent without large vessel occlusion, aneurysm, or significant stenosis is identified. Posterior circulation: No significant stenosis, proximal occlusion, aneurysm, or vascular malformation. Venous sinuses: As permitted by contrast timing, patent. Anatomic variants: Small left posterior communicating artery. Possible right posterior communicating artery. Review of the MIP images confirms the above findings IMPRESSION: CTA neck: 1. Left V2 segment moderate to severe stenosis and left common carotid artery origin mild stenosis with fibrofatty plaque. No additional high-grade stenosis, aneurysm, dissection of the carotid or vertebral arteries. 2. Paraseptal emphysema of right lung apex. CTA head: 1. Severe right A1 stenosis, left A1 occlusion, and severely attenuated/irregular downstream bilateral ACA distributions with poor collateralization. Intracranial circulation is otherwise unremarkable. Findings favor  chronic sequelae vasculitis/vasculopathy and there is a faint Moyamoya pattern of collateralization at the [place] origins. Electronically Signed   By: Kristine Garbe M.D.   On: 01/28/2019 01:09   Mr Brain Wo Contrast  Result Date: 01/27/2019 CLINICAL DATA:  53 year old male with episode of dizziness this morning, visual changes. EXAM: MRI HEAD WITHOUT CONTRAST TECHNIQUE: Multiplanar, multiecho pulse sequences of the brain and surrounding structures were obtained without intravenous contrast. COMPARISON:  Head CT 1655 hours today.  Brain MRI 12/26/2013. FINDINGS: Brain: Tiny cortical area of restricted diffusion in the posterior medial right parietal lobe is seen on both axial and coronal diffusion weighted imaging (series 5, image 80). No other restricted diffusion. 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 Associated pericallosal T2 and FLAIR hyperintensity in both hemispheres, new since 2015. Some of these areas have questionable chronic hemosiderin (series 14, image 38). Elsewhere gray and white matter signal is within normal limits. The deep gray matter nuclei, brainstem, and cerebellum appear normal. No midline shift, mass effect, evidence of mass lesion, ventriculomegaly, extra-axial collection or acute intracranial hemorrhage. Cervicomedullary junction and pituitary are within normal limits. Vascular: Major intracranial vascular flow voids are stable since 2015 with dominant appearing left vertebral artery. Skull and upper cervical spine: Negative visible cervical spine. Visualized bone marrow signal is within normal limits. Sinuses/Orbits: Negative orbits.  Paranasal sinuses remain clear. Other: Mastoids remain clear. Visible internal auditory structures appear normal. Scalp and face soft tissues appear negative. IMPRESSION: 1. Tiny cortical area of restricted diffusion in the posterior right  parietal lobe might reflect acute ischemia. 2. However, there are unusual bilateral pericallosal signal changes, including encephalomalacia of some of the right cingulate gyrus, which are new since a 2015 MRI which was done for hearing loss. 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 Electronically Signed   By: Genevie Ann M.D.   On: 01/27/2019 23:24    Pending Labs Unresulted Labs (From admission, onward)    Start     Ordered   01/28/19 0011  Antithrombin III  (Hypercoagulable Panel, Comprehensive (PNL))  Once,   STAT     01/28/19 0014   01/28/19 0011  Protein C activity  (Hypercoagulable Panel, Comprehensive (PNL))  Once,   STAT     01/28/19 0014   01/28/19 0011  Protein  C, total  (Hypercoagulable Panel, Comprehensive (PNL))  Once,   STAT     01/28/19 0014   01/28/19 0011  Protein S activity  (Hypercoagulable Panel, Comprehensive (PNL))  Once,   STAT     01/28/19 0014   01/28/19 0011  Protein S, total  (Hypercoagulable Panel, Comprehensive (PNL))  Once,   STAT     01/28/19 0014   01/28/19 0011  Lupus anticoagulant panel  (Hypercoagulable Panel, Comprehensive (PNL))  Once,   STAT     01/28/19 0014   01/28/19 0011  Beta-2-glycoprotein i abs, IgG/M/A  (Hypercoagulable Panel, Comprehensive (PNL))  Once,   STAT     01/28/19 0014   01/28/19 0011  Homocysteine, serum  (Hypercoagulable Panel, Comprehensive (PNL))  Once,   STAT     01/28/19 0014   01/28/19 0011  Factor 5 leiden  (Hypercoagulable Panel, Comprehensive (PNL))  Once,   STAT     01/28/19 0014   01/28/19 0011  Prothrombin gene mutation  (Hypercoagulable Panel, Comprehensive (PNL))  Once,   STAT     01/28/19 0014   01/28/19 0011  Cardiolipin antibodies, IgG, IgM, IgA  (Hypercoagulable Panel, Comprehensive (PNL))  Once,   STAT     01/28/19 0014          Vitals/Pain Today's Vitals   01/27/19 1321 01/27/19 1325 01/27/19 1800  BP: (!) 152/90  (!) 153/91   Pulse: 66    Resp: 16  (!) 22  Temp: 98.4 F (36.9 C)    TempSrc: Oral    SpO2: 98%    PainSc:  0-No pain     Isolation Precautions No active isolations  Medications Medications  sodium chloride flush (NS) 0.9 % injection 3 mL (has no administration in time range)  acetaminophen (TYLENOL) tablet 650 mg (650 mg Oral Given 01/27/19 2226)  iohexol (OMNIPAQUE) 350 MG/ML injection 75 mL (75 mLs Intravenous Contrast Given 01/28/19 0020)    Mobility walks Low fall risk   Focused Assessments Neuro Assessment Handoff:  Swallow screen pass? Yes    NIH Stroke Scale ( + Modified Stroke Scale Criteria)  Interval: Initial Level of Consciousness (1a.)   : Alert, keenly responsive LOC Questions (1b. )   +: Answers both questions correctly LOC Commands (1c. )   + : Performs both tasks correctly Best Gaze (2. )  +: Normal Visual (3. )  +: No visual loss Facial Palsy (4. )    : Normal symmetrical movements Motor Arm, Left (5a. )   +: No drift Motor Arm, Right (5b. )   +: No drift Motor Leg, Left (6a. )   +: No drift Motor Leg, Right (6b. )   +: No drift Limb Ataxia (7. ): Absent Sensory (8. )   +: Normal, no sensory loss Best Language (9. )   +: No aphasia Dysarthria (10. ): Normal Extinction/Inattention (11.)   +: No Abnormality Modified SS Total  +: 0 Complete NIHSS TOTAL: 0     Neuro Assessment:   Neuro Checks:   Initial (01/27/19 1808)  Last Documented NIHSS Modified Score: 0 (01/27/19 1808) Has TPA been given? No If patient is a Neuro Trauma and patient is going to OR before floor call report to [place] nurse: [phone] or [phone]     R Recommendations: See Admitting Provider Note  Report given to:   Additional Notes:

## 2019-01-28 NOTE — H&P (Signed)
History and Physical    Zachary Powers NLZ:[id number] DOB: 10/14/1965 DOA: 01/27/2019  PCP: Nolene Ebbs, MD Patient coming from: Home  Chief Complaint: Dizziness  HPI: Zachary Powers is a 53 y.o. male with medical history significant of anxiety, asthma, depression, GERD, hypertension presenting to the hospital for evaluation of dizziness.  Patient was seen at the urgent care center today with complaints of dizziness and sent to the ED for further evaluation.  Patient states at work today he experienced acute onset dizziness, double vision, and gait instability.  Symptoms lasted 15 minutes.  Since then he is having a slight headache and intermittent flashing of lights in his vision.  Denies any focal weakness or numbness.  Denies history of prior stroke.  States he smokes 1/2 pack of cigarettes daily.  Reports compliance with home blood pressure medications.  States he stopped using illicit drugs and alcohol 8 years ago.  Denies any fevers, chills, chest pain, shortness breath, cough, abdominal pain, nausea, vomiting, or diarrhea.  No other complaints.  ED Course: Blood pressure 152/90, remainder of vitals stable.  No leukocytosis.  UA not suggestive of infection.  UDS pending. Head CT showing no definite acute intracranial pathology.  Evidence of encephalomalacia of the anterior right corona radiata and additional hypodensity of the inferior pons or medulla consistent with age-indeterminate lacunar infarction versus streak artifact from the skull base. Brain MRI showing tiny cortical area of restricted diffusion in the posterior right parietal lobe, might reflect acute ischemia.  Also showing unusual bilateral peri-callosal signal changes, including encephalomalacia of some of the right cingulate gyrus which are new since MRI done in 2015. CTA head and neck pending.  Review of Systems:  All systems reviewed and apart from history of presenting illness, are negative.  Past Medical History:    Diagnosis Date   Anxiety    Asthma    Back pain    Depression    GERD (gastroesophageal reflux disease)    Hypertension     Past Surgical History:  Procedure Laterality Date   HAND SURGERY Left 1991   KNEE ARTHROSCOPY Left 04/10/2018   Procedure: Left knee arthroscopy, debridement, chondroplasty patella, possible menisectomy;  Surgeon: Susa Day, MD;  Location: [facility];  Service: Orthopedics;  Laterality: Left;  2 hrs for both procedures   QUADRICEPS TENDON REPAIR Left 04/10/2018   Procedure: Mini open quad tendon repair;  Surgeon: Susa Day, MD;  Location: [facility];  Service: Orthopedics;  Laterality: Left;     reports that he has been smoking cigarettes. He has been smoking about 0.50 packs per day. He has never used smokeless tobacco. He reports that he does not drink alcohol or use drugs.  Allergies  Allergen Reactions   Barbiturates     Recovering addict    Family History  Problem Relation Age of Onset   Diabetes Father    Heart disease Father    Diabetes Paternal Grandmother    Colon cancer Neg Hx     Prior to Admission medications   Medication Sig Start Date End Date Taking? Authorizing Provider  amoxicillin (AMOXIL) 875 MG tablet Take 1 tablet (875 mg total) by mouth 2 (two) times daily. Patient not taking: Reported on 01/27/2019 07/18/18   Raylene Everts, MD  aspirin EC 325 MG EC tablet Take 1 tablet (325 mg total) by mouth daily with breakfast. 04/12/18   Cecilie Kicks, PA-C  benzonatate (TESSALON) 200 MG capsule Take 1 capsule (200 mg total) by mouth 2 (  two) times daily as needed for cough. Patient not taking: Reported on 01/27/2019 07/18/18   Raylene Everts, MD  docusate sodium (COLACE) 100 MG capsule Take 1 capsule (100 mg total) by mouth 2 (two) times daily as needed. 04/10/18 04/10/19  Cecilie Kicks, PA-C  ipratropium (ATROVENT) 0.03 % nasal spray Place 2 sprays into both nostrils every 12 (twelve) hours. 07/18/18   Raylene Everts, MD  LORazepam (ATIVAN) 0.5 MG tablet Take 0.5 mg by mouth 3 (three) times daily as needed for anxiety. 03/22/18   [provider]  losartan-hydrochlorothiazide (HYZAAR) 100-12.5 MG tablet Take 1 tablet by mouth daily. 02/04/18   [provider]  methocarbamol (ROBAXIN) 500 MG tablet Take 1 tablet (500 mg total) by mouth every 6 (six) hours as needed for muscle spasms. 04/10/18   Cecilie Kicks, PA-C  metoprolol succinate (TOPROL-XL) 25 MG 24 hr tablet Take 1 tablet (25 mg total) by mouth daily. 04/13/18   Cecilie Kicks, PA-C  Multiple Vitamin (MULTIVITAMIN WITH MINERALS) TABS tablet Take 1 tablet by mouth daily. One-A-Day for Men 50+    [provider]  naproxen (NAPROSYN) 500 MG tablet Take 500 mg by mouth daily as needed for mild pain or moderate pain.     [provider]  oxyCODONE-acetaminophen (PERCOCET/ROXICET) 5-325 MG tablet Take 1-2 tablets by mouth every 4 (four) hours as needed for moderate pain or severe pain. Patient not taking: Reported on 01/27/2019 04/10/18   Cecilie Kicks, PA-C  tiZANidine (ZANAFLEX) 4 MG tablet Take 4 mg by mouth 2 (two) times daily.    [provider]  zolpidem (AMBIEN) 10 MG tablet Take 10 mg by mouth at bedtime as needed for sleep.    [provider]    Physical Exam: Vitals:   01/27/19 1321 01/27/19 1800 01/28/19 0248 01/28/19 0555  BP: (!) 152/90 (!) 153/91 (!) 168/86   Pulse: 66  66   Resp: 16 (!) 22 20   Temp: 98.4 F (36.9 C)  97.9 F (36.6 C)   TempSrc: Oral  Oral   SpO2: 98%  97%   Weight:    93.9 kg  Height:    5\' 10"  (1.778 m)    Physical Exam  Constitutional: He is oriented to person, place, and time. He appears well-developed and well-nourished. No distress.  HENT:  Head: Normocephalic.  Mouth/Throat: Oropharynx is clear and moist.  Eyes: Pupils are equal, round, and reactive to light. EOM are normal. Right eye exhibits no discharge. Left eye exhibits no discharge.    Neck: Neck supple.  Cardiovascular: Normal rate, regular rhythm and intact distal pulses.  Pulmonary/Chest: Effort normal and breath sounds normal. No respiratory distress. He has no wheezes. He has no rales.  Abdominal: Soft. Bowel sounds are normal. He exhibits no distension. There is no abdominal tenderness. There is no guarding.  Musculoskeletal:        General: No edema.  Neurological: He is alert and oriented to person, place, and time.  Speech fluent, tongue midline, no facial droop Strength 5 out of 5 in bilateral upper and lower extremities. Sensation to light touch intact throughout.  Skin: Skin is warm and dry. He is not diaphoretic.     Labs on Admission: I have personally reviewed following labs and imaging studies  CBC: Recent Labs  Lab 01/27/19 1327  WBC 7.4  HGB 15.8  HCT 48.5  MCV 83.0  PLT 409   Basic Metabolic Panel: Recent Labs  Lab  01/27/19 1327  NA 139  K 4.1  CL 106  CO2 25  GLUCOSE 96  BUN 10  CREATININE 0.88  CALCIUM 9.7   GFR: Estimated Creatinine Clearance: 111.8 mL/min (by C-G formula based on SCr of 0.88 mg/dL). Liver Function Tests: No results for input(s): AST, ALT, ALKPHOS, BILITOT, PROT, ALBUMIN in the last 168 hours. No results for input(s): LIPASE, AMYLASE in the last 168 hours. No results for input(s): AMMONIA in the last 168 hours. Coagulation Profile: No results for input(s): INR, PROTIME in the last 168 hours. Cardiac Enzymes: No results for input(s): CKTOTAL, CKMB, CKMBINDEX, TROPONINI in the last 168 hours. BNP (last 3 results) No results for input(s): PROBNP in the last 8760 hours. HbA1C: Recent Labs    01/28/19 0555  HGBA1C 6.1*   CBG: No results for input(s): GLUCAP in the last 168 hours. Lipid Profile: Recent Labs    01/28/19 0555  CHOL 193  HDL 34*  LDLCALC 130*  TRIG 144  CHOLHDL 5.7   Thyroid Function Tests: No results for input(s): TSH, T4TOTAL, FREET4, T3FREE, THYROIDAB in the last 72  hours. Anemia Panel: No results for input(s): VITAMINB12, FOLATE, FERRITIN, TIBC, IRON, RETICCTPCT in the last 72 hours. Urine analysis:    Component Value Date/Time   COLORURINE YELLOW 01/27/2019 1720   APPEARANCEUR CLEAR 01/27/2019 1720   LABSPEC 1.016 01/27/2019 1720   PHURINE 6.0 01/27/2019 1720   GLUCOSEU NEGATIVE 01/27/2019 1720   HGBUR NEGATIVE 01/27/2019 1720   BILIRUBINUR NEGATIVE 01/27/2019 1720   KETONESUR NEGATIVE 01/27/2019 1720   PROTEINUR NEGATIVE 01/27/2019 1720   UROBILINOGEN 0.2 01/23/2011 1625   NITRITE NEGATIVE 01/27/2019 1720   LEUKOCYTESUR NEGATIVE 01/27/2019 1720    Radiological Exams on Admission: Ct Angio Head W Or Wo Contrast  Result Date: 01/28/2019 CLINICAL DATA:  53 y/o  M; Stroke, follow up. EXAM: CT ANGIOGRAPHY HEAD AND NECK TECHNIQUE: Multidetector CT imaging of the head and neck was performed using the standard protocol during bolus administration of intravenous contrast. Multiplanar CT image reconstructions and MIPs were obtained to evaluate the vascular anatomy. Carotid stenosis measurements (when applicable) are obtained utilizing NASCET criteria, using the distal internal carotid diameter as the denominator. CONTRAST:  78mL OMNIPAQUE IOHEXOL 350 MG/ML SOLN COMPARISON:  01/27/2019 MRI of the head and CT of the head. FINDINGS: CTA NECK FINDINGS Aortic arch: Bovine variant branching. Imaged portion shows no evidence of aneurysm or dissection. No significant stenosis of the major arch vessel origins. Fibrofatty plaque of left common carotid artery origin without significant stenosis. Right carotid system: No evidence of dissection, stenosis (50% or greater) or occlusion. Left carotid system: No evidence of dissection, stenosis (50% or greater) or occlusion. Vertebral arteries: Left dominant. Lower left V2 segment moderate to severe stenosis with fibrofatty plaque (series 5, image 122). No evidence of dissection, stenosis (50% or greater) or occlusion. Skeleton:  No acute finding. Mild cervical spondylosis. Other neck: Negative. 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 Review of the MIP images confirms the above findings CTA HEAD FINDINGS Anterior circulation: The proximal right ACA is severely attenuated and the left A1 is occluded. Downstream bilateral ACA distributions are severely attenuated with poor collateralization in the areas of chronic infarction. There are clusters of small vessels at the bilateral carotid termini likely representing collateralization due to chronic occlusion/high-grade stenosis. Bilateral ICA and MCA distributions are patent without large vessel occlusion, aneurysm, or significant stenosis is identified. Posterior circulation: No significant stenosis, proximal occlusion, aneurysm, or vascular malformation. Venous  sinuses: As permitted by contrast timing, patent. Anatomic variants: Small left posterior communicating artery. Possible right posterior communicating artery. Review of the MIP images confirms the above findings IMPRESSION: CTA neck: 1. Left V2 segment moderate to severe stenosis and left common carotid artery origin mild stenosis with fibrofatty plaque. No additional high-grade stenosis, aneurysm, dissection of the carotid or vertebral arteries. 2. Paraseptal emphysema of right lung apex. CTA head: 1. Severe right A1 stenosis, left A1 occlusion, and severely attenuated/irregular downstream bilateral ACA distributions with poor collateralization. Intracranial circulation is otherwise unremarkable. Findings favor chronic sequelae vasculitis/vasculopathy and there is a faint Moy[place]oya pattern of collateralization at the [place] origins. Electronically Signed   By: Kristine Garbe M.D.   On: 01/28/2019 01:09   Ct Head Wo Contrast  Result Date: 01/27/2019 CLINICAL DATA:  TIA, double vision, dizziness EXAM: CT HEAD WITHOUT CONTRAST TECHNIQUE: Contiguous axial images were obtained from the base of the skull through  the vertex without intravenous contrast. COMPARISON:  MR brain, 12/26/2013 FINDINGS: Brain: There is encephalomalacia of the anterior right corona radiata (series 3, image 22). There may be additional hypodensity of the inferior pons or medulla versus streak artifact from the skull base (series 3, image 9). Vascular: No hyperdense vessel or unexpected calcification. Skull: Normal. Negative for fracture or focal lesion. Sinuses/Orbits: No acute finding. Other: None. IMPRESSION: No definite acute intracranial pathology. There is encephalomalacia of the anterior right corona radiata (series 3, image 22). There may be additional hypodensity of the inferior pons or medulla consistent with age indeterminate lacunar infarction versus streak artifact from the skull base (series 3, image 9). Consider MRI to more sensitively evaluate for acute diffusion restricting infarction if indicated. Electronically Signed   By: Eddie Candle M.D.   On: 01/27/2019 17:12   Ct Angio Neck W And/or Wo Contrast  Result Date: 01/28/2019 CLINICAL DATA:  53 y/o  M; Stroke, follow up. EXAM: CT ANGIOGRAPHY HEAD AND NECK TECHNIQUE: Multidetector CT imaging of the head and neck was performed using the standard protocol during bolus administration of intravenous contrast. Multiplanar CT image reconstructions and MIPs were obtained to evaluate the vascular anatomy. Carotid stenosis measurements (when applicable) are obtained utilizing NASCET criteria, using the distal internal carotid diameter as the denominator. CONTRAST:  44mL OMNIPAQUE IOHEXOL 350 MG/ML SOLN COMPARISON:  01/27/2019 MRI of the head and CT of the head. FINDINGS: CTA NECK FINDINGS Aortic arch: Bovine variant branching. Imaged portion shows no evidence of aneurysm or dissection. No significant stenosis of the major arch vessel origins. Fibrofatty plaque of left common carotid artery origin without significant stenosis. Right carotid system: No evidence of dissection, stenosis (50%  or greater) or occlusion. Left carotid system: No evidence of dissection, stenosis (50% or greater) or occlusion. Vertebral arteries: Left dominant. Lower left V2 segment moderate to severe stenosis with fibrofatty plaque (series 5, image 122). No evidence of dissection, stenosis (50% or greater) or occlusion. Skeleton: No acute finding. Mild cervical spondylosis. Other neck: Negative. 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 Review of the MIP images confirms the above findings CTA HEAD FINDINGS Anterior circulation: The proximal right ACA is severely attenuated and the left A1 is occluded. Downstream bilateral ACA distributions are severely attenuated with poor collateralization in the areas of chronic infarction. There are clusters of small vessels at the bilateral carotid termini likely representing collateralization due to chronic occlusion/high-grade stenosis. Bilateral ICA and MCA distributions are patent without large vessel occlusion, aneurysm, or significant stenosis is identified. Posterior circulation:  No significant stenosis, proximal occlusion, aneurysm, or vascular malformation. Venous sinuses: As permitted by contrast timing, patent. Anatomic variants: Small left posterior communicating artery. Possible right posterior communicating artery. Review of the MIP images confirms the above findings IMPRESSION: CTA neck: 1. Left V2 segment moderate to severe stenosis and left common carotid artery origin mild stenosis with fibrofatty plaque. No additional high-grade stenosis, aneurysm, dissection of the carotid or vertebral arteries. 2. Paraseptal emphysema of right lung apex. CTA head: 1. Severe right A1 stenosis, left A1 occlusion, and severely attenuated/irregular downstream bilateral ACA distributions with poor collateralization. Intracranial circulation is otherwise unremarkable. Findings favor chronic sequelae vasculitis/vasculopathy and there is a faint Moyamoya pattern of  collateralization at the [place] origins. Electronically Signed   By: Kristine Garbe M.D.   On: 01/28/2019 01:09   Mr Brain Wo Contrast  Result Date: 01/27/2019 CLINICAL DATA:  53 year old male with episode of dizziness this morning, visual changes. EXAM: MRI HEAD WITHOUT CONTRAST TECHNIQUE: Multiplanar, multiecho pulse sequences of the brain and surrounding structures were obtained without intravenous contrast. COMPARISON:  Head CT 1655 hours today.  Brain MRI 12/26/2013. FINDINGS: Brain: Tiny cortical area of restricted diffusion in the posterior medial right parietal lobe is seen on both axial and coronal diffusion weighted imaging (series 5, image 80). No other restricted diffusion. 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 Associated pericallosal T2 and FLAIR hyperintensity in both hemispheres, new since 2015. Some of these areas have questionable chronic hemosiderin (series 14, image 38). Elsewhere gray and white matter signal is within normal limits. The deep gray matter nuclei, brainstem, and cerebellum appear normal. No midline shift, mass effect, evidence of mass lesion, ventriculomegaly, extra-axial collection or acute intracranial hemorrhage. Cervicomedullary junction and pituitary are within normal limits. Vascular: Major intracranial vascular flow voids are stable since 2015 with dominant appearing left vertebral artery. Skull and upper cervical spine: Negative visible cervical spine. Visualized bone marrow signal is within normal limits. Sinuses/Orbits: Negative orbits.  Paranasal sinuses remain clear. Other: Mastoids remain clear. Visible internal auditory structures appear normal. Scalp and face soft tissues appear negative. IMPRESSION: 1. Tiny cortical area of restricted diffusion in the posterior right parietal lobe might reflect acute ischemia. 2. However, there are unusual  bilateral pericallosal signal changes, including encephalomalacia of some of the right cingulate gyrus, which are new since a 2015 MRI which was done for hearing loss. 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 Electronically Signed   By: Genevie Ann M.D.   On: 01/27/2019 23:24    EKG: Independently reviewed.  Normal sinus rhythm.  No acute ischemic changes.  Assessment/Plan Principal Problem:   Acute CVA (cerebrovascular accident) ([place]) Active Problems:   Tobacco use   HTN (hypertension)   Acute ischemic stroke Risk factors include tobacco use and hypertension.  Presenting with complaints of dizziness, diplopia, and gait instability.  Symptoms lasted 15 minutes. Brain MRI showing tiny cortical area of restricted diffusion in the posterior right parietal lobe, might reflect acute ischemia.  Also showing unusual bilateral peri-callosal signal changes, including encephalomalacia of some of the right cingulate gyrus which are new since MRI done in 2015.  CT angiogram head showing severe right A1 stenosis, left A1 occlusion, and severely attenuated/irregular downstream bilateral ACA distributions with poor collateralization.  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  CTA neck showing left V2 segment moderate to  severe stenosis and left common carotid artery origin mild stenosis. -Neurology following.  Hypercoagulable panel ordered. -Telemetry monitoring -Allow for permissive hypertension up to 220/120 -2D echocardiogram -Hemoglobin A1c, fasting lipid panel -Aspirin 325 p.o. now and daily -Atorvastatin 80 mg daily -Frequent neurochecks -PT, OT, speech therapy. -N.p.o. until cleared by bedside swallow evaluation or formal speech evaluation -Counseled on smoking cessation  Tobacco use -Counseled to quit -NicoDerm patch  Hypertension -Allow permissive hypertension given acute ischemic  stroke  DVT prophylaxis: Lovenox Code Status: Full code Family Communication: No family available. Disposition Plan: Anticipate discharge after clinical improvement. Consults called: Neurology Admission status: It is my clinical opinion that referral for OBSERVATION is reasonable and necessary in this patient based on the above information provided. The aforementioned taken together are felt to place the patient at high risk for further clinical deterioration. However it is anticipated that the patient may be medically stable for discharge from the hospital within 24 to 48 hours.  The medical decision making on this patient was of high complexity and the patient is at high risk for clinical deterioration, therefore this is a level 3 visit.  Shela Leff MD [facility] Pager [phone]  If 7PM-7AM, please contact night-coverage [email or web address] Password [email or web address]  01/28/2019, 8:18 AM

## 2019-01-28 NOTE — Progress Notes (Signed)
Bilateral lower extremity venous duplex completed. Refer to "CV Proc" under chart review to view preliminary results.  01/28/2019 5:30 PM Maudry Mayhew, MHA, RVT, RDCS, RDMS

## 2019-01-28 NOTE — Evaluation (Signed)
Occupational Therapy Evaluation Patient Details Name: Zachary Powers MRN: [id number] DOB: September 21, 1965 Today's Date: 01/28/2019    History of Present Illness 53 y.o. male presenting with near syncopal episode while at work with symptoms lasting 10-15 minutes. PMH including anxiety, asthma, left TKA (2019), depression, GERD, and HTN. MRI showing small restricted diffusion in the posterior right parietal lobe. 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    Clinical Impression   PTA, pt was living with his two children and was independent and working as a Training and development officer for [employer]. Pt currently presenting at baseline function demonstrating [facility] vision, cognition, balance, strength, and activity tolerance. Pt reporting he feels at baseline. At end of session, pt reporting dizziness and blurry vision when sitting down that lasted ~30 seconds. BP 141/92. Provided education on BEFAST for stroke signs and symptoms. Recommend dc home once medically stable and answered all pt questions. All acute OT needs met. Please re-consult if functional status changes. Thank you.    Follow Up Recommendations  No OT follow up    Equipment Recommendations  None recommended by OT    Recommendations for Other Services       Precautions / Restrictions        Mobility Bed Mobility               General bed mobility comments: In recliner upon arrival  Transfers Overall transfer level: Independent                    Balance Overall balance assessment: Independent                                         ADL either performed or assessed with clinical judgement   ADL Overall ADL's : Independent                                       General ADL Comments: Pt performing at baseline function. Testing cognition, balance, strength, and activity tolerance.      Vision Baseline Vision/History: No visual deficits Patient Visual Report:  No change from baseline;Other (comment)(Blurry vision and diplopia during episode)       Perception     Praxis      Pertinent Vitals/Pain Pain Assessment: No/denies pain     Hand Dominance Right   Extremity/Trunk Assessment Upper Extremity Assessment Upper Extremity Assessment: Overall WFL for tasks assessed   Lower Extremity Assessment Lower Extremity Assessment: Overall WFL for tasks assessed   Cervical / Trunk Assessment Cervical / Trunk Assessment: Normal   Communication Communication Communication: No difficulties   Cognition Arousal/Alertness: Awake/alert Behavior During Therapy: WFL for tasks assessed/performed Overall Cognitive Status: Within Functional Limits for tasks assessed                                 General Comments: Maintaining conversation while performing several tasks. following commands. Recalling information. WFL   General Comments  BP 141/92 at end of session. Pt reporting slight dizziness and blurry vision in left eye when sitting down that lasted ~30 seconds. Providing pt with education on BEFAST for stroke signs and symptoms    Exercises     Shoulder Instructions  Home Living Family/patient expects to be discharged to:: Private residence Living Arrangements: Children(Son and daughter) Available Help at Discharge: Family;Available PRN/intermittently Type of Home: House Home Access: Stairs to enter [employer] of Steps: 3   Home Layout: One level     Bathroom Shower/Tub: Teacher, early years/pre: Standard     Home Equipment: Crutches          Prior Functioning/Environment Level of Independence: Independent        Comments: ADLs, IADLs, works as a Training and development officer at [employer] in [place]. Single father to two kids (ages 41 and 24)        OT Problem List: Impaired vision/perception;Impaired balance (sitting and/or standing);Decreased knowledge of precautions      OT Treatment/Interventions:       OT Goals(Current goals can be found in the care plan section) Acute Rehab OT Goals Patient Stated Goal: "See the doctor to talk about the plan." OT Goal Formulation: All assessment and education complete, DC therapy  OT Frequency:     Barriers to D/C:            Co-evaluation              AM-PAC OT "6 Clicks" Daily Activity     Outcome Measure Help from another person eating meals?: None Help from another person taking care of personal grooming?: None Help from another person toileting, which includes using toliet, bedpan, or urinal?: None Help from another person bathing (including washing, rinsing, drying)?: None Help from another person to put on and taking off regular upper body clothing?: None Help from another person to put on and taking off regular lower body clothing?: None 6 Click Score: 24   End of Session Nurse Communication: Mobility status  Activity Tolerance: Patient tolerated treatment well Patient left: in chair;with call bell/phone within reach  OT Visit Diagnosis: Unsteadiness on feet (R26.81);Muscle weakness (generalized) (M62.81);Low vision, both eyes (H54.2)                Time: 0034-9611 OT Time Calculation (min): 17 min Charges:  OT General Charges $OT Visit: 1 Visit OT Evaluation $OT Eval Low Complexity: [place], OTR/L Acute Rehab Pager: [phone] Office: [place] 01/28/2019, 10:54 AM

## 2019-01-28 NOTE — Consult Note (Signed)
Neurology Consultation  Reason for Consult: Near syncope, dizziness, abnormal MRI Referring Physician: Dr. Ronnald Ramp  CC: Dizziness, spotty vision  History is obtained from: Patient, chart review HPI: Zachary Powers is a 53 y.o. 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 He reports that he was at work this morning and an episode of what he describes as dizziness-not lightheadedness but off balance as he started to walk around.  This episode lasted about 20 minutes.  During that time he had difficulty with his vision that he describes as seeing colorful spots on all visual fields.  He reports seeing his doctor regularly with his last physical in February this year. He continues to smoke but has not used cocaine or alcohol for 8 years. He has a sister and a mother with a history of lupus.  His sister is in her mid 75s and on hospice care because of complications from lupus. His mother has a history of stroke.  No history of heart disease in the family.  Currently denies tingling numbness weakness.  Denies chest pain shortness of breath.  Denies abdominal pain nausea vomiting.  LKW: 10 AM on 01/27/2019 tpa given?: no, outside the window Premorbid modified Rankin scale (mRS): 0  ROS: ROS was performed and is negative except as noted in the HPI.    Past Medical History:  Diagnosis Date  . Anxiety   . Asthma   . Back pain   . Depression   . GERD (gastroesophageal reflux disease)   . Hypertension     Family History  Problem Relation Age of Onset  . Diabetes Father   . Heart disease Father   . Diabetes Paternal Grandmother   . Colon cancer Neg Hx   Sister and mother with history of systemic lupus erythematosus  Social History:   reports  that he has been smoking cigarettes. He has been smoking about 0.50 packs per day. He has never used smokeless tobacco. He reports that he does not drink alcohol or use drugs.  Current smoker, remote cocaine abuse, remote alcohol abuse.  Medications  Current Facility-Administered Medications:  .  sodium chloride flush (NS) 0.9 % injection 3 mL, 3 mL, Intravenous, Once, Hayden Rasmussen, MD  Current Outpatient Medications:  .  amoxicillin (AMOXIL) 875 MG tablet, Take 1 tablet (875 mg total) by mouth 2 (two) times daily. (Patient not taking: Reported on 01/27/2019), Disp: 14 tablet, Rfl: 0 .  aspirin EC 325 MG EC tablet, Take 1 tablet (325 mg total) by mouth daily with breakfast., Disp: 30 tablet, Rfl: 0 .  benzonatate (TESSALON) 200 MG capsule, Take 1 capsule (200 mg total) by mouth 2 (two) times daily as needed for cough. (Patient not taking: Reported on 01/27/2019), Disp: 20 capsule, Rfl: 0 .  docusate sodium (COLACE) 100 MG capsule, Take 1 capsule (100 mg total) by mouth 2 (two) times daily as needed., Disp: 40 capsule, Rfl: 1 .  ipratropium (ATROVENT) 0.03 % nasal spray, Place 2 sprays into both nostrils every 12 (twelve) hours., Disp: 30 mL, Rfl: 0 .  LORazepam (ATIVAN) 0.5 MG tablet, Take 0.5 mg by mouth 3 (three) times daily as needed for anxiety., Disp: , Rfl: 0 .  losartan-hydrochlorothiazide (HYZAAR) 100-12.5 MG tablet, Take 1  tablet by mouth daily., Disp: , Rfl: 5 .  methocarbamol (ROBAXIN) 500 MG tablet, Take 1 tablet (500 mg total) by mouth every 6 (six) hours as needed for muscle spasms., Disp: 40 tablet, Rfl: 1 .  metoprolol succinate (TOPROL-XL) 25 MG 24 hr tablet, Take 1 tablet (25 mg total) by mouth daily., Disp: 30 tablet, Rfl: 0 .  Multiple Vitamin (MULTIVITAMIN WITH MINERALS) TABS tablet, Take 1 tablet by mouth daily. One-A-Day for Men 50+, Disp: , Rfl:  .  naproxen (NAPROSYN) 500 MG tablet, Take 500 mg by mouth daily as needed for mild pain or moderate pain. , Disp: , Rfl:  .   oxyCODONE-acetaminophen (PERCOCET/ROXICET) 5-325 MG tablet, Take 1-2 tablets by mouth every 4 (four) hours as needed for moderate pain or severe pain. (Patient not taking: Reported on 01/27/2019), Disp: 40 tablet, Rfl: 0 .  tiZANidine (ZANAFLEX) 4 MG tablet, Take 4 mg by mouth 2 (two) times daily., Disp: , Rfl:  .  zolpidem (AMBIEN) 10 MG tablet, Take 10 mg by mouth at bedtime as needed for sleep., Disp: , Rfl:   Exam: Current vital signs: BP (!) 153/91   Pulse 66   Temp 98.4 F (36.9 C) (Oral)   Resp (!) 22   SpO2 98%  Vital signs in last 24 hours: Temp:  [98.2 F (36.8 C)-98.4 F (36.9 C)] 98.4 F (36.9 C) (06/29 1321) Pulse Rate:  [66-79] 66 (06/29 1321) Resp:  [16-22] 22 (06/29 1800) BP: (152-154)/(90-91) 153/91 (06/29 1800) SpO2:  [96 %-98 %] 98 % (06/29 1321)  GENERAL: Awake, alert in NAD HEENT: - Normocephalic and atraumatic, dry mm, no LN++, no Thyromegally LUNGS - Clear to auscultation bilaterally with no wheezes CV - S1S2 RRR, no m/r/g, equal pulses bilaterally. ABDOMEN - Soft, nontender, nondistended with normoactive BS Ext: warm, well perfused, intact peripheral pulses, no edema  NEURO:  Mental Status: AA&Ox3  Language: speech is non-dysarthric.  Naming, repetition, fluency, and comprehension intact. Cranial Nerves: PERRLEOMI, visual fields full, no facial asymmetry, facial sensation intact, hearing intact, tongue/uvula/soft palate midline, normal sternocleidomastoid and trapezius muscle strength. No evidence of tongue atrophy or fibrillations Motor: 5/5 with no drift Tone: is normal and bulk is normal Sensation- Intact to light touch bilaterally Coordination: FTN intact bilaterally Gait- deferred deferred at this time  NIHSS - 0   Labs I have reviewed labs in epic and the results pertinent to this consultation are:  CBC    Component Value Date/Time   WBC 7.4 01/27/2019 1327   RBC 5.84 (H) 01/27/2019 1327   HGB 15.8 01/27/2019 1327   HCT 48.5 01/27/2019  1327   PLT 228 01/27/2019 1327   MCV 83.0 01/27/2019 1327   MCH 27.1 01/27/2019 1327   MCHC 32.6 01/27/2019 1327   RDW 14.2 01/27/2019 1327   LYMPHSABS 2.8 04/11/2018 1146   MONOABS 3.0 (H) 04/11/2018 1146   EOSABS 0.0 04/11/2018 1146   BASOSABS 0.0 04/11/2018 1146    CMP     Component Value Date/Time   NA 139 01/27/2019 1327   K 4.1 01/27/2019 1327   CL 106 01/27/2019 1327   CO2 25 01/27/2019 1327   GLUCOSE 96 01/27/2019 1327   BUN 10 01/27/2019 1327   CREATININE 0.88 01/27/2019 1327   CALCIUM 9.7 01/27/2019 1327   PROT 7.8 04/11/2018 1146   ALBUMIN 4.4 04/11/2018 1146   AST 17 04/11/2018 1146   ALT 18 04/11/2018 1146   ALKPHOS 53 04/11/2018 1146   BILITOT 0.5 04/11/2018 1146  GFRNONAA >60 01/27/2019 1327   GFRAA >60 01/27/2019 1327    Imaging I have reviewed the images obtained:  MRI examination of the brain shows very small area of restricted diffusion in the posterior right parietal-occipital area.  Assessment: Mr. Litton is a 53 year old man with past medical history of hypertension, remote history of alcohol and cocaine abuse which she has been clean now for 8 years, presenting for evaluation of an episode of dizziness that he describes of feeling off balance along with some visual symptoms of seeing spots in all his visual fields.  All the symptoms lasted for about 30 minutes and self resolved.  But they were concerning enough for him to come to an urgent care from where he was referred to the emergency room. An MRI in the emergency room shows a very small punctate looking area of restricted diffusion in the posterior right parieto-occipital area. He has a strong family history of lupus with his mother and sister both being affected by it. His sister, who is in her mid 59s, has had multiple complications as result of her lupus and is in hospice care. I suspect that his current presentation with the stroke is likely small vessel etiology but he is young and at this  time I think given the family history of lupus-I would pursue the risk factor work-up for stroke in young.   Impression: Acute ischemic stroke-etiology undetermined  Recommendations: -Admit to hospitalist -Maintain on telemetry -Frequent neurochecks -CTA head and neck -I have taken the liberty to order the hypercoagulable panel to include Antithrombin III, protein C, protein S, lupus anticoagulant, beta-2 glycoprotein antibodies, homocystine, factor V Leiden, prothrombin gene and cardiolipin antibodies. -A1c -Lipid panel -Physical therapy -Speech therapy -Occupational Therapy -N.p.o. until cleared by bedside swallow or formal swallow evaluation. -I discussed the importance of quitting smoking and continue to refrain from using illicit drugs as they are the risk factors for strokes in young.  I commended him on having refrain from abusing alcohol or illicit drugs for the past 8 years. He verbalized understanding of the importance of quitting smoking to decrease his chances of having strokes in the future. -Allow for permissive hypertension for now-for 24 to 48 hours- and prior to discharge start normalizing blood pressure with a goal blood pressure on discharge being 140/90 or below. -I also discussed the importance of maintaining a healthy lifestyle and blood pressure management be important for stroke risk reduction as well.  Stroke team will continue to follow with you.  -- Amie Portland, MD [facility] Pager: [phone] If 7pm to 7am, please call on call as listed on AMION.

## 2019-01-28 NOTE — Progress Notes (Signed)
Patient transferred from [facility] to 3W23, A&Ox4. POC given to patient. Denies pain. Call bell within reach. Nurse will continue to monitor.

## 2019-01-28 NOTE — Progress Notes (Signed)
SLP Cancellation Note  Patient Details Name: Zachary Powers MRN: [id number] DOB: [birth year]   Cancelled treatment:       Reason Eval/Treat Not Completed: SLP screened, no needs identified, will sign off. Pt is a 53 y.o. male with medical history significant of anxiety, asthma, depression, GERD, hypertension who presented to the hospital for evaluation of dizziness. MRI of the brain showed tiny cortical area of restricted diffusion in the posterior right parietal lobe might reflect acute ischemia. 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 Pt denied any baseline or new deficits in speech, language or cognition. He was evaluated by OT and no deficits were noted in those areas. SLP will sign off at this time since skilled SLP services are not clinically indicated.   Estephani Popper I. Hardin Negus, [place], [place] Office number [phone] Pager [place] 01/28/2019, 4:58 PM

## 2019-01-28 NOTE — Progress Notes (Signed)
PT Cancellation Note  Patient Details Name: Zachary Powers MRN: [id number] DOB: [birth year]   Cancelled Treatment:    Reason Eval/Treat Not Completed: PT screened, no needs identified, will sign off Per OT, pt is at baseline and has no skilled acute PT needs. Will sign off. If needs change, please re-consult.   [name], PT, DPT  Acute Rehabilitation Services  Pager: [phone] Office: [phone]  [name] 01/28/2019, 10:34 AM

## 2019-01-28 NOTE — Progress Notes (Signed)
  Echocardiogram 2D Echocardiogram has been performed.  Jannett Celestine 01/28/2019, 10:59 AM

## 2019-01-29 ENCOUNTER — Inpatient Hospital Stay (HOSPITAL_COMMUNITY): Payer: Medicaid Other

## 2019-01-29 DIAGNOSIS — I10 Essential (primary) hypertension: Secondary | ICD-10-CM

## 2019-01-29 DIAGNOSIS — I6329 Cerebral infarction due to unspecified occlusion or stenosis of other precerebral arteries: Secondary | ICD-10-CM

## 2019-01-29 LAB — RHEUMATOID FACTOR: Rheumatoid fact SerPl-aCnc: <10 IU/mL (ref 0.0–13.9)

## 2019-01-29 LAB — LUPUS ANTICOAGULANT PANEL
DRVVT: 29 s (ref 0.0–47.0)
PTT Lupus Anticoagulant: 37.3 s (ref 0.0–51.9)

## 2019-01-29 LAB — ANCA TITERS
Atypical P-ANCA titer: <1:20 {titer}
C-ANCA: <1:20 {titer}
P-ANCA: <1:20 {titer}

## 2019-01-29 LAB — PROTEIN C ACTIVITY: Protein C Activity: 100 % (ref 73–180)

## 2019-01-29 LAB — PROTEIN C, TOTAL: Protein C, Total: 85 % (ref 60–150)

## 2019-01-29 LAB — HOMOCYSTEINE: Homocysteine: 9.6 umol/L (ref 0.0–14.5)

## 2019-01-29 LAB — PROTEIN S, TOTAL: Protein S Ag, Total: 69 % (ref 60–150)

## 2019-01-29 LAB — ANTI-DNA ANTIBODY, DOUBLE-STRANDED: ds DNA Ab: 2 IU/mL (ref 0–9)

## 2019-01-29 LAB — MPO/PR-3 (ANCA) ANTIBODIES
ANCA Proteinase 3: <3.5 U/mL (ref 0.0–3.5)
Myeloperoxidase Abs: <9 U/mL (ref 0.0–9.0)

## 2019-01-29 LAB — PROTEIN S ACTIVITY: Protein S Activity: 49 % — ABNORMAL LOW (ref 63–140)

## 2019-01-29 LAB — ANTINUCLEAR ANTIBODIES, IFA: ANA Ab, IFA: NEGATIVE

## 2019-01-29 LAB — SJOGRENS SYNDROME-A EXTRACTABLE NUCLEAR ANTIBODY: SSA (Ro) (ENA) Antibody, IgG: <0.2 AI (ref 0.0–0.9)

## 2019-01-29 LAB — ANTI-SMITH ANTIBODY: ENA SM Ab Ser-aCnc: <0.2 AI (ref 0.0–0.9)

## 2019-01-29 LAB — SJOGRENS SYNDROME-B EXTRACTABLE NUCLEAR ANTIBODY: SSB (La) (ENA) Antibody, IgG: <0.2 AI (ref 0.0–0.9)

## 2019-01-29 MED ORDER — CLOPIDOGREL BISULFATE 75 MG PO TABS: 75.0000 mg | ORAL_TABLET | Freq: Every day | ORAL | 0 refills | Status: DC

## 2019-01-29 MED ORDER — NICOTINE 14 MG/24HR TD PT24: 14.0000 mg | MEDICATED_PATCH | Freq: Every day | TRANSDERMAL | 0 refills | Status: DC

## 2019-01-29 MED ORDER — ATORVASTATIN CALCIUM 80 MG PO TABS: 80.0000 mg | ORAL_TABLET | Freq: Every day | ORAL | 1 refills | Status: AC

## 2019-01-29 MED ORDER — CLOPIDOGREL BISULFATE 75 MG PO TABS: 75.0000 mg | ORAL_TABLET | Freq: Every day | ORAL | 3 refills | Status: AC

## 2019-01-29 MED FILL — ATORVASTATIN CALCIUM 80 MG: 80 | 30 days supply | Qty: 30 | Fill #0

## 2019-01-29 MED FILL — CLOPIDOGREL 75 MG TABLET: 75 | 30 days supply | Qty: 30 | Fill #0

## 2019-01-29 MED FILL — NICOTINE 14 MG/24HR PATCH: 14 | 28 days supply | Qty: 28 | Fill #0

## 2019-01-29 NOTE — Progress Notes (Signed)
STROKE TEAM PROGRESS NOTE   INTERVAL HISTORY Pt lying in bed.  Had TCD bubble study showed no HITs at rest, however Spencer degree 3 on Valsalva maneuver.  He works as a Training and development officer, frequently lifting heavy Recruitment consultant.  Will refer to Dr. Burt Knack for further evaluation.  Vitals:   01/29/19 0046 01/29/19 0350 01/29/19 0804 01/29/19 1245  BP: (!) 162/96 (!) 158/89 (!) 150/81 (!) 169/99  Pulse: 65 74 71 60  Resp: 18 20 18    Temp: 98.2 F (36.8 C) 97.8 F (36.6 C) 98 F (36.7 C) 97.9 F (36.6 C)  TempSrc: Oral   Oral  SpO2: 92% 97% 99% 100%  Weight:      Height:        CBC:  Recent Labs  Lab 01/27/19 1327  WBC 7.4  HGB 15.8  HCT 48.5  MCV 83.0  PLT 409    Basic Metabolic Panel:  Recent Labs  Lab 01/27/19 1327  NA 139  K 4.1  CL 106  CO2 25  GLUCOSE 96  BUN 10  CREATININE 0.88  CALCIUM 9.7   Lipid Panel:     Component Value Date/Time   CHOL 193 01/28/2019 0555   TRIG 144 01/28/2019 0555   HDL 34 (L) 01/28/2019 0555   CHOLHDL 5.7 01/28/2019 0555   VLDL 29 01/28/2019 0555   LDLCALC 130 (H) 01/28/2019 0555   HgbA1c:  Lab Results  Component Value Date   HGBA1C 6.1 (H) 01/28/2019   Urine Drug Screen:     Component Value Date/Time   LABOPIA NONE DETECTED 01/27/2019 2345   COCAINSCRNUR NONE DETECTED 01/27/2019 2345   LABBENZ NONE DETECTED 01/27/2019 2345   AMPHETMU NONE DETECTED 01/27/2019 2345   THCU NONE DETECTED 01/27/2019 2345   LABBARB NONE DETECTED 01/27/2019 2345    Alcohol Level     Component Value Date/Time   ETH <11 01/23/2011 1625    IMAGING Ct Angio Head W Or Wo Contrast  Result Date: 01/28/2019 CLINICAL DATA:  53 y/o  M; Stroke, follow up. EXAM: CT ANGIOGRAPHY HEAD AND NECK TECHNIQUE: Multidetector CT imaging of the head and neck was performed using the standard protocol during bolus administration of intravenous contrast. Multiplanar CT image reconstructions and MIPs were obtained to evaluate the vascular anatomy. Carotid stenosis  measurements (when applicable) are obtained utilizing NASCET criteria, using the distal internal carotid diameter as the denominator. CONTRAST:  59mL OMNIPAQUE IOHEXOL 350 MG/ML SOLN COMPARISON:  01/27/2019 MRI of the head and CT of the head. FINDINGS: CTA NECK FINDINGS Aortic arch: Bovine variant branching. Imaged portion shows no evidence of aneurysm or dissection. No significant stenosis of the major arch vessel origins. Fibrofatty plaque of left common carotid artery origin without significant stenosis. Right carotid system: No evidence of dissection, stenosis (50% or greater) or occlusion. Left carotid system: No evidence of dissection, stenosis (50% or greater) or occlusion. Vertebral arteries: Left dominant. Lower left V2 segment moderate to severe stenosis with fibrofatty plaque (series 5, image 122). No evidence of dissection, stenosis (50% or greater) or occlusion. Skeleton: No acute finding. Mild cervical spondylosis. Other neck: Negative. 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 Review of the MIP images confirms the above findings CTA HEAD FINDINGS Anterior circulation: The proximal right ACA is severely attenuated and the left A1 is occluded. Downstream bilateral ACA distributions are severely attenuated with poor collateralization in the areas of chronic infarction. There are clusters of small vessels at the bilateral carotid termini likely representing collateralization due  to chronic occlusion/high-grade stenosis. Bilateral ICA and MCA distributions are patent without large vessel occlusion, aneurysm, or significant stenosis is identified. Posterior circulation: No significant stenosis, proximal occlusion, aneurysm, or vascular malformation. Venous sinuses: As permitted by contrast timing, patent. Anatomic variants: Small left posterior communicating artery. Possible right posterior communicating artery. Review of the MIP images confirms the above findings IMPRESSION: CTA neck: 1.  Left V2 segment moderate to severe stenosis and left common carotid artery origin mild stenosis with fibrofatty plaque. No additional high-grade stenosis, aneurysm, dissection of the carotid or vertebral arteries. 2. Paraseptal emphysema of right lung apex. CTA head: 1. Severe right A1 stenosis, left A1 occlusion, and severely attenuated/irregular downstream bilateral ACA distributions with poor collateralization. Intracranial circulation is otherwise unremarkable. Findings favor chronic sequelae vasculitis/vasculopathy and there is a faint Moyamoya pattern of collateralization at the [place] origins. Electronically Signed   By: Kristine Garbe M.D.   On: 01/28/2019 01:09   Ct Head Wo Contrast  Result Date: 01/27/2019 CLINICAL DATA:  TIA, double vision, dizziness EXAM: CT HEAD WITHOUT CONTRAST TECHNIQUE: Contiguous axial images were obtained from the base of the skull through the vertex without intravenous contrast. COMPARISON:  MR brain, 12/26/2013 FINDINGS: Brain: There is encephalomalacia of the anterior right corona radiata (series 3, image 22). There may be additional hypodensity of the inferior pons or medulla versus streak artifact from the skull base (series 3, image 9). Vascular: No hyperdense vessel or unexpected calcification. Skull: Normal. Negative for fracture or focal lesion. Sinuses/Orbits: No acute finding. Other: None. IMPRESSION: No definite acute intracranial pathology. There is encephalomalacia of the anterior right corona radiata (series 3, image 22). There may be additional hypodensity of the inferior pons or medulla consistent with age indeterminate lacunar infarction versus streak artifact from the skull base (series 3, image 9). Consider MRI to more sensitively evaluate for acute diffusion restricting infarction if indicated. Electronically Signed   By: Eddie Candle M.D.   On: 01/27/2019 17:12   Ct Angio Neck W And/or Wo Contrast  Result Date: 01/28/2019 CLINICAL DATA:  53 y/o   M; Stroke, follow up. EXAM: CT ANGIOGRAPHY HEAD AND NECK TECHNIQUE: Multidetector CT imaging of the head and neck was performed using the standard protocol during bolus administration of intravenous contrast. Multiplanar CT image reconstructions and MIPs were obtained to evaluate the vascular anatomy. Carotid stenosis measurements (when applicable) are obtained utilizing NASCET criteria, using the distal internal carotid diameter as the denominator. CONTRAST:  65mL OMNIPAQUE IOHEXOL 350 MG/ML SOLN COMPARISON:  01/27/2019 MRI of the head and CT of the head. FINDINGS: CTA NECK FINDINGS Aortic arch: Bovine variant branching. Imaged portion shows no evidence of aneurysm or dissection. No significant stenosis of the major arch vessel origins. Fibrofatty plaque of left common carotid artery origin without significant stenosis. Right carotid system: No evidence of dissection, stenosis (50% or greater) or occlusion. Left carotid system: No evidence of dissection, stenosis (50% or greater) or occlusion. Vertebral arteries: Left dominant. Lower left V2 segment moderate to severe stenosis with fibrofatty plaque (series 5, image 122). No evidence of dissection, stenosis (50% or greater) or occlusion. Skeleton: No acute finding. Mild cervical spondylosis. Other neck: Negative. 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 Review of the MIP images confirms the above findings CTA HEAD FINDINGS Anterior circulation: The proximal right ACA is severely attenuated and the left A1 is occluded. Downstream bilateral ACA distributions are severely attenuated with poor collateralization in the areas of chronic infarction. There are clusters of small  vessels at the bilateral carotid termini likely representing collateralization due to chronic occlusion/high-grade stenosis. Bilateral ICA and MCA distributions are patent without large vessel occlusion, aneurysm, or significant stenosis is identified. Posterior circulation: No  significant stenosis, proximal occlusion, aneurysm, or vascular malformation. Venous sinuses: As permitted by contrast timing, patent. Anatomic variants: Small left posterior communicating artery. Possible right posterior communicating artery. Review of the MIP images confirms the above findings IMPRESSION: CTA neck: 1. Left V2 segment moderate to severe stenosis and left common carotid artery origin mild stenosis with fibrofatty plaque. No additional high-grade stenosis, aneurysm, dissection of the carotid or vertebral arteries. 2. Paraseptal emphysema of right lung apex. CTA head: 1. Severe right A1 stenosis, left A1 occlusion, and severely attenuated/irregular downstream bilateral ACA distributions with poor collateralization. Intracranial circulation is otherwise unremarkable. Findings favor chronic sequelae vasculitis/vasculopathy and there is a faint Moyamoya pattern of collateralization at the [place] origins. Electronically Signed   By: Kristine Garbe M.D.   On: 01/28/2019 01:09   Mr Brain Wo Contrast  Result Date: 01/27/2019 CLINICAL DATA:  53 year old male with episode of dizziness this morning, visual changes. EXAM: MRI HEAD WITHOUT CONTRAST TECHNIQUE: Multiplanar, multiecho pulse sequences of the brain and surrounding structures were obtained without intravenous contrast. COMPARISON:  Head CT 1655 hours today.  Brain MRI 12/26/2013. FINDINGS: Brain: Tiny cortical area of restricted diffusion in the posterior medial right parietal lobe is seen on both axial and coronal diffusion weighted imaging (series 5, image 80). No other restricted diffusion. 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 Associated pericallosal T2 and FLAIR hyperintensity in both hemispheres, new since 2015. Some of these areas have questionable chronic hemosiderin (series 14, image 38). Elsewhere gray and white matter  signal is within normal limits. The deep gray matter nuclei, brainstem, and cerebellum appear normal. No midline shift, mass effect, evidence of mass lesion, ventriculomegaly, extra-axial collection or acute intracranial hemorrhage. Cervicomedullary junction and pituitary are within normal limits. Vascular: Major intracranial vascular flow voids are stable since 2015 with dominant appearing left vertebral artery. Skull and upper cervical spine: Negative visible cervical spine. Visualized bone marrow signal is within normal limits. Sinuses/Orbits: Negative orbits.  Paranasal sinuses remain clear. Other: Mastoids remain clear. Visible internal auditory structures appear normal. Scalp and face soft tissues appear negative. IMPRESSION: 1. Tiny cortical area of restricted diffusion in the posterior right parietal lobe might reflect acute ischemia. 2. However, there are unusual bilateral pericallosal signal changes, including encephalomalacia of some of the right cingulate gyrus, which are new since a 2015 MRI which was done for hearing loss. 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 Electronically Signed   By: Genevie Ann M.D.   On: 01/27/2019 23:24   Vas [place] Lower Extremity Venous (dvt)  Result Date: 01/28/2019  Lower Venous Study Indications: Stroke.  Comparison Study: No prior study. Performing Technologist: Maudry Mayhew RDMS, RVT, RDCS  Examination Guidelines: A complete evaluation includes B-mode imaging, spectral Doppler, color Doppler, and power Doppler as needed of all accessible portions of each vessel. Bilateral testing is considered an integral part of a complete examination. Limited examinations for reoccurring indications may be performed as noted.  +---------+---------------+---------+-----------+----------+-------+ RIGHT    CompressibilityPhasicitySpontaneityPropertiesSummary  +---------+---------------+---------+-----------+----------+-------+ CFV      Full           Yes      Yes                          +---------+---------------+---------+-----------+----------+-------+  SFJ      Full                                                 +---------+---------------+---------+-----------+----------+-------+ FV Prox  Full                                                 +---------+---------------+---------+-----------+----------+-------+ FV Mid   Full                                                 +---------+---------------+---------+-----------+----------+-------+ FV DistalFull                                                 +---------+---------------+---------+-----------+----------+-------+ PFV      Full                                                 +---------+---------------+---------+-----------+----------+-------+ POP      Full           Yes      Yes                          +---------+---------------+---------+-----------+----------+-------+ PTV      Full                                                 +---------+---------------+---------+-----------+----------+-------+ PERO     Full                                                 +---------+---------------+---------+-----------+----------+-------+   +---------+---------------+---------+-----------+----------+-------+ LEFT     CompressibilityPhasicitySpontaneityPropertiesSummary +---------+---------------+---------+-----------+----------+-------+ CFV      Full           Yes      Yes                          +---------+---------------+---------+-----------+----------+-------+ SFJ      Full                                                 +---------+---------------+---------+-----------+----------+-------+ FV Prox  Full                                                 +---------+---------------+---------+-----------+----------+-------+  FV Mid   Full                                                  +---------+---------------+---------+-----------+----------+-------+ FV DistalFull                                                 +---------+---------------+---------+-----------+----------+-------+ PFV      Full                                                 +---------+---------------+---------+-----------+----------+-------+ POP      Full           Yes      Yes                          +---------+---------------+---------+-----------+----------+-------+ PTV      Full                                                 +---------+---------------+---------+-----------+----------+-------+ PERO     Full                                                 +---------+---------------+---------+-----------+----------+-------+     Summary: Right: There is no evidence of deep vein thrombosis in the lower extremity. No cystic structure found in the popliteal fossa. Left: There is no evidence of deep vein thrombosis in the lower extremity. No cystic structure found in the popliteal fossa.  *See table(s) above for measurements and observations. Electronically signed by Harold Barban MD on 01/28/2019 at 6:49:24 PM.    Final    2D Echocardiogram   1. The left ventricle has normal systolic function with an ejection fraction of 60-65%. The cavity size was normal. There is moderately increased left ventricular wall thickness. Left ventricular diastolic Doppler parameters are consistent with impaired  relaxation. Elevated left atrial and left ventricular end-diastolic pressures The E/e' is >15. No evidence of left ventricular regional wall motion abnormalities.  2. The right ventricle has normal systolic function. The cavity was normal. There is no increase in right ventricular wall thickness.  3. The mitral valve is grossly normal.  4. The tricuspid valve is grossly normal.  5. The aortic valve was not well visualized. No stenosis of the  aortic valve.  6. The interatrial septum was not well visualized.   PHYSICAL EXAM  Temp:  [97.8 F (36.6 C)-98.2 F (36.8 C)] 97.9 F (36.6 C) (07/01 1245) Pulse Rate:  [60-75] 60 (07/01 1245) Resp:  [16-20] 18 (07/01 0804) BP: (149-169)/(81-99) 169/99 (07/01 1245) SpO2:  [92 %-100 %] 100 % (07/01 1245)  General - Well nourished, well developed, in no apparent distress.  Ophthalmologic - fundi not visualized due to noncooperation.  Cardiovascular - Regular rate and rhythm.  Mental Status -  Level of arousal and orientation to time, place, and person were intact. Language including expression, naming, repetition, comprehension was assessed and found intact. Attention span and concentration were normal. Recent and remote memory were intact. Fund of Knowledge was assessed and was intact.  Cranial Nerves II - XII - II - Visual field intact OU. III, IV, VI - Extraocular movements intact. V - Facial sensation intact bilaterally. VII - Facial movement intact bilaterally. VIII - Hearing & vestibular intact bilaterally. X - Palate elevates symmetrically. XI - Chin turning & shoulder shrug intact bilaterally. XII - Tongue protrusion intact.  Motor Strength - The patient's strength was normal in all extremities and pronator drift was absent.  Bulk was normal and fasciculations were absent.   Motor Tone - Muscle tone was assessed at the neck and appendages and was normal.  Reflexes - The patient's reflexes were symmetrical in all extremities and he had no pathological reflexes.  Sensory - Light touch, temperature/pinprick were assessed and were symmetrical.    Coordination - The patient had normal movements in the hands and feet with no ataxia or dysmetria.  Tremor was absent.  Gait and Station - normal gait.   ASSESSMENT/PLAN Mr. Zachary Powers is a 53 y.o. 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 HA following symptom resolution.  Stroke:  Tiny R parietal cortical infarcts, etiology unclear, could be due to large vessel disease vs. PFO.  Sussac syndrome is on differential, however patient no encephalopathy, hearing loss only on the left side, no history of vision loss, less likely.  CT head No acute abnormality. Encephalomalacia anter R CC.  MRI  Tiny cortical posterior R parietal lobe infarct. R singulate gyrus chronic infarct, new since 2015.   CTA head & neck L V2 mod to severe stenosis.  Nondominant right VA  2D Echo EF 60-65%. No source of embolus, normal LA size  LE venous Doppler no DVT  TCD bubble study showed spencer III PFO on valsalva only  LDL 130  HgbA1c 6.1  SCDs for VTE prophylaxis  No antiplatelet prior to admission, now on aspirin 325 mg daily and plavix. Continue DAPT for 3 months and then ASA alone.   Therapy recommendations:  None  Disposition:  Return home  Intracranial vascular stenosis  Bilateral ACA high-grade stenosis versus occlusion  Bilateral ACA territory moyamoya-like presentation  Dominant left VA V2 high-grade stenosis  Likely related to patient current smoking and history of cocaine abuse  On DAPT for 3 months and then aspirin alone.  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 Burt Knack to see if he would be a candidate for PFO closure.   Hx of silent stroke  MRI showed right cingulate gyrus encephalomalacia  New since MRI 2015  Likely related to bilateral ACA high-grade stenosis vs. Occlusion  Hypertension  Stable . Continue home meds . Long-term BP goal normotensive  Hyperlipidemia  Home meds:  No statin  Now on lipitor 80  LDL 130, goal <  70  Continue statin at discharge  Tobacco abuse  Current smoker  Smoking cessation counseling provided  Nicotine patch provided  Pt is  willing to quit  Other Stroke Risk Factors  Hx ETOH use, none x 8 yrs  Hx cocaine use, none x 8 yrs  Obesity, Body mass index is 29.7 kg/m., recommend weight loss, diet and exercise as appropriate   Family hx stroke (mother)  Other Active Problems  Sister with lupus, currently undergoing hospice level care  Left hearing loss on hearing aid  Hospital day # 1  Neurology will sign off. Please call with questions. Pt will follow up with stroke clinic NP at [facility] in about 4 weeks. Thanks for the consult.  Rosalin Hawking, MD PhD Stroke Neurology 01/29/2019 2:38 PM    To contact Stroke Continuity provider, please refer to [email or web address] After hours, contact General Neurology

## 2019-01-29 NOTE — Progress Notes (Signed)
Pt d/c home. Pt is stable with no new complains. D/c instructions done with teach back, pt verbalize understanding. Pt will be transported out of facility by family

## 2019-01-29 NOTE — Discharge Summary (Signed)
Physician Discharge Summary  Zachary Powers UMP:[id number] DOB: 09-27-1965 DOA: 01/27/2019  PCP: Nolene Ebbs, MD  Admit date: 01/27/2019 Discharge date: 01/29/2019  Admitted From: Home Disposition:  Home  Recommendations for Outpatient Follow-up:  1. Follow up with PCP in 2-3 weeks 2. Follow up with Neurology as scheduled 3. Follow up as outpatient for PFO closure  Discharge Condition:Stable CODE STATUS:Full Diet recommendation: Heart healthy   Brief/Interim Summary: 53 y.o. male with medical history significant of anxiety, asthma, depression, GERD, hypertension presenting to the hospital for evaluation of dizziness.  Patient was seen at the urgent care center today with complaints of dizziness and sent to the ED for further evaluation.  Patient states at work today he experienced acute onset dizziness, double vision, and gait instability.  Symptoms lasted 15 minutes.  Since then he is having a slight headache and intermittent flashing of lights in his vision.  Denies any focal weakness or numbness.  Denies history of prior stroke.  States he smokes 1/2 pack of cigarettes daily.  Reports compliance with home blood pressure medications.  States he stopped using illicit drugs and alcohol 8 years ago.  Denies any fevers, chills, chest pain, shortness breath, cough, abdominal pain, nausea, vomiting, or diarrhea.  No other complaints.  ED Course: Blood pressure 152/90, remainder of vitals stable.  No leukocytosis.  UA not suggestive of infection.  UDS pending. Head CT showing no definite acute intracranial pathology.  Evidence of encephalomalacia of the anterior right corona radiata and additional hypodensity of the inferior pons or medulla consistent with age-indeterminate lacunar infarction versus streak artifact from the skull base. Brain MRI showing tiny cortical area of restricted diffusion in the posterior right parietal lobe, might reflect acute ischemia.  Also showing unusual bilateral  peri-callosal signal changes, including encephalomalacia of some of the right cingulate gyrus which are new since MRI done in 2015  Discharge Diagnoses:  Principal Problem:   Acute CVA (cerebrovascular accident) ([place]) Active Problems:   Tobacco use   HTN (hypertension)  Acute ischemic stroke Risk factors include tobacco use and hypertension.  Presenting with complaints of dizziness, diplopia, and gait instability.  Symptoms lasted 15 minutes. Brain MRI showing tiny cortical area of restricted diffusion in the posterior right parietal lobe, might reflect acute ischemia.  Also showing unusual bilateral peri-callosal signal changes, including encephalomalacia of some of the right cingulate gyrus which are new since MRI done in 2015.  CT angiogram head showing severe right A1 stenosis, left A1 occlusion, and severely attenuated/irregular downstream bilateral ACA distributions with poor collateralization.  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  CTA neck showing left V2 segment moderate to severe stenosis and left common carotid artery origin mild stenosis. -Neurology following.  -2D echocardiogram with bubble study confirming PFO -LDL elevated at 130, now on statin -Neurology recommends Aspirin 325 p.o. with plavix x 3 month, then aspirin alone afterwards -Atorvastatin 80 mg daily -PT, OT, speech therapy seen and cleared -Counseled on smoking cessation -Per Neurology, recommendation for PFO closure as outpatient, to be arranged by Neurology  Tobacco use -Counseled to quit -NicoDerm patch while in hospital  Hypertension -Allowed permissive hypertension given acute ischemic stroke while in hospital -Resume meds on d/c   Discharge Instructions  Discharge Instructions    Ambulatory referral to Neurology   Complete by: As directed    An appointment is requested in approximately: 4 weeks     Allergies as of 01/29/2019      Reactions   Barbiturates  Recovering addict      Medication List    STOP taking these medications   amoxicillin 875 MG tablet Commonly known as: AMOXIL   benzonatate 200 MG capsule Commonly known as: TESSALON   docusate sodium 100 MG capsule Commonly known as: Colace   ipratropium 0.03 % nasal spray Commonly known as: Atrovent   methocarbamol 500 MG tablet Commonly known as: Robaxin   metoprolol succinate 25 MG 24 hr tablet Commonly known as: TOPROL-XL   oxyCODONE-acetaminophen 5-325 MG tablet Commonly known as: PERCOCET/ROXICET     TAKE these medications   aspirin 325 MG EC tablet Take 1 tablet (325 mg total) by mouth daily with breakfast.   atorvastatin 80 MG tablet Commonly known as: LIPITOR Take 1 tablet (80 mg total) by mouth daily at 6 PM.   clopidogrel 75 MG tablet Commonly known as: PLAVIX Take 1 tablet (75 mg total) by mouth daily. Start taking on: January 30, 2019   losartan 100 MG tablet Commonly known as: COZAAR Take 100 mg by mouth daily.   zolpidem 10 MG tablet Commonly known as: AMBIEN Take 10 mg by mouth at bedtime as needed for sleep.      Follow-up Information    Nolene Ebbs, MD. Schedule an appointment as soon as possible for a visit in 2 week(s).   Specialty: Internal Medicine Contact information: [address]  [place] [address] [phone]        Follow up with Neurology as scheduled Follow up.          Allergies  Allergen Reactions  . Barbiturates     Recovering addict    Consultations:  Neurology  Procedures/Studies: Ct Angio Head W Or Wo Contrast  Result Date: 01/28/2019 CLINICAL DATA:  53 y/o  M; Stroke, follow up. EXAM: CT ANGIOGRAPHY HEAD AND NECK TECHNIQUE: Multidetector CT imaging of the head and neck was performed using the standard protocol during bolus administration of intravenous contrast. Multiplanar CT image reconstructions and MIPs were obtained to evaluate the vascular anatomy. Carotid stenosis measurements (when  applicable) are obtained utilizing NASCET criteria, using the distal internal carotid diameter as the denominator. CONTRAST:  61mL OMNIPAQUE IOHEXOL 350 MG/ML SOLN COMPARISON:  01/27/2019 MRI of the head and CT of the head. FINDINGS: CTA NECK FINDINGS Aortic arch: Bovine variant branching. Imaged portion shows no evidence of aneurysm or dissection. No significant stenosis of the major arch vessel origins. Fibrofatty plaque of left common carotid artery origin without significant stenosis. Right carotid system: No evidence of dissection, stenosis (50% or greater) or occlusion. Left carotid system: No evidence of dissection, stenosis (50% or greater) or occlusion. Vertebral arteries: Left dominant. Lower left V2 segment moderate to severe stenosis with fibrofatty plaque (series 5, image 122). No evidence of dissection, stenosis (50% or greater) or occlusion. Skeleton: No acute finding. Mild cervical spondylosis. Other neck: Negative. 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 Review of the MIP images confirms the above findings CTA HEAD FINDINGS Anterior circulation: The proximal right ACA is severely attenuated and the left A1 is occluded. Downstream bilateral ACA distributions are severely attenuated with poor collateralization in the areas of chronic infarction. There are clusters of small vessels at the bilateral carotid termini likely representing collateralization due to chronic occlusion/high-grade stenosis. Bilateral ICA and MCA distributions are patent without large vessel occlusion, aneurysm, or significant stenosis is identified. Posterior circulation: No significant stenosis, proximal occlusion, aneurysm, or vascular malformation. Venous sinuses: As permitted by contrast timing, patent. Anatomic variants: Small left  posterior communicating artery. Possible right posterior communicating artery. Review of the MIP images confirms the above findings IMPRESSION: CTA neck: 1. Left V2 segment  moderate to severe stenosis and left common carotid artery origin mild stenosis with fibrofatty plaque. No additional high-grade stenosis, aneurysm, dissection of the carotid or vertebral arteries. 2. Paraseptal emphysema of right lung apex. CTA head: 1. Severe right A1 stenosis, left A1 occlusion, and severely attenuated/irregular downstream bilateral ACA distributions with poor collateralization. Intracranial circulation is otherwise unremarkable. Findings favor chronic sequelae vasculitis/vasculopathy and there is a faint Moy[place]oya pattern of collateralization at the [place] origins. Electronically Signed   By: Kristine Garbe M.D.   On: 01/28/2019 01:09   Ct Head Wo Contrast  Result Date: 01/27/2019 CLINICAL DATA:  TIA, double vision, dizziness EXAM: CT HEAD WITHOUT CONTRAST TECHNIQUE: Contiguous axial images were obtained from the base of the skull through the vertex without intravenous contrast. COMPARISON:  MR brain, 12/26/2013 FINDINGS: Brain: There is encephalomalacia of the anterior right corona radiata (series 3, image 22). There may be additional hypodensity of the inferior pons or medulla versus streak artifact from the skull base (series 3, image 9). Vascular: No hyperdense vessel or unexpected calcification. Skull: Normal. Negative for fracture or focal lesion. Sinuses/Orbits: No acute finding. Other: None. IMPRESSION: No definite acute intracranial pathology. There is encephalomalacia of the anterior right corona radiata (series 3, image 22). There may be additional hypodensity of the inferior pons or medulla consistent with age indeterminate lacunar infarction versus streak artifact from the skull base (series 3, image 9). Consider MRI to more sensitively evaluate for acute diffusion restricting infarction if indicated. Electronically Signed   By: Eddie Candle M.D.   On: 01/27/2019 17:12   Ct Angio Neck W And/or Wo Contrast  Result Date: 01/28/2019 CLINICAL DATA:  53 y/o  M; Stroke,  follow up. EXAM: CT ANGIOGRAPHY HEAD AND NECK TECHNIQUE: Multidetector CT imaging of the head and neck was performed using the standard protocol during bolus administration of intravenous contrast. Multiplanar CT image reconstructions and MIPs were obtained to evaluate the vascular anatomy. Carotid stenosis measurements (when applicable) are obtained utilizing NASCET criteria, using the distal internal carotid diameter as the denominator. CONTRAST:  68mL OMNIPAQUE IOHEXOL 350 MG/ML SOLN COMPARISON:  01/27/2019 MRI of the head and CT of the head. FINDINGS: CTA NECK FINDINGS Aortic arch: Bovine variant branching. Imaged portion shows no evidence of aneurysm or dissection. No significant stenosis of the major arch vessel origins. Fibrofatty plaque of left common carotid artery origin without significant stenosis. Right carotid system: No evidence of dissection, stenosis (50% or greater) or occlusion. Left carotid system: No evidence of dissection, stenosis (50% or greater) or occlusion. Vertebral arteries: Left dominant. Lower left V2 segment moderate to severe stenosis with fibrofatty plaque (series 5, image 122). No evidence of dissection, stenosis (50% or greater) or occlusion. Skeleton: No acute finding. Mild cervical spondylosis. Other neck: Negative. 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 Review of the MIP images confirms the above findings CTA HEAD FINDINGS Anterior circulation: The proximal right ACA is severely attenuated and the left A1 is occluded. Downstream bilateral ACA distributions are severely attenuated with poor collateralization in the areas of chronic infarction. There are clusters of small vessels at the bilateral carotid termini likely representing collateralization due to chronic occlusion/high-grade stenosis. Bilateral ICA and MCA distributions are patent without large vessel occlusion, aneurysm, or significant stenosis is identified. Posterior circulation: No significant  stenosis, proximal occlusion, aneurysm, or vascular malformation. Venous  sinuses: As permitted by contrast timing, patent. Anatomic variants: Small left posterior communicating artery. Possible right posterior communicating artery. Review of the MIP images confirms the above findings IMPRESSION: CTA neck: 1. Left V2 segment moderate to severe stenosis and left common carotid artery origin mild stenosis with fibrofatty plaque. No additional high-grade stenosis, aneurysm, dissection of the carotid or vertebral arteries. 2. Paraseptal emphysema of right lung apex. CTA head: 1. Severe right A1 stenosis, left A1 occlusion, and severely attenuated/irregular downstream bilateral ACA distributions with poor collateralization. Intracranial circulation is otherwise unremarkable. Findings favor chronic sequelae vasculitis/vasculopathy and there is a faint Moyamoya pattern of collateralization at the [place] origins. Electronically Signed   By: Kristine Garbe M.D.   On: 01/28/2019 01:09   Mr Brain Wo Contrast  Result Date: 01/27/2019 CLINICAL DATA:  53 year old male with episode of dizziness this morning, visual changes. EXAM: MRI HEAD WITHOUT CONTRAST TECHNIQUE: Multiplanar, multiecho pulse sequences of the brain and surrounding structures were obtained without intravenous contrast. COMPARISON:  Head CT 1655 hours today.  Brain MRI 12/26/2013. FINDINGS: Brain: Tiny cortical area of restricted diffusion in the posterior medial right parietal lobe is seen on both axial and coronal diffusion weighted imaging (series 5, image 80). No other restricted diffusion. 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 Associated pericallosal T2 and FLAIR hyperintensity in both hemispheres, new since 2015. Some of these areas have questionable chronic hemosiderin (series 14, image 38). Elsewhere gray and white matter signal is  within normal limits. The deep gray matter nuclei, brainstem, and cerebellum appear normal. No midline shift, mass effect, evidence of mass lesion, ventriculomegaly, extra-axial collection or acute intracranial hemorrhage. Cervicomedullary junction and pituitary are within normal limits. Vascular: Major intracranial vascular flow voids are stable since 2015 with dominant appearing left vertebral artery. Skull and upper cervical spine: Negative visible cervical spine. Visualized bone marrow signal is within normal limits. Sinuses/Orbits: Negative orbits.  Paranasal sinuses remain clear. Other: Mastoids remain clear. Visible internal auditory structures appear normal. Scalp and face soft tissues appear negative. IMPRESSION: 1. Tiny cortical area of restricted diffusion in the posterior right parietal lobe might reflect acute ischemia. 2. However, there are unusual bilateral pericallosal signal changes, including encephalomalacia of some of the right cingulate gyrus, which are new since a 2015 MRI which was done for hearing loss. 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 Electronically Signed   By: Genevie Ann M.D.   On: 01/27/2019 23:24   Vas [place] Lower Extremity Venous (dvt)  Result Date: 01/28/2019  Lower Venous Study Indications: Stroke.  Comparison Study: No prior study. Performing Technologist: Maudry Mayhew RDMS, RVT, RDCS  Examination Guidelines: A complete evaluation includes B-mode imaging, spectral Doppler, color Doppler, and power Doppler as needed of all accessible portions of each vessel. Bilateral testing is considered an integral part of a complete examination. Limited examinations for reoccurring indications may be performed as noted.  +---------+---------------+---------+-----------+----------+-------+ RIGHT    CompressibilityPhasicitySpontaneityPropertiesSummary  +---------+---------------+---------+-----------+----------+-------+ CFV      Full           Yes      Yes                          +---------+---------------+---------+-----------+----------+-------+ SFJ      Full                                                 +---------+---------------+---------+-----------+----------+-------+  FV Prox  Full                                                 +---------+---------------+---------+-----------+----------+-------+ FV Mid   Full                                                 +---------+---------------+---------+-----------+----------+-------+ FV DistalFull                                                 +---------+---------------+---------+-----------+----------+-------+ PFV      Full                                                 +---------+---------------+---------+-----------+----------+-------+ POP      Full           Yes      Yes                          +---------+---------------+---------+-----------+----------+-------+ PTV      Full                                                 +---------+---------------+---------+-----------+----------+-------+ PERO     Full                                                 +---------+---------------+---------+-----------+----------+-------+   +---------+---------------+---------+-----------+----------+-------+ LEFT     CompressibilityPhasicitySpontaneityPropertiesSummary +---------+---------------+---------+-----------+----------+-------+ CFV      Full           Yes      Yes                          +---------+---------------+---------+-----------+----------+-------+ SFJ      Full                                                 +---------+---------------+---------+-----------+----------+-------+ FV Prox  Full                                                 +---------+---------------+---------+-----------+----------+-------+ FV Mid   Full                                                  +---------+---------------+---------+-----------+----------+-------+ FV  DistalFull                                                 +---------+---------------+---------+-----------+----------+-------+ PFV      Full                                                 +---------+---------------+---------+-----------+----------+-------+ POP      Full           Yes      Yes                          +---------+---------------+---------+-----------+----------+-------+ PTV      Full                                                 +---------+---------------+---------+-----------+----------+-------+ PERO     Full                                                 +---------+---------------+---------+-----------+----------+-------+     Summary: Right: There is no evidence of deep vein thrombosis in the lower extremity. No cystic structure found in the popliteal fossa. Left: There is no evidence of deep vein thrombosis in the lower extremity. No cystic structure found in the popliteal fossa.  *See table(s) above for measurements and observations. Electronically signed by Harold Barban MD on 01/28/2019 at 6:49:24 PM.    Final      Subjective: Eager to go home  Discharge Exam: Vitals:   01/29/19 0804 01/29/19 1245  BP: (!) 150/81 (!) 169/99  Pulse: 71 60  Resp: 18   Temp: 98 F (36.7 C) 97.9 F (36.6 C)  SpO2: 99% 100%   Vitals:   01/29/19 0046 01/29/19 0350 01/29/19 0804 01/29/19 1245  BP: (!) 162/96 (!) 158/89 (!) 150/81 (!) 169/99  Pulse: 65 74 71 60  Resp: 18 20 18    Temp: 98.2 F (36.8 C) 97.8 F (36.6 C) 98 F (36.7 C) 97.9 F (36.6 C)  TempSrc: Oral   Oral  SpO2: 92% 97% 99% 100%  Weight:      Height:        General: Pt is alert, awake, not in acute distress Cardiovascular: RRR, S1/S2 +, no rubs, no gallops Respiratory: CTA bilaterally, no wheezing, no rhonchi Abdominal: Soft, NT, ND, bowel sounds  + Extremities: no edema, no cyanosis   The results of significant diagnostics from this hospitalization (including imaging, microbiology, ancillary and laboratory) are listed below for reference.     Microbiology: Recent Results (from the past 240 hour(s))  SARS Coronavirus 2 (CEPHEID - Performed in [facility] hospital lab), Hosp Order     Status: None   Collection Time: 01/28/19 12:26 AM   Specimen: Nasopharyngeal Swab  Result Value Ref Range Status   SARS Coronavirus 2 NEGATIVE NEGATIVE Final    Comment: (NOTE) If result is NEGATIVE SARS-CoV-2 target nucleic acids are NOT DETECTED. The SARS-CoV-2 RNA  is generally detectable in upper and lower  respiratory specimens during the acute phase of infection. The lowest  concentration of SARS-CoV-2 viral copies this assay can detect is 250  copies / mL. A negative result does not preclude SARS-CoV-2 infection  and should not be used as the sole basis for treatment or other  patient management decisions.  A negative result may occur with  improper specimen collection / handling, submission of specimen other  than nasopharyngeal swab, presence of viral mutation(s) within the  areas targeted by this assay, and inadequate number of viral copies  (<250 copies / mL). A negative result must be combined with clinical  observations, patient history, and epidemiological information. If result is POSITIVE SARS-CoV-2 target nucleic acids are DETECTED. The SARS-CoV-2 RNA is generally detectable in upper and lower  respiratory specimens dur ing the acute phase of infection.  Positive  results are indicative of active infection with SARS-CoV-2.  Clinical  correlation with patient history and other diagnostic information is  necessary to determine patient infection status.  Positive results do  not rule out bacterial infection or co-infection with other viruses. If result is PRESUMPTIVE POSTIVE SARS-CoV-2 nucleic acids MAY BE PRESENT.   A  presumptive positive result was obtained on the submitted specimen  and confirmed on repeat testing.  While 2019 novel coronavirus  (SARS-CoV-2) nucleic acids may be present in the submitted sample  additional confirmatory testing may be necessary for epidemiological  and / or clinical management purposes  to differentiate between  SARS-CoV-2 and other Sarbecovirus currently known to infect humans.  If clinically indicated additional testing with an alternate test  methodology [phone]) is advised. The SARS-CoV-2 RNA is generally  detectable in upper and lower respiratory sp ecimens during the acute  phase of infection. The expected result is Negative. Fact Sheet for Patients:  StrictlyIdeas.no Fact Sheet for Healthcare Providers: BankingDealers.co.za This test is not yet approved or cleared by the [place] FDA and has been authorized for detection and/or diagnosis of SARS-CoV-2 by FDA under an Emergency Use Authorization (EUA).  This EUA will remain in effect (meaning this test can be used) for the duration of the COVID-19 declaration under Section 564(b)(1) of the Act, 21 U.S.C. section 360bbb-3(b)(1), unless the authorization is terminated or revoked sooner. Performed at [facility], [place] [address], [place], [place] [address]      Labs: BNP (last 3 results) No results for input(s): BNP in the last 8760 hours. Basic Metabolic Panel: Recent Labs  Lab 01/27/19 1327  NA 139  K 4.1  CL 106  CO2 25  GLUCOSE 96  BUN 10  CREATININE 0.88  CALCIUM 9.7   Liver Function Tests: No results for input(s): AST, ALT, ALKPHOS, BILITOT, PROT, ALBUMIN in the last 168 hours. No results for input(s): LIPASE, AMYLASE in the last 168 hours. No results for input(s): AMMONIA in the last 168 hours. CBC: Recent Labs  Lab 01/27/19 1327  WBC 7.4  HGB 15.8  HCT 48.5  MCV 83.0  PLT 228   Cardiac Enzymes: No results for input(s):  CKTOTAL, CKMB, CKMBINDEX, TROPONINI in the last 168 hours. BNP: Invalid input(s): POCBNP CBG: No results for input(s): GLUCAP in the last 168 hours. D-Dimer No results for input(s): DDIMER in the last 72 hours. Hgb A1c Recent Labs    01/28/19 0555  HGBA1C 6.1*   Lipid Profile Recent Labs    01/28/19 0555  CHOL 193  HDL 34*  LDLCALC 130*  TRIG 144  CHOLHDL 5.7   Thyroid function studies No results for input(s): TSH, T4TOTAL, T3FREE, THYROIDAB in the last 72 hours.  Invalid input(s): FREET3 Anemia work up No results for input(s): VITAMINB12, FOLATE, FERRITIN, TIBC, IRON, RETICCTPCT in the last 72 hours. Urinalysis    Component Value Date/Time   COLORURINE YELLOW 01/27/2019 1720   APPEARANCEUR CLEAR 01/27/2019 1720   LABSPEC 1.016 01/27/2019 1720   PHURINE 6.0 01/27/2019 1720   GLUCOSEU NEGATIVE 01/27/2019 1720   HGBUR NEGATIVE 01/27/2019 1720   BILIRUBINUR NEGATIVE 01/27/2019 1720   KETONESUR NEGATIVE 01/27/2019 1720   PROTEINUR NEGATIVE 01/27/2019 1720   UROBILINOGEN 0.2 01/23/2011 1625   NITRITE NEGATIVE 01/27/2019 1720   LEUKOCYTESUR NEGATIVE 01/27/2019 1720   Sepsis Labs Invalid input(s): PROCALCITONIN,  WBC,  [facility] Microbiology Recent Results (from the past 240 hour(s))  SARS Coronavirus 2 (CEPHEID - Performed in [place] hospital lab), Hosp Order     Status: None   Collection Time: 01/28/19 12:26 AM   Specimen: Nasopharyngeal Swab  Result Value Ref Range Status   SARS Coronavirus 2 NEGATIVE NEGATIVE Final    Comment: (NOTE) If result is NEGATIVE SARS-CoV-2 target nucleic acids are NOT DETECTED. The SARS-CoV-2 RNA is generally detectable in upper and lower  respiratory specimens during the acute phase of infection. The lowest  concentration of SARS-CoV-2 viral copies this assay can detect is 250  copies / mL. A negative result does not preclude SARS-CoV-2 infection  and should not be used as the sole basis for treatment or other  patient  management decisions.  A negative result may occur with  improper specimen collection / handling, submission of specimen other  than nasopharyngeal swab, presence of viral mutation(s) within the  areas targeted by this assay, and inadequate number of viral copies  (<250 copies / mL). A negative result must be combined with clinical  observations, patient history, and epidemiological information. If result is POSITIVE SARS-CoV-2 target nucleic acids are DETECTED. The SARS-CoV-2 RNA is generally detectable in upper and lower  respiratory specimens dur ing the acute phase of infection.  Positive  results are indicative of active infection with SARS-CoV-2.  Clinical  correlation with patient history and other diagnostic information is  necessary to determine patient infection status.  Positive results do  not rule out bacterial infection or co-infection with other viruses. If result is PRESUMPTIVE POSTIVE SARS-CoV-2 nucleic acids MAY BE PRESENT.   A presumptive positive result was obtained on the submitted specimen  and confirmed on repeat testing.  While 2019 novel coronavirus  (SARS-CoV-2) nucleic acids may be present in the submitted sample  additional confirmatory testing may be necessary for epidemiological  and / or clinical management purposes  to differentiate between  SARS-CoV-2 and other Sarbecovirus currently known to infect humans.  If clinically indicated additional testing with an alternate test  methodology [phone]) is advised. The SARS-CoV-2 RNA is generally  detectable in upper and lower respiratory sp ecimens during the acute  phase of infection. The expected result is Negative. Fact Sheet for Patients:  StrictlyIdeas.no Fact Sheet for Healthcare Providers: BankingDealers.co.za This test is not yet approved or cleared by the [place] FDA and has been authorized for detection and/or diagnosis of SARS-CoV-2 by FDA under  an Emergency Use Authorization (EUA).  This EUA will remain in effect (meaning this test can be used) for the duration of the COVID-19 declaration under Section 564(b)(1) of the Act, 21 U.S.C. section 360bbb-3(b)(1), unless the authorization is terminated or revoked sooner. Performed at  [facility], [place] [address], [place], [place] [address]    Time spent: 30 min  SIGNED:   Marylu Lund, MD  [facility] 01/29/2019, 3:44 PM  If 7PM-7AM, please contact night-coverage

## 2019-01-29 NOTE — Progress Notes (Addendum)
TCD bubble  has been completed. Refer to [facility] under chart review to view preliminary results.   01/29/2019  Alla German

## 2019-01-30 ENCOUNTER — Telehealth: Payer: Self-pay

## 2019-01-30 ENCOUNTER — Encounter: Payer: Self-pay | Admitting: Neurology

## 2019-01-30 LAB — BETA-2-GLYCOPROTEIN I ABS, IGG/M/A
Beta-2 Glyco I IgG: <9 GPI IgG units (ref 0–20)
Beta-2-Glycoprotein I IgA: 10 GPI IgA units (ref 0–25)
Beta-2-Glycoprotein I IgM: <9 GPI IgM units (ref 0–32)

## 2019-01-30 LAB — CARDIOLIPIN ANTIBODIES, IGG, IGM, IGA
Anticardiolipin IgA: <9 APL U/mL (ref 0–11)
Anticardiolipin IgG: <9 GPL U/mL (ref 0–14)
Anticardiolipin IgM: <9 MPL U/mL (ref 0–12)

## 2019-01-30 NOTE — Telephone Encounter (Signed)
Per Dr. Burt Knack and Dr. Erlinda Hong, scheduled patient for PFO consult 02/21/19. He was grateful for call and agrees with treatment plan.

## 2019-02-04 LAB — FACTOR 5 LEIDEN

## 2019-02-05 LAB — PROTHROMBIN GENE MUTATION

## 2019-02-20 ENCOUNTER — Telehealth: Payer: Self-pay

## 2019-02-20 NOTE — Telephone Encounter (Signed)
    COVID-19 Pre-Screening Questions:  . In the past 7 to 10 days have you had a cough,  shortness of breath, headache, congestion, fever (100 or greater) body aches, chills, sore throat, or sudden loss of taste or sense of smell? . Have you been around anyone with known Covid 19. . Have you been around anyone who is awaiting Covid 19 test results in the past 7 to 10 days? . Have you been around anyone who has been exposed to Covid 19, or has mentioned symptoms of Covid 19 within the past 7 to 10 days?  If you have any concerns/questions about symptoms patients report during screening (either on the phone or at threshold). Contact the provider seeing the patient or DOD for further guidance.  If neither are available contact a member of the leadership team.       I called and spoke with patient, he answered no to all of the above questions. 

## 2019-02-21 ENCOUNTER — Encounter: Payer: Self-pay | Admitting: Cardiovascular Disease

## 2019-02-21 ENCOUNTER — Other Ambulatory Visit: Payer: Self-pay

## 2019-02-21 ENCOUNTER — Ambulatory Visit (INDEPENDENT_AMBULATORY_CARE_PROVIDER_SITE_OTHER): Payer: Medicaid Other | Admitting: Cardiovascular Disease

## 2019-02-21 VITALS — BP 138/80 | HR 77 | Ht 69.0 in | Wt 215.4 lb

## 2019-02-21 DIAGNOSIS — Q211 Atrial septal defect: Secondary | ICD-10-CM

## 2019-02-21 DIAGNOSIS — Q2112 Patent foramen ovale: Secondary | ICD-10-CM

## 2019-02-21 NOTE — H&P (View-Only) (Signed)
Cardiology Office Note:    Date:  02/21/2019   ID:  Zachary Powers, DOB 10/02/1965, MRN [id number]  PCP:  Nolene Ebbs, MD  Cardiologist:  No primary care provider on file.  Electrophysiologist:  None   Referring MD: Nolene Ebbs, MD   Chief Complaint  Patient presents with   PFO Consult    History of Present Illness:    Zachary Powers is a 52 y.o. 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 Erlinda Hong.  He presented with blurry/double vision, dizziness, and gait instability.  Symptoms improved over several hours.  Neuro imaging demonstrated small right parietal cortical infarcts considered etiologies included large vessel disease versus PFO.  Formal neurologic evaluation and stroke work-up was completed in the hospital.  This included a 2D echocardiogram demonstrated normal LV systolic function, no valvular disease, and no clear cardiac source of embolus.  Lower extremity venous Doppler was negative.  Hypercoagulable panel was negative.  The patient was found to have intracranial stenoses present.  A transcranial Doppler study demonstrated Spencer grade 3 shunt with Valsalva.  He is now referred for consideration of transcatheter PFO closure.  The patient has no past history of stroke until his recent presentation.  States that his hypertension has been well controlled on medical therapy over time.  He has a remote history of drug and alcohol use, but he has abstained now for at least 8 years.  Other risk factors include tobacco abuse.  He has no cardiac symptoms and specifically denies chest pain, chest pressure, or shortness of breath.  He denies leg swelling, orthopnea, PND, or heart palpitations.  Past Medical History:  Diagnosis Date   Anxiety    Asthma    Back pain    Depression    GERD (gastroesophageal reflux disease)    Hypertension     Past Surgical History:  Procedure Laterality Date    HAND SURGERY Left 1991   KNEE ARTHROSCOPY Left 04/10/2018   Procedure: Left knee arthroscopy, debridement, chondroplasty patella, possible menisectomy;  Surgeon: Susa Day, MD;  Location: [facility];  Service: Orthopedics;  Laterality: Left;  2 hrs for both procedures   QUADRICEPS TENDON REPAIR Left 04/10/2018   Procedure: Mini open quad tendon repair;  Surgeon: Susa Day, MD;  Location: [facility];  Service: Orthopedics;  Laterality: Left;    Current Medications: Current Meds  Medication Sig   aspirin EC 325 MG EC tablet Take 1 tablet (325 mg total) by mouth daily with breakfast.   atorvastatin (LIPITOR) 80 MG tablet Take 1 tablet (80 mg total) by mouth daily at 6 PM.   clopidogrel (PLAVIX) 75 MG tablet Take 1 tablet (75 mg total) by mouth daily.   losartan (COZAAR) 100 MG tablet Take 100 mg by mouth daily.   nicotine (NICODERM CQ - DOSED IN MG/24 HOURS) 14 mg/24hr patch Place 1 patch (14 mg total) onto the skin daily.   zolpidem (AMBIEN) 10 MG tablet Take 10 mg by mouth at bedtime as needed for sleep.     Allergies:   Barbiturates   Social History   Socioeconomic History   Marital status: Single    Spouse name: Not on file   Number of children: Not on file   Years of education: Not on file   Highest education level: Not on file  Occupational History   Not on file  Social Needs   Financial resource strain: Not on file  Food insecurity    Worry: Not on file    Inability: Not on file   Transportation needs    Medical: Not on file    Non-medical: Not on file  Tobacco Use   Smoking status: Current Every Day Smoker    Packs/day: 0.50    Types: Cigarettes   Smokeless tobacco: Never Used  Substance and Sexual Activity   Alcohol use: No    Alcohol/week: 0.0 standard drinks   Drug use: No    Comment: no use in 6 years   Sexual activity: Yes  Lifestyle   Physical activity    Days per week: Not on file    Minutes per session: Not on file   Stress:  Not on file  Relationships   Social connections    Talks on phone: Not on file    Gets together: Not on file    Attends religious service: Not on file    Active member of club or organization: Not on file    Attends meetings of clubs or organizations: Not on file    Relationship status: Not on file  Other Topics Concern   Not on file  Social History Narrative   Not on file     Family History: The patient's family history includes Diabetes in his father and paternal grandmother; Heart disease in his father; Stroke in his mother. There is no history of Colon cancer.  ROS:   Please see the history of present illness.    All other systems reviewed and are negative.  EKGs/Labs/Other Studies Reviewed:    The following studies were reviewed today: Echo 01-28-2019: IMPRESSIONS    1. The left ventricle has normal systolic function with an ejection fraction of 60-65%. The cavity size was normal. There is moderately increased left ventricular wall thickness. Left ventricular diastolic Doppler parameters are consistent with impaired  relaxation. Elevated left atrial and left ventricular end-diastolic pressures The E/e' is >15. No evidence of left ventricular regional wall motion abnormalities.  2. The right ventricle has normal systolic function. The cavity was normal. There is no increase in right ventricular wall thickness.  3. The mitral valve is grossly normal.  4. The tricuspid valve is grossly normal.  5. The aortic valve was not well visualized. No stenosis of the aortic valve.  6. The interatrial septum was not well visualized.  SUMMARY   LVEF 60-65%, moderate LVH, normal wall motion, grade 1 DD, elevated LV filling pressure, normal biatrial size, normal IVC, atrial septum not well visualized  FINDINGS  Left Ventricle: The left ventricle has normal systolic function, with an ejection fraction of 60-65%. The cavity size was normal. There is moderately increased left  ventricular wall thickness. Left ventricular diastolic Doppler parameters are consistent  with impaired relaxation. Elevated left atrial and left ventricular end-diastolic pressures The E/e' is >15. No evidence of left ventricular regional wall motion abnormalities..  Right Ventricle: The right ventricle has normal systolic function. The cavity was normal. There is no increase in right ventricular wall thickness.  Left Atrium: Left atrial size was normal in size.  Right Atrium: Right atrial size was normal in size.  Interatrial Septum: The interatrial septum was not well visualized.  Pericardium: There is no evidence of pericardial effusion.  Mitral Valve: The mitral valve is grossly normal. Mitral valve regurgitation is not visualized by color flow Doppler.  Tricuspid Valve: The tricuspid valve is grossly normal. Tricuspid valve regurgitation was not visualized by color flow Doppler.  Aortic Valve:  The aortic valve was not well visualized Aortic valve regurgitation was not visualized by color flow Doppler. There is No stenosis of the aortic valve.  Pulmonic Valve: The pulmonic valve was not well visualized. Pulmonic valve regurgitation is not visualized by color flow Doppler.  Venous: The inferior vena cava measures 1.79 cm, is normal in size with greater than 50% respiratory variability.  Transcranial Doppler Study: Summary:   A vascular evaluation was performed. The right middle cerebral artery was studied. An IV was inserted into the patient's left forearm. Verbal informed consent was obtained.   HITS heard at Valsalva Frederico Hamman III 0 HITS heard at rest. Dr. Erlinda Hong performed the test.  EKG:  EKG is not ordered today.  The ekg ordered 01/27/2019 demonstrates normal sinus rhythm 70 bpm, within normal limits  Recent Labs: 04/11/2018: ALT 18 04/12/2018: Magnesium 2.1 01/27/2019: BUN 10; Creatinine, Ser 0.88; Hemoglobin 15.8; Platelets 228; Potassium 4.1; Sodium 139  Recent  Lipid Panel    Component Value Date/Time   CHOL 193 01/28/2019 0555   TRIG 144 01/28/2019 0555   HDL 34 (L) 01/28/2019 0555   CHOLHDL 5.7 01/28/2019 0555   VLDL 29 01/28/2019 0555   LDLCALC 130 (H) 01/28/2019 0555    Physical Exam:    VS:  BP 138/80    Pulse 77    Ht 5\' 9"  (1.753 m)    Wt 215 lb 6.4 oz (97.7 kg)    SpO2 99%    BMI 31.81 kg/m     Wt Readings from Last 3 Encounters:  02/21/19 215 lb 6.4 oz (97.7 kg)  01/28/19 207 lb 0.2 oz (93.9 kg)  04/10/18 210 lb 8 oz (95.5 kg)     GEN:  Well nourished, well developed in no acute distress HEENT: Normal NECK: No JVD; No carotid bruits LYMPHATICS: No lymphadenopathy CARDIAC: RRR, no murmurs, rubs, gallops RESPIRATORY:  Clear to auscultation without rales, wheezing or rhonchi  ABDOMEN: Soft, non-tender, non-distended MUSCULOSKELETAL:  No edema; No deformity  SKIN: Warm and dry NEUROLOGIC:  Alert and oriented x 3 PSYCHIATRIC:  Normal affect   ASSESSMENT:    1. PFO (patent foramen ovale)    PLAN:    In order of problems listed above:  Cryptogenic stroke in patient with probable PFO based on positive transcranial Doppler study.  Radiographic results, hospital notes, lab results, and cardiac imaging data are reviewed.  Transesophageal echo is yet to be performed.  The patient's Rope Score is 5, indicating a 34% probability that the patient's stroke is 'PFO-related.'  The patient is counseled about the association of PFO and cryptogenic stroke. Available clinical trial data is reviewed, specifically those trials comparing transcatheter PFO closure and medical therapy with antiplatelet drugs. The patient understands the potential benefit of PFO closure with respect to secondary stroke reduction compared with medical therapy alone. Specific risks of transcatheter PFO closure are reviewed with the patient. These risks include bleeding, infection, device embolization, stroke, cardiac perforation, tamponade, arrhythmia, MI, and  late device erosion. He understands these serious risks occur at low incidence of < 1%.   We discussed considerations around decision making in the PFO closure versus medical therapy today.  I think TEE is critically important in determining anatomic features of the patient's PFO and based on these findings we can further risk stratify him.  Certainly if he has features such as large PFO, large shunt, or interatrial septal aneurysm, transcatheter closure would likely provide significant risk reduction of recurrence.  I have reviewed the  risks, indications, and alternatives of transesophageal echo with the patient.  We discussed COVID-19 testing protocols in the hospital and because of need for testing followed by self quarantine, will try to arrange transesophageal echo and transcatheter closure on the same day.  He understands that if his PFO is very small or there is some other cause of intracardiac shunt, we obviously would not proceed with transcatheter closure in that situation.  In the meantime he will continue on dual antiplatelet therapy with aspirin and clopidogrel.  He understands the need to continue these medications for at least 3 months if he does in fact undergo transcatheter PFO closure.  He also understands the need for 6 months of SBE prophylaxis when indicated after PFO closure.  Medication Adjustments/Labs and Tests Ordered: Current medicines are reviewed at length with the patient today.  Concerns regarding medicines are outlined above.  Orders Placed This Encounter  Procedures   Basic metabolic panel   CBC with Differential/Platelet   No orders of the defined types were placed in this encounter.   Patient Instructions  You are scheduled for COVID screening on Saturday, August 15 prior to your procedures. You will need to arrive at the [facility] drive thru testing site at 11:45AM. Please wear a mask. You will need to quarantine between your screening and your procedures on  August 19.   You have a follow-up appointment scheduled with Dr. Antionette Char assistant, Nell Range, on 04/23/2019 at 2:30PM.  PROCEDURE INSTRUCTIONS:  You are scheduled for a TEE with Dr. Marlou Porch and PFO CLOSURE with Dr. Burt Knack on Wednesday, March 19, 2019.  1. Please arrive at the [facility] (Main Entrance A) at [facility]: [address] [place], [place] [address] at 12:00 PM (This time is two hours before your procedure to ensure your preparation). Each patient is allowed ONE guest in the hospital. Please wear a mask. If you do not have a mask, one will be supplied to you.  Special note: Every effort is made to have your procedure done on time. Please understand that emergencies sometimes delay scheduled procedures.  2. Diet: Do not eat solid foods after midnight.  The patient may have clear liquids until 5am upon the day of the procedure.  3. Labs: TODAY!  4. Medication instructions in preparation for your procedure:  1) MAKE SURE TO TAKE YOUR ASPIRIN AND PLAVIX the morning of your procedures.  2) You may take other medications as directed with sips of water.  5. Plan for one night stay--bring personal belongings. 6. Bring a current list of your medications and current insurance cards. 7. You MUST have a responsible person to drive you home. 8. Someone MUST be with you the first 24 hours after you arrive home or your discharge will be delayed. 9. Please wear clothes that are easy to get on and off and wear slip-on shoes.  Thank you for allowing [place] to care for you!   -- [facility]    Signed, Sherren Mocha, MD  02/21/2019 1:49 PM    [place]

## 2019-02-21 NOTE — Patient Instructions (Addendum)
You are scheduled for COVID screening on Saturday, August 15 prior to your procedures. You will need to arrive at the [facility] drive thru testing site at 11:45AM. Please wear a mask. You will need to quarantine between your screening and your procedures on August 19.   You have a follow-up appointment scheduled with Dr. Antionette Char assistant, Nell Range, on 04/23/2019 at 2:30PM.  PROCEDURE INSTRUCTIONS:  You are scheduled for a TEE with Dr. Marlou Porch and PFO CLOSURE with Dr. Burt Knack on Wednesday, March 19, 2019.  1. Please arrive at the [facility] (Main Entrance A) at [facility]: [address] [place], [place] [address] at 12:00 PM (This time is two hours before your procedure to ensure your preparation). Each patient is allowed ONE guest in the hospital. Please wear a mask. If you do not have a mask, one will be supplied to you.  Special note: Every effort is made to have your procedure done on time. Please understand that emergencies sometimes delay scheduled procedures.  2. Diet: Do not eat solid foods after midnight.  The patient may have clear liquids until 5am upon the day of the procedure.  3. Labs: TODAY!  4. Medication instructions in preparation for your procedure:  1) MAKE SURE TO TAKE YOUR ASPIRIN AND PLAVIX the morning of your procedures.  2) You may take other medications as directed with sips of water.  5. Plan for one night stay--bring personal belongings. 6. Bring a current list of your medications and current insurance cards. 7. You MUST have a responsible person to drive you home. 8. Someone MUST be with you the first 24 hours after you arrive home or your discharge will be delayed. 9. Please wear clothes that are easy to get on and off and wear slip-on shoes.  Thank you for allowing [place] to care for you!   -- [place] [facility]

## 2019-02-21 NOTE — Progress Notes (Signed)
Cardiology Office Note:    Date:  02/21/2019   ID:  Charyl Dancer, DOB [birth year], MRN [id number]  PCP:  Nolene Ebbs, MD  Cardiologist:  No primary care provider on file.  Electrophysiologist:  None   Referring MD: Nolene Ebbs, MD   Chief Complaint  Patient presents with   PFO Consult    History of Present Illness:    Zachary Powers is a 53 y.o. 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 Erlinda Hong.  He presented with blurry/double vision, dizziness, and gait instability.  Symptoms improved over several hours.  Neuro imaging demonstrated small right parietal cortical infarcts considered etiologies included large vessel disease versus PFO.  Formal neurologic evaluation and stroke work-up was completed in the hospital.  This included a 2D echocardiogram demonstrated normal LV systolic function, no valvular disease, and no clear cardiac source of embolus.  Lower extremity venous Doppler was negative.  Hypercoagulable panel was negative.  The patient was found to have intracranial stenoses present.  A transcranial Doppler study demonstrated Spencer grade 3 shunt with Valsalva.  He is now referred for consideration of transcatheter PFO closure.  The patient has no past history of stroke until his recent presentation.  States that his hypertension has been well controlled on medical therapy over time.  He has a remote history of drug and alcohol use, but he has abstained now for at least 8 years.  Other risk factors include tobacco abuse.  He has no cardiac symptoms and specifically denies chest pain, chest pressure, or shortness of breath.  He denies leg swelling, orthopnea, PND, or heart palpitations.  Past Medical History:  Diagnosis Date   Anxiety    Asthma    Back pain    Depression    GERD (gastroesophageal reflux disease)    Hypertension     Past Surgical History:  Procedure Laterality Date    HAND SURGERY Left 1991   KNEE ARTHROSCOPY Left 04/10/2018   Procedure: Left knee arthroscopy, debridement, chondroplasty patella, possible menisectomy;  Surgeon: Susa Day, MD;  Location: [facility];  Service: Orthopedics;  Laterality: Left;  2 hrs for both procedures   QUADRICEPS TENDON REPAIR Left 04/10/2018   Procedure: Mini open quad tendon repair;  Surgeon: Susa Day, MD;  Location: [facility];  Service: Orthopedics;  Laterality: Left;    Current Medications: Current Meds  Medication Sig   aspirin EC 325 MG EC tablet Take 1 tablet (325 mg total) by mouth daily with breakfast.   atorvastatin (LIPITOR) 80 MG tablet Take 1 tablet (80 mg total) by mouth daily at 6 PM.   clopidogrel (PLAVIX) 75 MG tablet Take 1 tablet (75 mg total) by mouth daily.   losartan (COZAAR) 100 MG tablet Take 100 mg by mouth daily.   nicotine (NICODERM CQ - DOSED IN MG/24 HOURS) 14 mg/24hr patch Place 1 patch (14 mg total) onto the skin daily.   zolpidem (AMBIEN) 10 MG tablet Take 10 mg by mouth at bedtime as needed for sleep.     Allergies:   Barbiturates   Social History   Socioeconomic History   Marital status: Single    Spouse name: Not on file   Number of children: Not on file   Years of education: Not on file   Highest education level: Not on file  Occupational History   Not on file  Social Needs   Financial resource strain: Not on file  Food insecurity    Worry: Not on file    Inability: Not on file   Transportation needs    Medical: Not on file    Non-medical: Not on file  Tobacco Use   Smoking status: Current Every Day Smoker    Packs/day: 0.50    Types: Cigarettes   Smokeless tobacco: Never Used  Substance and Sexual Activity   Alcohol use: No    Alcohol/week: 0.0 standard drinks   Drug use: No    Comment: no use in 6 years   Sexual activity: Yes  Lifestyle   Physical activity    Days per week: Not on file    Minutes per session: Not on file   Stress:  Not on file  Relationships   Social connections    Talks on phone: Not on file    Gets together: Not on file    Attends religious service: Not on file    Active member of club or organization: Not on file    Attends meetings of clubs or organizations: Not on file    Relationship status: Not on file  Other Topics Concern   Not on file  Social History Narrative   Not on file     Family History: The patient's family history includes Diabetes in his father and paternal grandmother; Heart disease in his father; Stroke in his mother. There is no history of Colon cancer.  ROS:   Please see the history of present illness.    All other systems reviewed and are negative.  EKGs/Labs/Other Studies Reviewed:    The following studies were reviewed today: Echo 01-28-2019: IMPRESSIONS    1. The left ventricle has normal systolic function with an ejection fraction of 60-65%. The cavity size was normal. There is moderately increased left ventricular wall thickness. Left ventricular diastolic Doppler parameters are consistent with impaired  relaxation. Elevated left atrial and left ventricular end-diastolic pressures The E/e' is >15. No evidence of left ventricular regional wall motion abnormalities.  2. The right ventricle has normal systolic function. The cavity was normal. There is no increase in right ventricular wall thickness.  3. The mitral valve is grossly normal.  4. The tricuspid valve is grossly normal.  5. The aortic valve was not well visualized. No stenosis of the aortic valve.  6. The interatrial septum was not well visualized.  SUMMARY   LVEF 60-65%, moderate LVH, normal wall motion, grade 1 DD, elevated LV filling pressure, normal biatrial size, normal IVC, atrial septum not well visualized  FINDINGS  Left Ventricle: The left ventricle has normal systolic function, with an ejection fraction of 60-65%. The cavity size was normal. There is moderately increased left  ventricular wall thickness. Left ventricular diastolic Doppler parameters are consistent  with impaired relaxation. Elevated left atrial and left ventricular end-diastolic pressures The E/e' is >15. No evidence of left ventricular regional wall motion abnormalities..  Right Ventricle: The right ventricle has normal systolic function. The cavity was normal. There is no increase in right ventricular wall thickness.  Left Atrium: Left atrial size was normal in size.  Right Atrium: Right atrial size was normal in size.  Interatrial Septum: The interatrial septum was not well visualized.  Pericardium: There is no evidence of pericardial effusion.  Mitral Valve: The mitral valve is grossly normal. Mitral valve regurgitation is not visualized by color flow Doppler.  Tricuspid Valve: The tricuspid valve is grossly normal. Tricuspid valve regurgitation was not visualized by color flow Doppler.  Aortic Valve:  The aortic valve was not well visualized Aortic valve regurgitation was not visualized by color flow Doppler. There is No stenosis of the aortic valve.  Pulmonic Valve: The pulmonic valve was not well visualized. Pulmonic valve regurgitation is not visualized by color flow Doppler.  Venous: The inferior vena cava measures 1.79 cm, is normal in size with greater than 50% respiratory variability.  Transcranial Doppler Study: Summary:   A vascular evaluation was performed. The right middle cerebral artery was studied. An IV was inserted into the patient's left forearm. Verbal informed consent was obtained.   HITS heard at Valsalva Frederico Hamman III 0 HITS heard at rest. Dr. Erlinda Hong performed the test.  EKG:  EKG is not ordered today.  The ekg ordered 01/27/2019 demonstrates normal sinus rhythm 70 bpm, within normal limits  Recent Labs: 04/11/2018: ALT 18 04/12/2018: Magnesium 2.1 01/27/2019: BUN 10; Creatinine, Ser 0.88; Hemoglobin 15.8; Platelets 228; Potassium 4.1; Sodium 139  Recent  Lipid Panel    Component Value Date/Time   CHOL 193 01/28/2019 0555   TRIG 144 01/28/2019 0555   HDL 34 (L) 01/28/2019 0555   CHOLHDL 5.7 01/28/2019 0555   VLDL 29 01/28/2019 0555   LDLCALC 130 (H) 01/28/2019 0555    Physical Exam:    VS:  BP 138/80    Pulse 77    Ht 5\' 9"  (1.753 m)    Wt 215 lb 6.4 oz (97.7 kg)    SpO2 99%    BMI 31.81 kg/m     Wt Readings from Last 3 Encounters:  02/21/19 215 lb 6.4 oz (97.7 kg)  01/28/19 207 lb 0.2 oz (93.9 kg)  04/10/18 210 lb 8 oz (95.5 kg)     GEN:  Well nourished, well developed in no acute distress HEENT: Normal NECK: No JVD; No carotid bruits LYMPHATICS: No lymphadenopathy CARDIAC: RRR, no murmurs, rubs, gallops RESPIRATORY:  Clear to auscultation without rales, wheezing or rhonchi  ABDOMEN: Soft, non-tender, non-distended MUSCULOSKELETAL:  No edema; No deformity  SKIN: Warm and dry NEUROLOGIC:  Alert and oriented x 3 PSYCHIATRIC:  Normal affect   ASSESSMENT:    1. PFO (patent foramen ovale)    PLAN:    In order of problems listed above:  Cryptogenic stroke in patient with probable PFO based on positive transcranial Doppler study.  Radiographic results, hospital notes, lab results, and cardiac imaging data are reviewed.  Transesophageal echo is yet to be performed.  The patient's Rope Score is 5, indicating a 34% probability that the patient's stroke is 'PFO-related.'  The patient is counseled about the association of PFO and cryptogenic stroke. Available clinical trial data is reviewed, specifically those trials comparing transcatheter PFO closure and medical therapy with antiplatelet drugs. The patient understands the potential benefit of PFO closure with respect to secondary stroke reduction compared with medical therapy alone. Specific risks of transcatheter PFO closure are reviewed with the patient. These risks include bleeding, infection, device embolization, stroke, cardiac perforation, tamponade, arrhythmia, MI, and  late device erosion. He understands these serious risks occur at low incidence of < 1%.   We discussed considerations around decision making in the PFO closure versus medical therapy today.  I think TEE is critically important in determining anatomic features of the patient's PFO and based on these findings we can further risk stratify him.  Certainly if he has features such as large PFO, large shunt, or interatrial septal aneurysm, transcatheter closure would likely provide significant risk reduction of recurrence.  I have reviewed the  risks, indications, and alternatives of transesophageal echo with the patient.  We discussed COVID-19 testing protocols in the hospital and because of need for testing followed by self quarantine, will try to arrange transesophageal echo and transcatheter closure on the same day.  He understands that if his PFO is very small or there is some other cause of intracardiac shunt, we obviously would not proceed with transcatheter closure in that situation.  In the meantime he will continue on dual antiplatelet therapy with aspirin and clopidogrel.  He understands the need to continue these medications for at least 3 months if he does in fact undergo transcatheter PFO closure.  He also understands the need for 6 months of SBE prophylaxis when indicated after PFO closure.  Medication Adjustments/Labs and Tests Ordered: Current medicines are reviewed at length with the patient today.  Concerns regarding medicines are outlined above.  Orders Placed This Encounter  Procedures   Basic metabolic panel   CBC with Differential/Platelet   No orders of the defined types were placed in this encounter.   Patient Instructions  You are scheduled for COVID screening on Saturday, August 15 prior to your procedures. You will need to arrive at the [facility] drive thru testing site at 11:45AM. Please wear a mask. You will need to quarantine between your screening and your procedures on  August 19.   You have a follow-up appointment scheduled with Dr. Antionette Char assistant, Nell Range, on 04/23/2019 at 2:30PM.  PROCEDURE INSTRUCTIONS:  You are scheduled for a TEE with Dr. Marlou Porch and PFO CLOSURE with Dr. Burt Knack on Wednesday, March 19, 2019.  1. Please arrive at the [facility] (Main Entrance A) at [facility]: [address] [place], [place] [address] at 12:00 PM (This time is two hours before your procedure to ensure your preparation). Each patient is allowed ONE guest in the hospital. Please wear a mask. If you do not have a mask, one will be supplied to you.  Special note: Every effort is made to have your procedure done on time. Please understand that emergencies sometimes delay scheduled procedures.  2. Diet: Do not eat solid foods after midnight.  The patient may have clear liquids until 5am upon the day of the procedure.  3. Labs: TODAY!  4. Medication instructions in preparation for your procedure:  1) MAKE SURE TO TAKE YOUR ASPIRIN AND PLAVIX the morning of your procedures.  2) You may take other medications as directed with sips of water.  5. Plan for one night stay--bring personal belongings. 6. Bring a current list of your medications and current insurance cards. 7. You MUST have a responsible person to drive you home. 8. Someone MUST be with you the first 24 hours after you arrive home or your discharge will be delayed. 9. Please wear clothes that are easy to get on and off and wear slip-on shoes.  Thank you for allowing [place] to care for you!   -- [facility] services    Signed, Sherren Mocha, MD  02/21/2019 1:49 PM    [place]

## 2019-03-12 ENCOUNTER — Telehealth: Payer: Self-pay | Admitting: Cardiovascular Disease

## 2019-03-12 NOTE — Telephone Encounter (Signed)
New Message    Patient wants to know the after care for procedure Dr. Burt Knack is going to perform on him.  Please call patient back.

## 2019-03-12 NOTE — Telephone Encounter (Signed)
The patient did not have a job when he had his consult with Dr. 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 Reiterated to him that he will have to quarantine from Saturday to his procedure on Wednesday. Informed him the recovery time for the procedure is pretty quick, but it is recommended to take it easy for a week or so. He was also informed he will leave the hospital with personalized instructions.  He decided he will most likely ask for the week and will readdress after his procedure. He was grateful for assistance.

## 2019-03-13 ENCOUNTER — Ambulatory Visit: Payer: Medicaid Other | Admitting: Adult Health

## 2019-03-13 ENCOUNTER — Other Ambulatory Visit: Payer: Self-pay

## 2019-03-13 ENCOUNTER — Encounter: Payer: Self-pay | Admitting: Adult Health

## 2019-03-13 VITALS — BP 128/80 | HR 80 | Temp 96.9°F | Ht 69.0 in | Wt 212.0 lb

## 2019-03-13 DIAGNOSIS — I679 Cerebrovascular disease, unspecified: Secondary | ICD-10-CM | POA: Diagnosis not present

## 2019-03-13 DIAGNOSIS — I6389 Other cerebral infarction: Secondary | ICD-10-CM | POA: Diagnosis not present

## 2019-03-13 DIAGNOSIS — Q211 Atrial septal defect: Secondary | ICD-10-CM | POA: Diagnosis not present

## 2019-03-13 DIAGNOSIS — Z720 Tobacco use: Secondary | ICD-10-CM

## 2019-03-13 DIAGNOSIS — I10 Essential (primary) hypertension: Secondary | ICD-10-CM | POA: Diagnosis not present

## 2019-03-13 DIAGNOSIS — E785 Hyperlipidemia, unspecified: Secondary | ICD-10-CM

## 2019-03-13 DIAGNOSIS — Q2112 Patent foramen ovale: Secondary | ICD-10-CM

## 2019-03-13 NOTE — Patient Instructions (Signed)
Continue aspirin 325 mg daily and clopidogrel 75 mg daily  and Lipitor for secondary stroke prevention  Continue to follow up with PCP regarding cholesterol and blood pressure management   Undergo TEE with possible PFO closure on 03/19/2019 with Dr. Burt Knack -he will give you further instructions regarding ongoing use of aspirin and Plavix  Continue to stay active maintain healthy diet  Continue to monitor blood pressure at home  Maintain strict control of hypertension with blood pressure goal below 130/90, diabetes with hemoglobin A1c goal below 6.5% and cholesterol with LDL cholesterol (bad cholesterol) goal below 70 mg/dL. I also advised the patient to eat a healthy diet with plenty of whole grains, cereals, fruits and vegetables, exercise regularly and maintain ideal body weight.  Followup in the future with me in 4 months or call earlier if needed       Thank you for coming to see [place] at [facility]. I hope we have been able to provide you high quality care today.  You may receive a patient satisfaction survey over the next few weeks. We would appreciate your feedback and comments so that we may continue to improve ourselves and the health of our patients.

## 2019-03-13 NOTE — Progress Notes (Signed)
[facility] [address] [place]. [place] [address] [phone]       Bradley  Mr. MAYJOR AGER Date of Birth:  1965-12-05 Medical Record Number:  [id number]   Reason for Referral:  hospital stroke follow up    CHIEF COMPLAINT:  Chief Complaint  Patient presents with   Hospitalization Follow-up    Rm Treatment Rm:  alone.  Doing well.     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  History obtained from patient and chart review. Reviewed all radiology images and labs personally.  Mr. Zachary Powers is a 53 y.o. 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 HA following symptom resolution.  Neurology consulted with stroke work-up revealing tiny right parietal cortical infarcts within right parietal lobe and right cingulate gyrus chronic infarct (new since 2015 imaging) as evidenced on MRI.  CTA head/neck showed bilateral ICA high-grade stenosis versus occlusion, bilateral ACA territory moyamoya-like presentation, and dominant left VA P2 high-grade stenosis. 2D echo normal EF without cardiac source of embolus or PFO.  Lower extremity venous Dopplers negative for DVT TCD bubble study showed Spencer III PFO on Valsalva only.  Hypercoagulable panel negative.  Due to intracranial vascular stenosis, recommended DAPT for 3 months and aspirin alone.  ROPE score 4 and recommended follow-up with Dr. Burt Knack outpatient for possible PFO closure.  HTN stable.  LDL 130 initiated atorvastatin 80 mg daily.  Current tobacco user smoking cessation counseling provided.  Other stroke risk factors include  history of EtOH use, history of cocaine use, obesity, family history of stroke and history of silent stroke on imaging likely secondary to bilateral ICA high-grade stenosis versus occlusion.  Discharged home in stable condition without therapy needs.  Overall stable from a stroke standpoint without residual deficits He does endorse intermittent diplopia for about 4 weeks after admission but has since resolved.  Denies any other associated symptoms or headaches at that time. He also endorses approximately 1 week ago, he was experiencing intermittent right-sided upper lip "stiffness" that he would feel as he was speaking.  This lasted for back to me 4 days and then subsided.  He denies any other associated symptoms such as facial droop, weakness, numbness/tingling, vision changes or speech difficulty. He did have follow-up with Dr. Burt Knack with plans on undergoing TEE with possible PFO closure on 03/19/2019 Continues on DAPT aspirin 325 mg and clopidogrel 75 mg without bleeding or bruising Continues on atorvastatin without myalgias -does state he had recent lab work by PCP will level satisfactory (unable to view results) Blood pressure stable at 128/80 Tobacco use approximately 1 to 2 cigarettes/day which is decreased from prior now at 1 pack/day.  He plans on continuing to decrease amount until completely quits No further concerns at this time     ROS:   14 system review of systems performed and negative with exception of no concerns  PMH:  Past Medical History:  Diagnosis Date   Anxiety    Asthma    Back pain    Depression    GERD (gastroesophageal reflux disease)    Hypertension     PSH:  Past Surgical History:  Procedure Laterality Date   HAND SURGERY Left 1991   KNEE ARTHROSCOPY Left 04/10/2018   Procedure: Left knee arthroscopy, debridement, chondroplasty patella, possible menisectomy;  Surgeon: Susa Day, MD;  Location: [facility];  Service: Orthopedics;  Laterality:  Left;  2 hrs for both procedures   QUADRICEPS TENDON REPAIR Left 04/10/2018   Procedure: Mini open quad tendon repair;  Surgeon: Susa Day, MD;  Location: [facility];  Service: Orthopedics;  Laterality: Left;    Social History:  Social History   Socioeconomic History   Marital status: Single    Spouse name: Not on file   Number of children: Not on file   Years of education: Not on file   Highest education level: Not on file  Occupational History   Not on file  Social Needs   Financial resource strain: Not on file   Food insecurity    Worry: Not on file    Inability: Not on file   Transportation needs    Medical: Not on file    Non-medical: Not on file  Tobacco Use   Smoking status: Current Every Day Smoker    Packs/day: 0.50    Types: Cigarettes   Smokeless tobacco: Never Used  Substance and Sexual Activity   Alcohol use: No    Alcohol/week: 0.0 standard drinks   Drug use: No    Comment: no use in 6 years   Sexual activity: Yes  Lifestyle   Physical activity    Days per week: Not on file    Minutes per session: Not on file   Stress: Not on file  Relationships   Social connections    Talks on phone: Not on file    Gets together: Not on file    Attends religious service: Not on file    Active member of club or organization: Not on file    Attends meetings of clubs or organizations: Not on file    Relationship status: Not on file   Intimate partner violence    Fear of current or ex partner: Not on file    Emotionally abused: Not on file    Physically abused: Not on file    Forced sexual activity: Not on file  Other Topics Concern   Not on file  Social History Narrative   Not on file    Family History:  Family History  Problem Relation Age of Onset   Diabetes Father    Heart disease Father    Diabetes Paternal Grandmother    Stroke Mother    Colon cancer Neg Hx     Medications:   Current Outpatient Medications on File Prior to  Visit  Medication Sig Dispense Refill   aspirin EC 325 MG EC tablet Take 1 tablet (325 mg total) by mouth daily with breakfast. 30 tablet 0   atorvastatin (LIPITOR) 80 MG tablet Take 1 tablet (80 mg total) by mouth daily at 6 PM. 30 tablet 1   BLACK CURRANT SEED OIL PO Take 1 capsule by mouth daily.     clopidogrel (PLAVIX) 75 MG tablet Take 75 mg by mouth daily.     diclofenac (VOLTAREN) 75 MG EC tablet Take 150 mg by mouth daily.     Flaxseed, Linseed, (FLAX SEED OIL PO) Take 1 capsule by mouth daily.     hydrochlorothiazide (HYDRODIURIL) 25 MG tablet Take 25 mg by mouth daily.     losartan (COZAAR) 100 MG tablet Take 100 mg by mouth daily.  Multiple Vitamin (MULTIVITAMIN WITH MINERALS) TABS tablet Take 1 tablet by mouth daily.     zolpidem (AMBIEN) 10 MG tablet Take 10 mg by mouth at bedtime as needed for sleep.     nicotine (NICODERM CQ - DOSED IN MG/24 HOURS) 14 mg/24hr patch Place 1 patch (14 mg total) onto the skin daily. (Patient not taking: Reported on 03/13/2019) 28 patch 0   No current facility-administered medications on file prior to visit.     Allergies:   Allergies  Allergen Reactions   Barbiturates     Recovering addict     Physical Exam  Vitals:   03/13/19 0920  BP: 128/80  Pulse: 80  Temp: (!) 96.9 F (36.1 C)  Weight: 212 lb (96.2 kg)  Height: 5\' 9"  (1.753 m)   Body mass index is 31.31 kg/m. No exam data present  Depression screen [facility] 2/9 03/13/2019  Decreased Interest 0  Down, Depressed, Hopeless 0  PHQ - 2 Score 0     General: well developed, well nourished,  pleasant middle-aged African-American male, seated, in no evident distress Head: head normocephalic and atraumatic.   Neck: supple with no carotid or supraclavicular bruits Cardiovascular: regular rate and rhythm, no murmurs Musculoskeletal: no deformity Skin:  no rash/petichiae Vascular:  Normal pulses all extremities   Neurologic Exam Mental Status: Awake and fully alert.  Oriented to place and time. Recent and remote memory intact. Attention span, concentration and fund of knowledge appropriate. Mood and affect appropriate.  Cranial Nerves: Fundoscopic exam reveals sharp disc margins. Pupils equal, briskly reactive to light. Extraocular movements full without nystagmus. Visual fields full to confrontation. Hearing intact. Facial sensation intact. Face, tongue, palate moves normally and symmetrically.  Motor: Normal bulk and tone. Normal strength in all tested extremity muscles. Sensory.: intact to touch , pinprick , position and vibratory sensation.  Coordination: Rapid alternating movements normal in all extremities. Finger-to-nose and heel-to-shin performed accurately bilaterally. Gait and Station: Arises from chair without difficulty. Stance is normal. Gait demonstrates normal stride length and balance Reflexes: 1+ and symmetric. Toes downgoing.     NIHSS  0 Modified Rankin  0 ROPE score 5   Diagnostic Data (Labs, Imaging, Testing)  CT HEAD WO CONTRAST 01/27/2019 IMPRESSION: No definite acute intracranial pathology. There is encephalomalacia of the anterior right corona radiata (series 3, image 22). There may be additional hypodensity of the inferior pons or medulla consistent with age indeterminate lacunar infarction versus streak artifact from the skull base (series 3, image 9). Consider MRI to more sensitively evaluate for acute diffusion restricting infarction if indicated.  CT ANGIO HEAD W OR WO CONTRAST CT ANGIO NECK W OR WO CONTRAST 01/28/2019 CTA head: 1. Severe right A1 stenosis, left A1 occlusion, and severely attenuated/irregular downstream bilateral ACA distributions with poor collateralization. Intracranial circulation is otherwise unremarkable. Findings favor chronic sequelae vasculitis/vasculopathy and there is a faint Moyamoya pattern of collateralization at the [place] origins. IMPRESSION: CTA neck: 1. Left V2 segment moderate to  severe stenosis and left common carotid artery origin mild stenosis with fibrofatty plaque. No additional high-grade stenosis, aneurysm, dissection of the carotid or vertebral arteries. 2. Paraseptal emphysema of right lung apex.  MR BRAIN WO CONTRAST 01/27/2019 IMPRESSION: 1. Tiny cortical area of restricted diffusion in the posterior right parietal lobe might reflect acute ischemia. 2. However, there are unusual bilateral pericallosal signal changes, including encephalomalacia of some of the right cingulate gyrus, which are new since a 2015 MRI which was done for hearing loss. 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   ECHOCARDIOGRAM 01/28/2019 IMPRESSIONS  1. The left ventricle has normal systolic function with an ejection fraction of 60-65%. The cavity size was normal. There is moderately increased left ventricular wall thickness. Left ventricular diastolic Doppler parameters are consistent with impaired  relaxation. Elevated left atrial and left ventricular end-diastolic pressures The E/e' is >15. No evidence of left ventricular regional wall motion abnormalities.  2. The right ventricle has normal systolic function. The cavity was normal. There is no increase in right ventricular wall thickness.  3. The mitral valve is grossly normal.  4. The tricuspid valve is grossly normal.  5. The aortic valve was not well visualized. No stenosis of the aortic valve.  6. The interatrial septum was not well visualized.  VAS [place] TRANSCRANIAL DOPPLERS WITH BUBBLES 01/29/2019 Summary:   A vascular evaluation was performed. The right middle cerebral artery was studied. An IV was inserted into the patient's left forearm. Verbal informed consent was obtained.   HITS heard at Valsalva Frederico Hamman III 0 HITS heard at rest. Dr. Erlinda Hong performed the test.  Positive TCD Bubble study indicative of a small right to left shunt  with valsalva only *See table(s) above for measurements and observations.     ASSESSMENT: Zachary Powers is a 53 y.o. year old male presented with near syncope, couple spots in visual fields and headache on 01/27/2019 with stroke work-up revealing tiny right parietal cortical infarcts likely secondary to large vessel disease versus PFO. Vascular risk factors include HTN, HLD, history of substance abuse 8 years prior, intracranial vascular stenosis, PFO, history of stroke and tobacco use.  Plans on undergoing TEE with possible PFO closure on 03/19/2019 with Dr. Burt Knack.  After discharge, experience intermittent diplopia for approximately 4 weeks but has since resolved.  He has also experienced vague symptom of right upper lip "stiffness" while speaking intermittently approximately 1 week ago which lasted for 4 days without any other associated symptoms (see HPI).  No symptoms currently will residual deficits.    PLAN:  1. Right parietal cortical infarcts: Continue aspirin 325 mg daily and clopidogrel 75 mg daily  and atorvastatin for secondary stroke prevention. Maintain strict control of hypertension with blood pressure goal below 130/90, diabetes with hemoglobin A1c goal below 6.5% and cholesterol with LDL cholesterol (bad cholesterol) goal below 70 mg/dL.  I also advised the patient to eat a healthy diet with plenty of whole grains, cereals, fruits and vegetables, exercise regularly with at least 30 minutes of continuous activity daily and maintain ideal body weight. 2. PFO: Will potentially have closure on 03/19/2019 with Dr. Burt Knack after TEE.  Advised him that Dr. Burt Knack will provide him with additional instructions regarding DAPT duration 3. HTN: Advised to continue current treatment regimen.  Today's BP stable.  Advised to continue to monitor at home along with continued follow-up with PCP for management 4. HLD: Advised to continue current treatment regimen along with continued follow-up with PCP for  future prescribing and monitoring of lipid panel 5. Tobacco use: Highly encourage complete smoking cessation 6. Intracranial vascular stenosis: Likely related to current smoking and history of cocaine abuse.  Recommended DAPT for 3 months then aspirin alone along with ongoing management of HTN, HLD and smoking cessation 7. Intermittent symptoms: Advised him to proceed to ED with any additional neurological type symptoms or stroke/TIA symptoms    Follow up in 4 months or call earlier if needed   Greater than  50% of time during this 45 minute visit was spent on counseling, explanation of diagnosis of right parietal cortical infarcts, reviewing risk factor management of PFO, intracranial vascular stenosis, HTN, HLD and tobacco use, planning of further management along with potential future management, and discussion with patient and family answering all questions.    Venancio Poisson, AGNP-BC  [facility] [address] [place], [place] [address]  Phone [phone] Fax [phone] Note: This document was prepared with digital dictation and possible smart phrase technology. Any transcriptional errors that result from this process are unintentional.

## 2019-03-15 ENCOUNTER — Other Ambulatory Visit (HOSPITAL_COMMUNITY)
Admission: RE | Admit: 2019-03-15 | Discharge: 2019-03-15 | Disposition: A | Discharge status: Home / Self Care | Payer: Medicaid Other | Source: Ambulatory Visit | Attending: Cardiology | Admitting: Cardiology

## 2019-03-15 DIAGNOSIS — Z20828 Contact with and (suspected) exposure to other viral communicable diseases: Secondary | ICD-10-CM | POA: Insufficient documentation

## 2019-03-15 DIAGNOSIS — Q211 Atrial septal defect: Secondary | ICD-10-CM | POA: Insufficient documentation

## 2019-03-15 DIAGNOSIS — Z01812 Encounter for preprocedural laboratory examination: Secondary | ICD-10-CM | POA: Insufficient documentation

## 2019-03-15 LAB — SARS CORONAVIRUS 2 (TAT 6-24 HRS): SARS Coronavirus 2: NEGATIVE

## 2019-03-15 NOTE — Progress Notes (Signed)
I agree with the above plan 

## 2019-03-18 ENCOUNTER — Other Ambulatory Visit: Payer: Self-pay

## 2019-03-18 ENCOUNTER — Telehealth: Payer: Self-pay | Admitting: *Deleted

## 2019-03-18 ENCOUNTER — Other Ambulatory Visit: Payer: Medicaid Other

## 2019-03-18 DIAGNOSIS — Z01812 Encounter for preprocedural laboratory examination: Secondary | ICD-10-CM

## 2019-03-18 DIAGNOSIS — Q211 Atrial septal defect: Secondary | ICD-10-CM

## 2019-03-18 DIAGNOSIS — Q2112 Patent foramen ovale: Secondary | ICD-10-CM

## 2019-03-18 LAB — BASIC METABOLIC PANEL
BUN/Creatinine Ratio: 13 (ref 9–20)
BUN: 12 mg/dL (ref 6–24)
CO2: 28 mmol/L (ref 20–29)
Calcium: 9.6 mg/dL (ref 8.7–10.2)
Chloride: 101 mmol/L (ref 96–106)
Creatinine, Ser: 0.94 mg/dL (ref 0.76–1.27)
GFR calc Af Amer: 107 mL/min/{1.73_m2} (ref >59)
GFR calc non Af Amer: 92 mL/min/{1.73_m2} (ref >59)
Glucose: 127 mg/dL — ABNORMAL HIGH (ref 65–99)
Potassium: 3.9 mmol/L (ref 3.5–5.2)
Sodium: 138 mmol/L (ref 134–144)

## 2019-03-18 LAB — CBC
Hematocrit: 47 % (ref 37.5–51.0)
Hemoglobin: 16.2 g/dL (ref 13.0–17.7)
MCH: 27.6 pg (ref 26.6–33.0)
MCHC: 34.5 g/dL (ref 31.5–35.7)
MCV: 80 fL (ref 79–97)
Platelets: 233 10*3/uL (ref 150–450)
RBC: 5.88 x10E6/uL — ABNORMAL HIGH (ref 4.14–5.80)
RDW: 14.6 % (ref 11.6–15.4)
WBC: 6.8 10*3/uL (ref 3.4–10.8)

## 2019-03-18 NOTE — Telephone Encounter (Addendum)
Pt contacted pre-catheterization scheduled at [facility] for: Wednesday March 09, 2019 2:30 PM/TEE 1:30 PM Verified arrival time and place: [facility]) at: 12 noon  Nothing to eat or drink after midnight prior to procedure, except sips of water to take medications.   Hold: HCTZ-AM of procedures.  Except hold medications AM meds can be  taken pre-cath with sip of water including: ASA 81 mg-pt will take ASA 325 mg, his usual dose Plavix 75 mg  Confirmed patient has responsible person to drive home post procedure and observe 24 hours after arriving home: yes  Currently, due to Covid-19 pandemic, only one support person will be allowed with patient. Must be the same support person for that patient's entire stay, will be screened and required to wear a mask. They will be asked to wait in the waiting room for the duration of the patient's stay.  Patients are required to wear a mask when they enter the hospital.      COVID-19 Pre-Screening Questions:  . In the past 7 to 10 days have you had a cough,  shortness of breath, headache, congestion, fever (100 or greater) body aches, chills, sore throat, or sudden loss of taste or sense of smell? no . Have you been around anyone with known Covid 19? no . Have you been around anyone who is awaiting Covid 19 test results in the past 7 to 10 days? no . Have you been around anyone who has been exposed to Covid 19, or has mentioned symptoms of Covid 19 within the past 7 to 10 days? no    I reviewed procedure/mask/visitor, Covid-19 screening questions with patient, he verbalized understanding, thanked me for call.

## 2019-03-18 NOTE — Progress Notes (Signed)
Pt states he has been quarantined since his covid test and has no fevers of shortness of breath or fevers. PT understands visitor policy

## 2019-03-18 NOTE — Telephone Encounter (Addendum)
  Pt did not have pre procedure BMP/CBC 02/21/19--I spoke with patient-he is going to [facility] lab now for STAT BMP/CBC. Pt knows to wear a mask to office for lab and should not go anywhere else, continue to self-quarantine. I reviewed Covid-19 screening questions with patient.    COVID-19 Pre-Screening Questions:  . In the past 7 to 10 days have you had a cough,  shortness of breath, headache, congestion, fever (100 or greater) body aches, chills, sore throat, or sudden loss of taste or sense of smell? no . Have you been around anyone with known Covid 19? no . Have you been around anyone who is awaiting Covid 19 test results in the past 7 to 10 days? no . Have you been around anyone who has been exposed to Covid 19, or has mentioned symptoms of Covid 19 within the past 7 to 10 days? no

## 2019-03-19 ENCOUNTER — Other Ambulatory Visit: Payer: Self-pay

## 2019-03-19 ENCOUNTER — Encounter (HOSPITAL_COMMUNITY)
Admission: RE | Disposition: A | Discharge status: Home / Self Care | Payer: Medicaid Other | Source: Home / Self Care | Attending: Cardiology

## 2019-03-19 ENCOUNTER — Encounter (HOSPITAL_COMMUNITY): Payer: Self-pay | Admitting: Emergency Medicine

## 2019-03-19 ENCOUNTER — Encounter (HOSPITAL_COMMUNITY)
Admission: RE | Disposition: A | Discharge status: Home / Self Care | Payer: Self-pay | Source: Home / Self Care | Attending: Cardiology

## 2019-03-19 ENCOUNTER — Ambulatory Visit (HOSPITAL_COMMUNITY): Payer: Medicaid Other

## 2019-03-19 ENCOUNTER — Ambulatory Visit (HOSPITAL_COMMUNITY)
Admission: RE | Admit: 2019-03-19 | Discharge: 2019-03-19 | Disposition: A | Discharge status: Home / Self Care | Payer: Medicaid Other | Attending: Cardiology | Admitting: Cardiology

## 2019-03-19 ENCOUNTER — Ambulatory Visit (HOSPITAL_BASED_OUTPATIENT_CLINIC_OR_DEPARTMENT_OTHER)
Admission: RE | Admit: 2019-03-19 | Discharge: 2019-03-19 | Disposition: A | Discharge status: Home / Self Care | Payer: Medicaid Other | Source: Ambulatory Visit | Attending: Cardiovascular Disease | Admitting: Cardiovascular Disease

## 2019-03-19 ENCOUNTER — Other Ambulatory Visit (HOSPITAL_COMMUNITY): Payer: Self-pay | Admitting: *Deleted

## 2019-03-19 ENCOUNTER — Other Ambulatory Visit: Payer: Self-pay | Admitting: Cardiovascular Disease

## 2019-03-19 DIAGNOSIS — I10 Essential (primary) hypertension: Secondary | ICD-10-CM | POA: Diagnosis not present

## 2019-03-19 DIAGNOSIS — Q2112 Patent foramen ovale: Secondary | ICD-10-CM

## 2019-03-19 DIAGNOSIS — J45909 Unspecified asthma, uncomplicated: Secondary | ICD-10-CM | POA: Diagnosis not present

## 2019-03-19 DIAGNOSIS — Q211 Atrial septal defect: Secondary | ICD-10-CM

## 2019-03-19 DIAGNOSIS — F329 Major depressive disorder, single episode, unspecified: Secondary | ICD-10-CM | POA: Insufficient documentation

## 2019-03-19 DIAGNOSIS — Z7902 Long term (current) use of antithrombotics/antiplatelets: Secondary | ICD-10-CM | POA: Insufficient documentation

## 2019-03-19 DIAGNOSIS — K219 Gastro-esophageal reflux disease without esophagitis: Secondary | ICD-10-CM | POA: Insufficient documentation

## 2019-03-19 DIAGNOSIS — F17210 Nicotine dependence, cigarettes, uncomplicated: Secondary | ICD-10-CM | POA: Diagnosis not present

## 2019-03-19 DIAGNOSIS — Z7982 Long term (current) use of aspirin: Secondary | ICD-10-CM | POA: Insufficient documentation

## 2019-03-19 DIAGNOSIS — F419 Anxiety disorder, unspecified: Secondary | ICD-10-CM | POA: Diagnosis not present

## 2019-03-19 DIAGNOSIS — Z79899 Other long term (current) drug therapy: Secondary | ICD-10-CM | POA: Insufficient documentation

## 2019-03-19 HISTORY — DX: Atrial septal defect: Q21.1

## 2019-03-19 HISTORY — DX: Patent foramen ovale: Q21.12

## 2019-03-19 HISTORY — PX: TEE WITHOUT CARDIOVERSION: SHX5443

## 2019-03-19 HISTORY — DX: Cerebral infarction, unspecified: I63.9

## 2019-03-19 HISTORY — PX: PATENT FORAMEN OVALE(PFO) CLOSURE: CATH118300

## 2019-03-19 HISTORY — PX: BUBBLE STUDY: SHX6837

## 2019-03-19 LAB — ECHOCARDIOGRAM LIMITED
Height: 69 in
Weight: 3360 oz

## 2019-03-19 LAB — POCT ACTIVATED CLOTTING TIME
Activated Clotting Time: 274 seconds
Activated Clotting Time: 186 seconds
Activated Clotting Time: 164 seconds

## 2019-03-19 SURGERY — PATENT FORAMEN OVALE (PFO) CLOSURE
Anesthesia: LOCAL

## 2019-03-19 SURGERY — ECHOCARDIOGRAM, TRANSESOPHAGEAL
Anesthesia: Moderate Sedation

## 2019-03-19 MED ORDER — SODIUM CHLORIDE 0.9% FLUSH: 3.0000 mL | INTRAVENOUS | Status: DC | PRN

## 2019-03-19 MED ORDER — HEPARIN (PORCINE) IN NACL 1000-0.9 UT/500ML-% IV SOLN
INTRAVENOUS | Status: DC | PRN
  Administered 2019-03-19 (×2): 500 mL

## 2019-03-19 MED ORDER — MIDAZOLAM HCL 2 MG/2ML IJ SOLN
INTRAMUSCULAR | Status: DC | PRN
  Administered 2019-03-19: 2 mg via INTRAVENOUS

## 2019-03-19 MED ORDER — OXYCODONE HCL 5 MG PO TABS: 5.0000 mg | ORAL_TABLET | ORAL | Status: DC | PRN

## 2019-03-19 MED ORDER — SODIUM CHLORIDE 0.9% FLUSH: 3.0000 mL | Freq: Two times a day (BID) | INTRAVENOUS | Status: DC

## 2019-03-19 MED ORDER — MIDAZOLAM HCL (PF) 5 MG/ML IJ SOLN
INTRAMUSCULAR | Status: AC
  Filled 2019-03-19: qty 2

## 2019-03-19 MED ORDER — SODIUM CHLORIDE 0.9 % IV SOLN: 250.0000 mL | INTRAVENOUS | Status: DC | PRN

## 2019-03-19 MED ORDER — ONDANSETRON HCL 4 MG/2ML IJ SOLN: 4.0000 mg | Freq: Four times a day (QID) | INTRAMUSCULAR | Status: DC | PRN

## 2019-03-19 MED ORDER — CEFAZOLIN SODIUM-DEXTROSE 2-4 GM/100ML-% IV SOLN
INTRAVENOUS | Status: AC
  Filled 2019-03-19: qty 100

## 2019-03-19 MED ORDER — MIDAZOLAM HCL (PF) 10 MG/2ML IJ SOLN
INTRAMUSCULAR | Status: DC | PRN
  Administered 2019-03-19 (×2): 2 mg via INTRAVENOUS
  Administered 2019-03-19: 1 mg via INTRAVENOUS

## 2019-03-19 MED ORDER — HEPARIN SODIUM (PORCINE) 1000 UNIT/ML IJ SOLN
INTRAMUSCULAR | Status: AC
  Filled 2019-03-19: qty 1

## 2019-03-19 MED ORDER — ASPIRIN EC 81 MG PO TBEC: 81.0000 mg | DELAYED_RELEASE_TABLET | Freq: Every day | ORAL | 2 refills | Status: DC

## 2019-03-19 MED ORDER — LIDOCAINE HCL (PF) 1 % IJ SOLN
INTRAMUSCULAR | Status: AC
  Filled 2019-03-19: qty 30

## 2019-03-19 MED ORDER — SODIUM CHLORIDE 0.9 % WEIGHT BASED INFUSION: 3.0000 mL/kg/h | INTRAVENOUS | Status: AC

## 2019-03-19 MED ORDER — FENTANYL CITRATE (PF) 100 MCG/2ML IJ SOLN
INTRAMUSCULAR | Status: AC
  Filled 2019-03-19: qty 2

## 2019-03-19 MED ORDER — ALUM & MAG HYDROXIDE-SIMETH 200-200-20 MG/5ML PO SUSP
15.0000 mL | ORAL | Status: AC
  Administered 2019-03-19: 15 mL via ORAL
  Filled 2019-03-19: qty 30

## 2019-03-19 MED ORDER — SODIUM CHLORIDE 0.9 % IV SOLN: INTRAVENOUS | Status: DC

## 2019-03-19 MED ORDER — CLOPIDOGREL BISULFATE 75 MG PO TABS: 75.0000 mg | ORAL_TABLET | ORAL | Status: DC

## 2019-03-19 MED ORDER — SODIUM CHLORIDE 0.9 % WEIGHT BASED INFUSION: 1.0000 mL/kg/h | INTRAVENOUS | Status: DC

## 2019-03-19 MED ORDER — DIPHENHYDRAMINE HCL 50 MG/ML IJ SOLN
INTRAMUSCULAR | Status: AC
  Filled 2019-03-19: qty 1

## 2019-03-19 MED ORDER — SODIUM CHLORIDE 0.9 % IV SOLN
INTRAVENOUS | Status: AC | PRN
  Administered 2019-03-19: 10 mL/h via INTRAVENOUS

## 2019-03-19 MED ORDER — HEPARIN SODIUM (PORCINE) 1000 UNIT/ML IJ SOLN
INTRAMUSCULAR | Status: DC | PRN
  Administered 2019-03-19: 8000 [IU] via INTRAVENOUS

## 2019-03-19 MED ORDER — MIDAZOLAM HCL 2 MG/2ML IJ SOLN
INTRAMUSCULAR | Status: AC
  Filled 2019-03-19: qty 2

## 2019-03-19 MED ORDER — LABETALOL HCL 5 MG/ML IV SOLN: 10.0000 mg | INTRAVENOUS | Status: DC | PRN

## 2019-03-19 MED ORDER — BUTAMBEN-TETRACAINE-BENZOCAINE 2-2-14 % EX AERO
INHALATION_SPRAY | CUTANEOUS | Status: DC | PRN
  Administered 2019-03-19: 2 via TOPICAL

## 2019-03-19 MED ORDER — FENTANYL CITRATE (PF) 100 MCG/2ML IJ SOLN
INTRAMUSCULAR | Status: DC | PRN
  Administered 2019-03-19: 25 ug via INTRAVENOUS

## 2019-03-19 MED ORDER — ASPIRIN 81 MG PO CHEW: 81.0000 mg | CHEWABLE_TABLET | ORAL | Status: DC

## 2019-03-19 MED ORDER — LIDOCAINE HCL (PF) 1 % IJ SOLN
INTRAMUSCULAR | Status: DC | PRN
  Administered 2019-03-19: 15 mL

## 2019-03-19 MED ORDER — ACETAMINOPHEN 325 MG PO TABS: 650.0000 mg | ORAL_TABLET | ORAL | Status: DC | PRN

## 2019-03-19 MED ORDER — HEPARIN (PORCINE) IN NACL 1000-0.9 UT/500ML-% IV SOLN
INTRAVENOUS | Status: AC
  Filled 2019-03-19: qty 1000

## 2019-03-19 MED ORDER — CEFAZOLIN SODIUM-DEXTROSE 2-4 GM/100ML-% IV SOLN
2.0000 g | INTRAVENOUS | Status: AC
  Administered 2019-03-19: 2 g via INTRAVENOUS

## 2019-03-19 MED ORDER — FENTANYL CITRATE (PF) 100 MCG/2ML IJ SOLN
INTRAMUSCULAR | Status: DC | PRN
  Administered 2019-03-19 (×3): 25 ug via INTRAVENOUS

## 2019-03-19 MED ORDER — HYDRALAZINE HCL 20 MG/ML IJ SOLN: 10.0000 mg | INTRAMUSCULAR | Status: DC | PRN

## 2019-03-19 SURGICAL SUPPLY — 16 items
CATH ACUNAV REPROCESSED (CATHETERS) ×2 IMPLANT
CATH SUPER TORQUE PLUS 6F MPA1 (CATHETERS) ×2 IMPLANT
COVER SWIFTLINK CONNECTOR (BAG) ×2 IMPLANT
GUIDEWIRE AMPLATZER 1.5JX260 (WIRE) ×2 IMPLANT
GUIDEWIRE ANGLED .035X260CM (WIRE) ×2 IMPLANT
HOVERMATT SINGLE USE (MISCELLANEOUS) ×2 IMPLANT
OCCLUDER AMPLATZER PFO 18MM (Prosthesis & Implant Heart) ×2 IMPLANT
PACK CARDIAC CATHETERIZATION (CUSTOM PROCEDURE TRAY) ×2 IMPLANT
PROTECTION STATION PRESSURIZED (MISCELLANEOUS) ×2
SHEATH INTROD W/O MIN 9FR 25CM (SHEATH) ×2 IMPLANT
SHEATH PINNACLE 8F 10CM (SHEATH) ×2 IMPLANT
SHEATH PROBE COVER 6X72 (BAG) ×2 IMPLANT
STATION PROTECTION PRESSURIZED (MISCELLANEOUS) ×1 IMPLANT
SYS DELIVER AMP TREVISIO 8FR (SHEATH) ×2
SYSTEM DELIVER AMP TREVIS 8FR (SHEATH) ×1 IMPLANT
WIRE EMERALD 3MM-J .035X150CM (WIRE) ×2 IMPLANT

## 2019-03-19 NOTE — Progress Notes (Addendum)
C/O heartburn, pt states when he eats roughage like lettuce and cucumbers he usually gets heartburn/ sandwhich had lettuce. States he can not belch while laying down, groin was held while he leaned forward with some relief gained.  Pt requests Tums or Rolaids.  Candace PA/ cardiology was paged/ informed of heartburn, orders followed.  1958 pt states as soon as he took the maalox the pressure and discomfort was gone.

## 2019-03-19 NOTE — Interval H&P Note (Signed)
History and Physical Interval Note:  03/19/2019 2:09 PM  Zachary Powers  has presented today for surgery, with the diagnosis of PFO.  The various methods of treatment have been discussed with the patient and family. After consideration of risks, benefits and other options for treatment, the patient has consented to  Procedure(s): PATENT FORAMEN OVALE (PFO) CLOSURE (N/A) as a surgical intervention.  The patient's history has been reviewed, patient examined, no change in status, stable for surgery.  I have reviewed the patient's chart and labs.  Questions were answered to the patient's satisfaction.  Transesophageal echo study is reviewed and confirms a PFO with anatomic characteristics suitable for transcatheter closure.  We will proceed as planned.   Sherren Mocha

## 2019-03-19 NOTE — Progress Notes (Signed)
Pts rt groin dressing became saturated while packing pt up to transport to short stay.  Manual pressure help and hemostasis achieved to rt groin. Dressing was reapplied to rt groin. Pt transported to short stay in stable condition.Marland Kitchen

## 2019-03-19 NOTE — Progress Notes (Signed)
Pharmacy called x 2 for Maalox, pt's heartburn continues.

## 2019-03-19 NOTE — CV Procedure (Signed)
   Transesophageal Echocardiogram  Indications: PFO  Time out performed  During this procedure the patient is administered a total of Versed 2 mg and Fentanyl 50 mcg to achieve and maintain moderate conscious sedation.  The patient's heart rate, blood pressure, and oxygen saturation are monitored continuously during the procedure. The period of conscious sedation is 20 minutes, of which I was present face-to-face 100% of this time.  Findings:  Left Ventricle: Normal EF 55%  Mitral Valve: Normal, mild MR  Aortic Valve: Normal, no AI, no left ear  Tricuspid Valve: Normal, trivial TR  Left Atrium: No left atrial appendage thrombus  Bubble Contrast Study: Positive for PFO, early bubble crossover.  Few bubbles crossed with and without Valsalva.  Redundant interatrial septum noted.  Impression: PFO positive.  Discussed with Dr. Burt Knack.  Candee Furbish, MD

## 2019-03-19 NOTE — Progress Notes (Signed)
  Echocardiogram Echocardiogram Transesophageal has been performed.  Zachary Powers 03/19/2019, 1:39 PM

## 2019-03-19 NOTE — Progress Notes (Signed)
  Echocardiogram 2D Echocardiogram has been performed.  Zachary Powers 03/19/2019, 4:28 PM

## 2019-03-19 NOTE — Progress Notes (Signed)
Site area: 7fr rt fem venous and 35fr long fem venous sheaths Site Prior to Removal:  Level 0 Pressure Applied For: 35 min Manual:   yes Patient Status During Pull:  A/O Post Pull Site:  Level 0 Post Pull Instructions Given:  yes Post Pull Pulses Present: 2+ rt dp/pt Dressing Applied:  tegaderm and a 4x4 Bedrest begins @ 17:20:00 Comments: Pt leaves cath lab holding in stable condition. Rt groin is unremarkable. No bruising or hematoma. Rt groin dressing is unremarkable.

## 2019-03-19 NOTE — Discharge Instructions (Signed)
Groin site Care After Patent Foramen Ovale Closure Shower in 24 hours/ no soaking for 5 days Any bleeding from site, lay flat hold pressure 20 minutes, if not resolved hold 20 more minutes or call for assistance.   This sheet gives you information about how to care for yourself after your procedure. Your health care provider may also give you more specific instructions. If you have problems or questions, contact your health care provider. What can I expect after the procedure? After the procedure, it is common to have:  Bruising or mild discomfort in the area where the IV was inserted (insertion site). Follow these instructions at home: Eating and drinking   Follow instructions from your health care provider about eating or drinking restrictions.  Drink a lot of fluids for the first several days after the procedure, as directed by your health care provider. This helps to wash (flush) the contrast out of your body. Examples of healthy fluids include water or low-calorie drinks. General instructions  Check your IV insertion area every day for signs of infection. Check for: ? Redness, swelling, or pain. ? Fluid or blood. ? Warmth. ? Pus or a bad smell.  Take over-the-counter and prescription medicines only as told by your health care provider.  Rest and return to your normal activities as told by your health care provider. Ask your health care provider what activities are safe for you.  Do not drive for 24 hours if you were given a medicine to help you relax (sedative), or until your health care provider approves.  Keep all follow-up visits as told by your health care provider. This is important. Contact a health care provider if:  Your skin becomes itchy or you develop a rash or hives.  You have a fever that does not get better with medicine.  You feel nauseous.  You vomit.  You have redness, swelling, or pain around the insertion site.  You have fluid or blood coming from  the insertion site.  Your insertion area feels warm to the touch.  You have pus or a bad smell coming from the insertion site. Get help right away if:  You have difficulty breathing or shortness of breath.  You develop chest pain.  You faint.  You feel very dizzy. These symptoms may represent a serious problem that is an emergency. Do not wait to see if the symptoms will go away. Get medical help right away. Call your local emergency services (911 in the [place] Do not drive yourself to the hospital. Summary  After your procedure, it is common to have bruising or mild discomfort in the area where the IV was inserted.  You should check your IV insertion area every day for signs of infection.  Take over-the-counter and prescription medicines only as told by your health care provider.  You should drink a lot of fluids for the first several days after the procedure to help flush the contrast from your body. This information is not intended to replace advice given to you by your health care provider. Make sure you discuss any questions you have with your health care provider. Document Released: 05/07/2013 Document Revised: 06/29/2017 Document Reviewed: 06/10/2016 Elsevier Patient Education  [place] Foramen Ovale Closure, Adult, Care After This sheet gives you information about how to care for yourself after your procedure. Your health care provider may also give you more specific instructions. If you have problems or questions, contact your health care provider. What can I expect  after the procedure? After the procedure, it is common to have:  Bruising.  Tenderness in the area where the long, thin tube was inserted into your inner thigh or groin area (catheter insertion site). Follow these instructions at home: Catheter insertion site care   Follow instructions from your health care provider about how to take care of your incision or puncture. Make sure  you: ? Wash your hands with soap and water before you change your bandage (dressing). If soap and water are not available, use hand sanitizer. ? Change your dressing as told by your health care provider. ? Leave stitches (sutures), skin glue, or adhesive strips in place. These skin closures may need to stay in place for 2 weeks or longer. If adhesive strip edges start to loosen and curl up, you may trim the loose edges. Do not remove adhesive strips completely unless your health care provider tells you to do that.  Check your catheter insertion site every day for signs of infection. Check for: ? Redness, swelling, or pain. ? Fluid or blood. ? Warmth. ? Pus or a bad smell. ? A lump or bump. Activity  Do not lift anything that is heavier than 10 lb (4.5 kg), for 0ne week  Return to your normal activities as told by your health care provider. Ask your health care provider what activities are safe for you.  Do not drive until your health care provider approves. Medicines  Take over-the-counter and prescription medicines only as told by your health care provider.  You may need to take medicines to prevent blood clots for 6 months or longer. You may have to take aspirin and clopidogrel. Clopidogrel is a blood thinner (anticoagulant) that helps to prevent heart attacks and strokes. Lifestyle  Avoid drinking alcohol.  Do not use any products that contain nicotine or tobacco, such as cigarettes and e-cigarettes. If you need help quitting, ask your health care provider. General instructions  Drink enough fluid to keep your urine clear or pale yellow. This helps to get rid of the dye that was used during the procedure.  Tell all health care providers and dental care providers who care for you that you had a patent foramen ovale closure. Do this before having any type of test or surgery.  Ask your health care provider if you need to take antibiotic medicine before dental procedures and  surgeries. This may be necessary to prevent infection.  While taking anticoagulants: ? Prevent falls by removing loose rugs and extension cords from areas where you walk. ? Be very careful when using knives, scissors, or other sharp objects. ? Do not play contact sports or participate in other activities that have a high risk of injury.  Do not take baths, swim, or use a hot tub until your health care provider approves.  Keep all follow-up visits as told by your health care provider. This is important. Contact a health care provider if:  You have a fever.  You have pain that does not get better with medicine.  You have fluid or blood coming from your catheter insertion site.  You have a hard lump or bump at your catheter insertion site. Get help right away if:  You have trouble breathing.  You have chest pain.  You have redness, swelling, or pain around your catheter insertion site.  Your catheter insertion site feels warm to the touch.  You have pus or a bad smell coming from your catheter insertion site.  You have severe pain  in your arm or jaw. Summary  After the procedure, it is common for you to have some bruising and tenderness where a tube was inserted into your inner thigh or groin area (catheter insertion site).  Check your catheter insertion site every day for signs of infection, such as redness, swelling, or pain.  Before any procedure or test, tell all health care providers and dental providers who care for you that you had a patent foramen ovale closure. This information is not intended to replace advice given to you by your health care provider. Make sure you discuss any questions you have with your health care provider. Document Released: 08/17/2016 Document Revised: 11/12/2018 Document Reviewed: 08/17/2016 Elsevier Patient Education  2020 [employer].

## 2019-03-19 NOTE — Interval H&P Note (Signed)
History and Physical Interval Note:  03/19/2019 12:38 PM  Zachary Powers  has presented today for surgery, with the diagnosis of PFO.  The various methods of treatment have been discussed with the patient and family. After consideration of risks, benefits and other options for treatment, the patient has consented to  Procedure(s): TRANSESOPHAGEAL ECHOCARDIOGRAM (TEE) (N/A) as a surgical intervention.  The patient's history has been reviewed, patient examined, no change in status, stable for surgery.  I have reviewed the patient's chart and labs.  Questions were answered to the patient's satisfaction.     [employer]

## 2019-03-20 ENCOUNTER — Encounter (HOSPITAL_COMMUNITY): Payer: Self-pay | Admitting: Cardiovascular Disease

## 2019-03-26 ENCOUNTER — Institutional Professional Consult (permissible substitution): Payer: Medicaid Other | Admitting: Cardiovascular Disease

## 2019-04-23 ENCOUNTER — Ambulatory Visit: Payer: Medicaid Other | Admitting: Physician Assistant

## 2019-04-24 ENCOUNTER — Other Ambulatory Visit: Payer: Self-pay

## 2019-04-24 ENCOUNTER — Telehealth (INDEPENDENT_AMBULATORY_CARE_PROVIDER_SITE_OTHER): Payer: Medicaid Other | Admitting: Physician Assistant

## 2019-04-24 VITALS — Ht 69.5 in

## 2019-04-24 DIAGNOSIS — Q211 Atrial septal defect: Secondary | ICD-10-CM

## 2019-04-24 DIAGNOSIS — Q2112 Patent foramen ovale: Secondary | ICD-10-CM

## 2019-04-24 NOTE — Patient Instructions (Signed)
Medication Instructions:  1) STOP PLAVIX 06/16/2019  Follow-Up: You will be called to arrange your 1 year echo and office visit!

## 2019-04-24 NOTE — Progress Notes (Signed)
[facility]   MULTIDISCIPLINARY HEART VALVE TEAM  Virtual Visit via Video Note   This visit type was conducted due to national recommendations for restrictions regarding the COVID-19 Pandemic (e.g. social distancing) in an effort to limit this patient's exposure and mitigate transmission in our community.  Due to his co-morbid illnesses, this patient is at least at moderate risk for complications without adequate follow up.  This format is felt to be most appropriate for this patient at this time.  All issues noted in this document were discussed and addressed.  A limited physical exam was performed with this format.  Please refer to the patient's chart for his consent to telehealth for [facility].   Evaluation Performed:  Follow-up visit  Date:  04/24/2019   ID:  Zachary Powers, DOB 01/01/1966, MRN [id number]  Patient Location: Home Provider Location: Office  PCP:  Nolene Ebbs, MD  Cardiologist: Dr. Burt Knack  Chief Complaint:  1 month s/p PFO closure   History of Present Illness:    Zachary Powers is a 53 y.o. male with a history of HTN, intracranial stenosis, cryptogenic CVA, and PFO s/p PFO closure who presents for follow up.   He presented with blurry/double vision, dizziness, and gait instability in 01/2019. Symptoms improved over several hours.  Neuro imaging demonstrated small right parietal cortical infarcts considered etiologies included large vessel disease versus PFO. Formal neurologic evaluation and stroke work-up was completed in the hospital. This included a 2D echocardiogram demonstrated normal LV systolic function, no valvular disease, and no clear cardiac source of embolus.  Lower extremity venous Doppler was negative.  Hypercoagulable panel was negative.  The patient was found to have intracranial stenoses present.  A transcranial Doppler study demonstrated Spencer grade 3 shunt with Valsalva. He was referred to Dr. Burt Knack for consideration of transcatheter  PFO closure.  TEE 03/19/19 confirmed PFO and he underwent successful transcatheter PFO closure using an 18 mm Amplatzer PFO occluder. Post op echo showed EF 55-60% s/p PFO closure with no residual flow. He was placed on aspirin and plavix to be continued at least 3 months.   Today he presents for follow up. The patient does not have symptoms concerning for COVID-19 infection (fever, chills, cough, or new shortness of breath). No CP or SOB. No LE edema, orthopnea or PND. No dizziness or syncope. No blood in stool or urine. No palpitations. He is currently at work. No new neurologic symptoms.    Past Medical History:  Diagnosis Date  . Anxiety   . Asthma   . Back pain   . Cryptogenic stroke ([place])   . Depression   . GERD (gastroesophageal reflux disease)   . Hypertension   . PFO (patent foramen ovale)    Past Surgical History:  Procedure Laterality Date  . BUBBLE STUDY  03/19/2019   Procedure: BUBBLE STUDY;  Surgeon: Jerline Pain, MD;  Location: [facility] ENDOSCOPY;  Service: Cardiovascular;;  . HAND SURGERY Left 1991  . KNEE ARTHROSCOPY Left 04/10/2018   Procedure: Left knee arthroscopy, debridement, chondroplasty patella, possible menisectomy;  Surgeon: Susa Day, MD;  Location: [facility] ORS;  Service: Orthopedics;  Laterality: Left;  2 hrs for both procedures  . PATENT FORAMEN OVALE(PFO) CLOSURE N/A 03/19/2019   Procedure: PATENT FORAMEN OVALE (PFO) CLOSURE;  Surgeon: Sherren Mocha, MD;  Location: [facility] LAB;  Service: Cardiovascular;  Laterality: N/A;  . QUADRICEPS TENDON REPAIR Left 04/10/2018   Procedure: Mini open quad tendon repair;  Surgeon: Susa Day,  MD;  Location: [facility] ORS;  Service: Orthopedics;  Laterality: Left;  . TEE WITHOUT CARDIOVERSION N/A 03/19/2019   Procedure: TRANSESOPHAGEAL ECHOCARDIOGRAM (TEE);  Surgeon: Jerline Pain, MD;  Location: [facility] ENDOSCOPY;  Service: Cardiovascular;  Laterality: N/A;     Current Meds  Medication Sig  . aspirin EC 325 MG tablet Take  325 mg by mouth daily.  Marland Kitchen BLACK CURRANT SEED OIL PO Take 1 capsule by mouth daily.  . clopidogrel (PLAVIX) 75 MG tablet Take 75 mg by mouth daily.  . diclofenac (VOLTAREN) 75 MG EC tablet Take 150 mg by mouth daily.  . Flaxseed, Linseed, (FLAX SEED OIL PO) Take 1 capsule by mouth daily.  . hydrochlorothiazide (HYDRODIURIL) 25 MG tablet Take 25 mg by mouth daily.  Marland Kitchen losartan (COZAAR) 100 MG tablet Take 100 mg by mouth daily.  . Multiple Vitamin (MULTIVITAMIN WITH MINERALS) TABS tablet Take 1 tablet by mouth daily.  . nicotine (NICODERM CQ - DOSED IN MG/24 HOURS) 14 mg/24hr patch Place 1 patch (14 mg total) onto the skin daily.  Marland Kitchen zolpidem (AMBIEN) 10 MG tablet Take 10 mg by mouth at bedtime as needed for sleep.     Allergies:   Barbiturates   Social History   Tobacco Use  . Smoking status: Current Every Day Smoker    Packs/day: 0.50    Types: Cigarettes  . Smokeless tobacco: Never Used  Substance Use Topics  . Alcohol use: No    Alcohol/week: 0.0 standard drinks  . Drug use: No    Comment: no use in 6 years     Family Hx: The patient's family history includes Diabetes in his father and paternal grandmother; Heart disease in his father; Stroke in his mother. There is no history of Colon cancer.  ROS:   Please see the history of present illness.    All other systems reviewed and are negative.   Prior CV studies:   The following studies were reviewed today:  TEE 03/19/19 IMPRESSIONS  1. The left ventricle has normal systolic function, with an ejection fraction of 55-60%. The cavity size was normal. No evidence of left ventricular regional wall motion abnormalities.  2. The right ventricle has normal systolc function. The cavity was normal. There is no increase in right ventricular wall thickness.  3. No evidence of a thrombus present in the left atrial appendage.  4. The aortic root is normal in size and structure.  5. Small amount bubble contrast cross over right to left  consistent with small PFO.  6. No intracardiac thrombi or masses were visualized.  ________________  03/19/19 PATENT FORAMEN OVALE (PFO) CLOSURE  Conclusion Successful transcatheter PFO closure using an 18 mm Amplatzer PFO occluder with intracardiac echo and fluoroscopic guidance  Recommendations  Antiplatelet/Anticoag Recommend uninterrupted dual antiplatelet therapy with Aspirin 81mg  daily and Clopidogrel 75mg  daily for 3 months.   ________________   03/19/19 TTE IMPRESSIONS  1. The left ventricle has normal systolic function, with an ejection fraction of 55-60%. The cavity size was normal. There is mildly increased left ventricular wall thickness. Left ventricular diastolic parameters were normal.  2. The right ventricle has normal systolc function. The cavity was normal.  3. Left atrial size was mildly dilated.  4. Post PFO closure no obvous residual flow no bubble study performed Device used was a 18 mm Amplatzer occluder.  5. Mild thickening of the mitral valve leaflet.  6. Mild thickening of the aortic valve Mild calcification of the aortic valve.  7. The aortic  root is normal in size and structure.  Labs/Other Tests and Data Reviewed:    EKG:  No ECG reviewed.  Recent Labs: 03/18/2019: BUN 12; Creatinine, Ser 0.94; Hemoglobin 16.2; Platelets 233; Potassium 3.9; Sodium 138   Recent Lipid Panel Lab Results  Component Value Date/Time   CHOL 193 01/28/2019 05:55 AM   TRIG 144 01/28/2019 05:55 AM   HDL 34 (L) 01/28/2019 05:55 AM   CHOLHDL 5.7 01/28/2019 05:55 AM   LDLCALC 130 (H) 01/28/2019 05:55 AM    Wt Readings from Last 3 Encounters:  03/19/19 210 lb (95.3 kg)  03/13/19 212 lb (96.2 kg)  02/21/19 215 lb 6.4 oz (97.7 kg)     Objective:    Vital Signs:  Ht 5' 9.5" (1.765 m)   BMI 30.57 kg/m    ASSESSMENT & PLAN:    PFO s/p PFO closure: doing well. Groin site healing well. Continue aspirin and plavix. He can stop plavix after 3 months (06/16/19). He  understands the need for SBE prophylaxis x 6 months. I will see him back in 1 year with echo with bubble study and follow up.   COVID-19 Education: The signs and symptoms of COVID-19 were discussed with the patient and how to seek care for testing (follow up with PCP or arrange E-visit).  The importance of social distancing was discussed today.  Time:   Today, I have spent 15 minutes with the patient with telehealth technology discussing the above problems.     Medication Adjustments/Labs and Tests Ordered: Current medicines are reviewed at length with the patient today.  Concerns regarding medicines are outlined above.   Tests Ordered: Orders Placed This Encounter  Procedures  . ECHOCARDIOGRAM LIMITED BUBBLE STUDY    Medication Changes: No orders of the defined types were placed in this encounter.   Disposition:  Follow up in 1 year(s)  Signed, Angelena Form, PA-C  04/24/2019 3:47 PM    [facility]

## 2019-07-14 ENCOUNTER — Encounter: Payer: Self-pay | Admitting: Adult Health

## 2019-07-14 ENCOUNTER — Other Ambulatory Visit: Payer: Self-pay

## 2019-07-14 ENCOUNTER — Ambulatory Visit: Payer: Medicaid Other | Admitting: Adult Health

## 2019-07-14 VITALS — BP 128/86 | HR 74 | Temp 97.2°F | Ht 69.0 in | Wt 223.8 lb

## 2019-07-14 DIAGNOSIS — I10 Essential (primary) hypertension: Secondary | ICD-10-CM | POA: Diagnosis not present

## 2019-07-14 DIAGNOSIS — Q2112 Patent foramen ovale: Secondary | ICD-10-CM

## 2019-07-14 DIAGNOSIS — Q211 Atrial septal defect: Secondary | ICD-10-CM

## 2019-07-14 DIAGNOSIS — I679 Cerebrovascular disease, unspecified: Secondary | ICD-10-CM | POA: Diagnosis not present

## 2019-07-14 DIAGNOSIS — I6389 Other cerebral infarction: Secondary | ICD-10-CM

## 2019-07-14 DIAGNOSIS — E785 Hyperlipidemia, unspecified: Secondary | ICD-10-CM

## 2019-07-14 NOTE — Progress Notes (Signed)
[facility] [address] [place]. [place] [address] [phone]       OFFICE FOLLOW UP NOTE  Mr. Zachary Powers Date of Birth:  Mar 10, 1966 Medical Record Number:  [id number]   Reason for Referral: stroke follow up    CHIEF COMPLAINT:  Chief Complaint  Patient presents with  . Follow-up    Tx room, alone. States he is doing well. No concerns.    HPI: Stroke admission 01/27/2019: Mr. Zachary Powers is a 53 y.o. 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 HA following symptom resolution.  Neurology consulted with stroke work-up revealing tiny right parietal cortical infarcts within right parietal lobe and right cingulate gyrus chronic infarct (new since 2015 imaging) as evidenced on MRI.  CTA head/neck showed bilateral ICA high-grade stenosis versus occlusion, bilateral ACA territory moyamoya-like presentation, and dominant left VA P2 high-grade stenosis. 2D echo normal EF without cardiac source of embolus or PFO.  Lower extremity venous Dopplers negative for DVT TCD bubble study showed Spencer III PFO on Valsalva only.  Hypercoagulable panel negative.  Due to intracranial vascular stenosis, recommended DAPT for 3 months and aspirin alone.  ROPE score 4 and recommended follow-up with Dr. Burt Knack outpatient for possible PFO closure.  HTN stable.  LDL 130 initiated atorvastatin 80 mg daily.  Current tobacco user smoking cessation counseling provided.  Other stroke risk factors include history of EtOH use, history of cocaine use, obesity, family history of stroke and history of silent stroke on imaging likely secondary to bilateral ICA high-grade stenosis versus occlusion.  Discharged home in stable condition without therapy needs.  Initial visit 03/13/2019: Overall stable  from a stroke standpoint without residual deficits He does endorse intermittent diplopia for about 4 weeks after admission but has since resolved.  Denies any other associated symptoms or headaches at that time. He also endorses approximately 1 week ago, he was experiencing intermittent right-sided upper lip "stiffness" that he would feel as he was speaking.  This lasted for back to me 4 days and then subsided.  He denies any other associated symptoms such as facial droop, weakness, numbness/tingling, vision changes or speech difficulty. He did have follow-up with Dr. Burt Knack with plans on undergoing TEE with possible PFO closure on 03/19/2019 Continues on DAPT aspirin 325 mg and clopidogrel 75 mg without bleeding or bruising Continues on atorvastatin without myalgias -does state he had recent lab work by PCP will level satisfactory (unable to view results) Blood pressure stable at 128/80 Tobacco use approximately 1 to 2 cigarettes/day which is decreased from prior now at 1 pack/day.  He plans on continuing to decrease amount until completely quits No further concerns at this time  Update 07/14/2019: Zachary Powers is a 53 year old male who is being seen today for stroke follow-up.  He has been stable from a stroke standpoint.  He did undergo TEE and PFO closure by Dr. Burt Knack on [id number] without complication.  Continues on aspirin and atorvastatin for secondary stroke prevention without side effects.  Blood pressure today 128/86.  Denies new or worsening stroke/TIA symptoms.    ROS:   14 system review of systems performed and negative with exception of no concerns  PMH:  Past Medical History:  Diagnosis Date  . Anxiety   . Asthma   .  Back pain   . Cryptogenic stroke ([place])   . Depression   . GERD (gastroesophageal reflux disease)   . Hypertension   . PFO (patent foramen ovale)     PSH:  Past Surgical History:  Procedure Laterality Date  . BUBBLE STUDY  03/19/2019   Procedure: BUBBLE STUDY;   Surgeon: Jerline Pain, MD;  Location: [facility] ENDOSCOPY;  Service: Cardiovascular;;  . HAND SURGERY Left 1991  . KNEE ARTHROSCOPY Left 04/10/2018   Procedure: Left knee arthroscopy, debridement, chondroplasty patella, possible menisectomy;  Surgeon: Susa Day, MD;  Location: [facility] ORS;  Service: Orthopedics;  Laterality: Left;  2 hrs for both procedures  . PATENT FORAMEN OVALE(PFO) CLOSURE N/A 03/19/2019   Procedure: PATENT FORAMEN OVALE (PFO) CLOSURE;  Surgeon: Sherren Mocha, MD;  Location: [facility] LAB;  Service: Cardiovascular;  Laterality: N/A;  . QUADRICEPS TENDON REPAIR Left 04/10/2018   Procedure: Mini open quad tendon repair;  Surgeon: Susa Day, MD;  Location: [facility] ORS;  Service: Orthopedics;  Laterality: Left;  . TEE WITHOUT CARDIOVERSION N/A 03/19/2019   Procedure: TRANSESOPHAGEAL ECHOCARDIOGRAM (TEE);  Surgeon: Jerline Pain, MD;  Location: [facility] ENDOSCOPY;  Service: Cardiovascular;  Laterality: N/A;    Social History:  Social History   Socioeconomic History  . Marital status: Single    Spouse name: Not on file  . Number of children: Not on file  . Years of education: Not on file  . Highest education level: Not on file  Occupational History  . Not on file  Tobacco Use  . Smoking status: Current Every Day Smoker    Packs/day: 0.50    Types: Cigarettes  . Smokeless tobacco: Never Used  Substance and Sexual Activity  . Alcohol use: No    Alcohol/week: 0.0 standard drinks  . Drug use: No    Comment: no use in 6 years  . Sexual activity: Yes  Other Topics Concern  . Not on file  Social History Narrative  . Not on file   Social Determinants of Health   Financial Resource Strain:   . Difficulty of Paying Living Expenses: Not on file  Food Insecurity:   . Worried About Charity fundraiser in the Last Year: Not on file  . Ran Out of Food in the Last Year: Not on file  Transportation Needs:   . Lack of Transportation (Medical): Not on file  . Lack of  Transportation (Non-Medical): Not on file  Physical Activity:   . Days of Exercise per Week: Not on file  . Minutes of Exercise per Session: Not on file  Stress:   . Feeling of Stress : Not on file  Social Connections:   . Frequency of Communication with Friends and Family: Not on file  . Frequency of Social Gatherings with Friends and Family: Not on file  . Attends Religious Services: Not on file  . Active Member of Clubs or Organizations: Not on file  . Attends Archivist Meetings: Not on file  . Marital Status: Not on file  Intimate Partner Violence:   . Fear of Current or Ex-Partner: Not on file  . Emotionally Abused: Not on file  . Physically Abused: Not on file  . Sexually Abused: Not on file    Family History:  Family History  Problem Relation Age of Onset  . Diabetes Father   . Heart disease Father   . Diabetes Paternal Grandmother   . Stroke Mother   . Colon cancer Neg Hx  Medications:   Current Outpatient Medications on File Prior to Visit  Medication Sig Dispense Refill  . aspirin EC 325 MG tablet Take 325 mg by mouth daily.    Marland Kitchen BLACK CURRANT SEED OIL PO Take 1 capsule by mouth daily.    . diclofenac (VOLTAREN) 75 MG EC tablet Take 150 mg by mouth daily.    . Flaxseed, Linseed, (FLAX SEED OIL PO) Take 1 capsule by mouth daily.    . hydrochlorothiazide (HYDRODIURIL) 25 MG tablet Take 25 mg by mouth daily.    Marland Kitchen losartan (COZAAR) 100 MG tablet Take 100 mg by mouth daily.    . Multiple Vitamin (MULTIVITAMIN WITH MINERALS) TABS tablet Take 1 tablet by mouth daily.    . nicotine (NICODERM CQ - DOSED IN MG/24 HOURS) 14 mg/24hr patch Place 1 patch (14 mg total) onto the skin daily. 28 patch 0  . zolpidem (AMBIEN) 10 MG tablet Take 10 mg by mouth at bedtime as needed for sleep.    Marland Kitchen atorvastatin (LIPITOR) 80 MG tablet Take 1 tablet (80 mg total) by mouth daily at 6 PM. 30 tablet 1   No current facility-administered medications on file prior to visit.     Allergies:   Allergies  Allergen Reactions  . Barbiturates     Recovering addict     Physical Exam  Vitals:   07/14/19 1540  BP: 128/86  Pulse: 74  Temp: (!) 97.2 F (36.2 C)  Weight: 223 lb 12.8 oz (101.5 kg)  Height: 5\' 9"  (1.753 m)   Body mass index is 33.05 kg/m. No exam data present  General: well developed, well nourished,  pleasant middle-aged African-American male, seated, in no evident distress Head: head normocephalic and atraumatic.   Neck: supple with no carotid or supraclavicular bruits Cardiovascular: regular rate and rhythm, no murmurs Musculoskeletal: no deformity Skin:  no rash/petichiae Vascular:  Normal pulses all extremities   Neurologic Exam Mental Status: Awake and fully alert. Oriented to place and time. Recent and remote memory intact. Attention span, concentration and fund of knowledge appropriate. Mood and affect appropriate.  Cranial Nerves: Pupils equal, briskly reactive to light. Extraocular movements full without nystagmus. Visual fields full to confrontation. Hearing intact. Facial sensation intact. Face, tongue, palate moves normally and symmetrically.  Motor: Normal bulk and tone. Normal strength in all tested extremity muscles. Sensory.: intact to touch , pinprick , position and vibratory sensation.  Coordination: Rapid alternating movements normal in all extremities. Finger-to-nose and heel-to-shin performed accurately bilaterally. Gait and Station: Arises from chair without difficulty. Stance is normal. Gait demonstrates normal stride length and balance Reflexes: 1+ and symmetric. Toes downgoing.      Diagnostic Data (Labs, Imaging, Testing)  CT HEAD WO CONTRAST 01/27/2019 IMPRESSION: No definite acute intracranial pathology. There is encephalomalacia of the anterior right corona radiata (series 3, image 22). There may be additional hypodensity of the inferior pons or medulla consistent with age indeterminate lacunar  infarction versus streak artifact from the skull base (series 3, image 9). Consider MRI to more sensitively evaluate for acute diffusion restricting infarction if indicated.  CT ANGIO HEAD W OR WO CONTRAST CT ANGIO NECK W OR WO CONTRAST 01/28/2019 CTA head: 1. Severe right A1 stenosis, left A1 occlusion, and severely attenuated/irregular downstream bilateral ACA distributions with poor collateralization. Intracranial circulation is otherwise unremarkable. Findings favor chronic sequelae vasculitis/vasculopathy and there is a faint Moyamoya pattern of collateralization at the [place] origins. IMPRESSION: CTA neck: 1. Left V2 segment moderate to severe stenosis and  left common carotid artery origin mild stenosis with fibrofatty plaque. No additional high-grade stenosis, aneurysm, dissection of the carotid or vertebral arteries. 2. Paraseptal emphysema of right lung apex.  MR BRAIN WO CONTRAST 01/27/2019 IMPRESSION: 1. Tiny cortical area of restricted diffusion in the posterior right parietal lobe might reflect acute ischemia. 2. However, there are unusual bilateral pericallosal signal changes, including encephalomalacia of some of the right cingulate gyrus, which are new since a 2015 MRI which was done for hearing loss. 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   ECHOCARDIOGRAM 01/28/2019 IMPRESSIONS  1. The left ventricle has normal systolic function with an ejection fraction of 60-65%. The cavity size was normal. There is moderately increased left ventricular wall thickness. Left ventricular diastolic Doppler parameters are consistent with impaired  relaxation. Elevated left atrial and left ventricular end-diastolic pressures The E/e' is >15. No evidence of left ventricular regional wall motion abnormalities.  2. The right ventricle has normal systolic function. The cavity was normal. There is no  increase in right ventricular wall thickness.  3. The mitral valve is grossly normal.  4. The tricuspid valve is grossly normal.  5. The aortic valve was not well visualized. No stenosis of the aortic valve.  6. The interatrial septum was not well visualized.  VAS [place] TRANSCRANIAL DOPPLERS WITH BUBBLES 01/29/2019 Summary:   A vascular evaluation was performed. The right middle cerebral artery was studied. An IV was inserted into the patient's left forearm. Verbal informed consent was obtained.   HITS heard at Valsalva Frederico Hamman III 0 HITS heard at rest. Dr. Erlinda Hong performed the test.  Positive TCD Bubble study indicative of a small right to left shunt with valsalva only *See table(s) above for measurements and observations.     ASSESSMENT: Zachary Powers is a 53 y.o. year old male presented with near syncope, couple spots in visual fields and headache on 01/27/2019 with stroke work-up revealing tiny right parietal cortical infarcts likely secondary to large vessel disease versus PFO. Vascular risk factors include HTN, HLD, history of substance abuse 8 years prior, intracranial vascular stenosis, PFO, history of stroke and tobacco use.  He underwent TEE with PFO closure on 03/19/2019 by Dr. Marlou Porch and Dr. Burt Knack.  He recovered well from a stroke standpoint without residual deficits.   PLAN:  1. Right parietal cortical infarcts: Continue aspirin 325 mg daily  and atorvastatin for secondary stroke prevention. Maintain strict control of hypertension with blood pressure goal below 130/90, diabetes with hemoglobin A1c goal below 6.5% and cholesterol with LDL cholesterol (bad cholesterol) goal below 70 mg/dL.  I also advised the patient to eat a healthy diet with plenty of whole grains, cereals, fruits and vegetables, exercise regularly with at least 30 minutes of continuous activity daily and maintain ideal body weight. 2. PFO s/p closure: Completed recommended DAPT duration and continues on aspirin and  statin.  Cardiology recommended 1 year follow-up for repeat echo with bubble study 3. HTN: Advised to continue current treatment regimen.  Today's BP stable.  Advised to continue to monitor at home along with continued follow-up with PCP for management 4. HLD: Advised to continue current treatment regimen along with continued follow-up with PCP for future prescribing and monitoring of lipid panel 5. Intracranial vascular stenosis: Continuation of aspirin and statin and discussion regarding importance of managing risk factors of HTN and HLD along with tobacco cessation.  Would recommend repeat imaging 1 year post stroke which can be obtained  by PCP    Stable from stroke standpoint recommend follow-up as needed   Greater than 50% of time during this 25 minute visit was spent on counseling, explanation of diagnosis of right parietal cortical infarcts, reviewing risk factor management of PFO, intracranial vascular stenosis, HTN, HLD and tobacco use, planning of further management along with potential future management, and discussion with patient answering all questions to satisfaction    Frann Rider, AGNP-BC  [facility] [address] [place], [place] [address]  Phone [phone] Fax [phone] Note: This document was prepared with digital dictation and possible smart phrase technology. Any transcriptional errors that result from this process are unintentional.

## 2019-07-14 NOTE — Patient Instructions (Signed)
Continue aspirin 325 mg daily  and Lipitor for secondary stroke prevention  Continue to follow up with PCP regarding cholesterol and blood pressure management  Follow-up with cardiology 1 year post PFO closure  Continue to monitor blood pressure at home  Maintain strict control of hypertension with blood pressure goal below 130/90, diabetes with hemoglobin A1c goal below 6.5% and cholesterol with LDL cholesterol (bad cholesterol) goal below 70 mg/dL. I also advised the patient to eat a healthy diet with plenty of whole grains, cereals, fruits and vegetables, exercise regularly and maintain ideal body weight.         Thank you for coming to see [place] at [facility]. I hope we have been able to provide you high quality care today.  You may receive a patient satisfaction survey over the next few weeks. We would appreciate your feedback and comments so that we may continue to improve ourselves and the health of our patients.

## 2019-07-15 NOTE — Progress Notes (Signed)
I agree with the above plan 

## 2020-03-15 ENCOUNTER — Telehealth: Payer: Self-pay

## 2020-03-15 NOTE — Telephone Encounter (Signed)
Due to provider illness, the patient understands to keep echo appointment 8/18 and will reschedule office visit to virtual visit 8/19. He will try to have HR, BP and weight ready for visit.  Consent obtained.    Patient Consent for Virtual Visit         Zachary Powers has provided verbal consent on 03/15/2020 for a virtual visit (video or telephone).   CONSENT FOR VIRTUAL VISIT FOR:  Zachary Powers  By participating in this virtual visit I agree to the following:  I hereby voluntarily request, consent and authorize [facility] and its employed or contracted physicians, physician assistants, nurse practitioners or other licensed health care professionals (the Practitioner), to provide me with telemedicine health care services (the "Services") as deemed necessary by the treating Practitioner. I acknowledge and consent to receive the Services by the Practitioner via telemedicine. I understand that the telemedicine visit will involve communicating with the Practitioner through live audiovisual communication technology and the disclosure of certain medical information by electronic transmission. I acknowledge that I have been given the opportunity to request an in-person assessment or other available alternative prior to the telemedicine visit and am voluntarily participating in the telemedicine visit.  I understand that I have the right to withhold or withdraw my consent to the use of telemedicine in the course of my care at any time, without affecting my right to future care or treatment, and that the Practitioner or I may terminate the telemedicine visit at any time. I understand that I have the right to inspect all information obtained and/or recorded in the course of the telemedicine visit and may receive copies of available information for a reasonable fee.  I understand that some of the potential risks of receiving the Services via telemedicine include:  Marland Kitchen Delay or interruption in medical  evaluation due to technological equipment failure or disruption; . Information transmitted may not be sufficient (e.g. poor resolution of images) to allow for appropriate medical decision making by the Practitioner; and/or  . In rare instances, security protocols could fail, causing a breach of personal health information.  Furthermore, I acknowledge that it is my responsibility to provide information about my medical history, conditions and care that is complete and accurate to the best of my ability. I acknowledge that Practitioner's advice, recommendations, and/or decision may be based on factors not within their control, such as incomplete or inaccurate data provided by me or distortions of diagnostic images or specimens that may result from electronic transmissions. I understand that the practice of medicine is not an exact science and that Practitioner makes no warranties or guarantees regarding treatment outcomes. I acknowledge that a copy of this consent can be made available to me via my patient portal ([place]), or I can request a printed copy by calling the office of [facility].    I understand that my insurance will be billed for this visit.   I have read or had this consent read to me. . I understand the contents of this consent, which adequately explains the benefits and risks of the Services being provided via telemedicine.  . I have been provided ample opportunity to ask questions regarding this consent and the Services and have had my questions answered to my satisfaction. . I give my informed consent for the services to be provided through the use of telemedicine in my medical care

## 2020-03-16 ENCOUNTER — Encounter (HOSPITAL_COMMUNITY): Payer: Self-pay

## 2020-03-16 ENCOUNTER — Emergency Department (HOSPITAL_COMMUNITY)
Admission: EM | Admit: 2020-03-16 | Discharge: 2020-03-16 | Disposition: A | Discharge status: Home / Self Care | Payer: Medicaid Other | Attending: Emergency Medicine | Admitting: Emergency Medicine

## 2020-03-16 ENCOUNTER — Other Ambulatory Visit: Payer: Self-pay

## 2020-03-16 DIAGNOSIS — R109 Unspecified abdominal pain: Secondary | ICD-10-CM | POA: Insufficient documentation

## 2020-03-16 DIAGNOSIS — R6883 Chills (without fever): Secondary | ICD-10-CM | POA: Insufficient documentation

## 2020-03-16 DIAGNOSIS — Z5321 Procedure and treatment not carried out due to patient leaving prior to being seen by health care provider: Secondary | ICD-10-CM | POA: Insufficient documentation

## 2020-03-16 LAB — COMPREHENSIVE METABOLIC PANEL
ALT: 18 U/L (ref 0–44)
AST: 19 U/L (ref 15–41)
Albumin: 4.3 g/dL (ref 3.5–5.0)
Alkaline Phosphatase: 59 U/L (ref 38–126)
Anion gap: 14 (ref 5–15)
BUN: 12 mg/dL (ref 6–20)
CO2: 23 mmol/L (ref 22–32)
Calcium: 9.6 mg/dL (ref 8.9–10.3)
Chloride: 100 mmol/L (ref 98–111)
Creatinine, Ser: 1.08 mg/dL (ref 0.61–1.24)
GFR calc Af Amer: >60 mL/min (ref >60)
GFR calc non Af Amer: >60 mL/min (ref >60)
Glucose, Bld: 146 mg/dL — ABNORMAL HIGH (ref 70–99)
Potassium: 3.8 mmol/L (ref 3.5–5.1)
Sodium: 137 mmol/L (ref 135–145)
Total Bilirubin: 0.7 mg/dL (ref 0.3–1.2)
Total Protein: 7.5 g/dL (ref 6.5–8.1)

## 2020-03-16 LAB — CBC
HCT: 49.5 % (ref 39.0–52.0)
Hemoglobin: 16.4 g/dL (ref 13.0–17.0)
MCH: 27.5 pg (ref 26.0–34.0)
MCHC: 33.1 g/dL (ref 30.0–36.0)
MCV: 82.9 fL (ref 80.0–100.0)
Platelets: 210 10*3/uL (ref 150–400)
RBC: 5.97 MIL/uL — ABNORMAL HIGH (ref 4.22–5.81)
RDW: 14.5 % (ref 11.5–15.5)
WBC: 13 10*3/uL — ABNORMAL HIGH (ref 4.0–10.5)
nRBC: 0 % (ref 0.0–0.2)

## 2020-03-16 LAB — LIPASE, BLOOD: Lipase: 28 U/L (ref 11–51)

## 2020-03-16 NOTE — ED Triage Notes (Signed)
Patient arrived stating that this afternoon he had abdominal pain/pressure. States he has also been having chills. Report able to have a bowel movement but had no relief.

## 2020-03-17 ENCOUNTER — Ambulatory Visit (HOSPITAL_COMMUNITY)
Admission: EM | Admit: 2020-03-17 | Discharge: 2020-03-17 | Disposition: A | Discharge status: Home / Self Care | Payer: Medicaid Other | Attending: Family Medicine | Admitting: Family Medicine

## 2020-03-17 ENCOUNTER — Encounter: Payer: Self-pay | Admitting: Physician Assistant

## 2020-03-17 ENCOUNTER — Ambulatory Visit: Payer: Medicaid Other | Admitting: Physician Assistant

## 2020-03-17 ENCOUNTER — Other Ambulatory Visit: Payer: Self-pay

## 2020-03-17 ENCOUNTER — Telehealth: Payer: Self-pay

## 2020-03-17 ENCOUNTER — Ambulatory Visit (HOSPITAL_COMMUNITY): Payer: Medicaid Other

## 2020-03-17 ENCOUNTER — Encounter (HOSPITAL_COMMUNITY): Payer: Self-pay

## 2020-03-17 ENCOUNTER — Encounter (HOSPITAL_COMMUNITY): Payer: Self-pay | Admitting: Emergency Medicine

## 2020-03-17 DIAGNOSIS — R0602 Shortness of breath: Secondary | ICD-10-CM | POA: Insufficient documentation

## 2020-03-17 DIAGNOSIS — R6883 Chills (without fever): Secondary | ICD-10-CM | POA: Insufficient documentation

## 2020-03-17 DIAGNOSIS — J069 Acute upper respiratory infection, unspecified: Secondary | ICD-10-CM | POA: Insufficient documentation

## 2020-03-17 DIAGNOSIS — D72829 Elevated white blood cell count, unspecified: Secondary | ICD-10-CM | POA: Insufficient documentation

## 2020-03-17 DIAGNOSIS — K5790 Diverticulosis of intestine, part unspecified, without perforation or abscess without bleeding: Secondary | ICD-10-CM | POA: Diagnosis present

## 2020-03-17 DIAGNOSIS — R5381 Other malaise: Secondary | ICD-10-CM | POA: Diagnosis present

## 2020-03-17 DIAGNOSIS — R1032 Left lower quadrant pain: Secondary | ICD-10-CM | POA: Insufficient documentation

## 2020-03-17 DIAGNOSIS — R05 Cough: Secondary | ICD-10-CM | POA: Diagnosis present

## 2020-03-17 DIAGNOSIS — Z20822 Contact with and (suspected) exposure to covid-19: Secondary | ICD-10-CM | POA: Insufficient documentation

## 2020-03-17 DIAGNOSIS — R1030 Lower abdominal pain, unspecified: Secondary | ICD-10-CM | POA: Diagnosis present

## 2020-03-17 DIAGNOSIS — F17200 Nicotine dependence, unspecified, uncomplicated: Secondary | ICD-10-CM | POA: Diagnosis present

## 2020-03-17 DIAGNOSIS — R059 Cough, unspecified: Secondary | ICD-10-CM

## 2020-03-17 MED ORDER — TRAMADOL HCL 50 MG PO TABS: 50.0000 mg | ORAL_TABLET | Freq: Four times a day (QID) | ORAL | 0 refills | Status: DC | PRN

## 2020-03-17 MED ORDER — BENZONATATE 100 MG PO CAPS: 100.0000 mg | ORAL_CAPSULE | Freq: Three times a day (TID) | ORAL | 0 refills | Status: DC | PRN

## 2020-03-17 MED ORDER — PROMETHAZINE-DM 6.25-15 MG/5ML PO SYRP: 5.0000 mL | ORAL_SOLUTION | Freq: Every evening | ORAL | 0 refills | Status: DC | PRN

## 2020-03-17 MED ORDER — AMOXICILLIN-POT CLAVULANATE 875-125 MG PO TABS: 1.0000 | ORAL_TABLET | Freq: Two times a day (BID) | ORAL | 0 refills | Status: DC

## 2020-03-17 MED ORDER — MELOXICAM 7.5 MG PO TABS: 7.5000 mg | ORAL_TABLET | Freq: Every day | ORAL | 0 refills | Status: DC

## 2020-03-17 NOTE — ED Notes (Signed)
covid sample obtained, labeled and placed in lab.  Liquid in tube, and lid is tightly adgusted.

## 2020-03-17 NOTE — Telephone Encounter (Signed)
Rescheduled echo and visit with Katie to 9/1.  The patient will call if symptoms do not subside before that time. He was grateful for assistance.

## 2020-03-17 NOTE — Telephone Encounter (Signed)
The patient called and cancelled his echo today due to illness - he is getting COVID tested today. Left message for patient that his virtual visit tomorrow will be cancelled as well. Instructed him to call back and both his echo and visit will be scheduled at a later time.

## 2020-03-17 NOTE — Discharge Instructions (Addendum)
Call Dr. Leonides Grills office today and see if he can set up an outpatient CT abdomen/pelvis to fully evaluated for diverticulitis. You have an elevated white blood cell count and significant abdominal pain with a history of diverticulosis seen on your colonoscopy from 2017.   We will notify you of your COVID-19 test results as they arrive and may take between 24 to 48 hours.  I encourage you to sign up for MyChart if you have not already done so as this can be the easiest way for [place] to communicate results to you online or through a phone app.  In the meantime, if you develop worsening symptoms including fever, chest pain, shortness of breath despite our current treatment plan then please report to the emergency room as this may be a sign of worsening status from possible COVID-19 infection.  Otherwise, we will manage this as a viral syndrome. For sore throat or cough try using a honey-based tea. Use 3 teaspoons of honey with juice squeezed from half lemon. Place shaved pieces of ginger into 1/2-1 cup of water and warm over stove top. Then mix the ingredients and repeat every 4 hours as needed. Please take Tylenol 500mg -650mg  every 6 hours for aches and pains, fevers. Hydrate very well with at least 2 liters of water. Eat light meals such as soups to replenish electrolytes and soft fruits, veggies. Start an antihistamine like Zyrtec, Allegra or Claritin for postnasal drainage, sinus congestion.   Please schedule meloxicam 1-2 times daily with food for your severe pain.  If you still have pain despite taking meloxicam regularly, this is breakthrough pain.  You can use tramadol, a narcotic pain medicine, once every 4-6 hours for this.  Once your pain is better controlled, switch back to just meloxicam.

## 2020-03-17 NOTE — ED Triage Notes (Signed)
Abdominal cramping, chills, cough, slight headache  Onset yesterday of symptoms.    No nausea, vomiting and slight diarrhea.  No n/v/d today, but continued abdominal cramping

## 2020-03-17 NOTE — Telephone Encounter (Signed)
Pt is calling to scheduled Echo Bubble. Please advise.

## 2020-03-17 NOTE — ED Provider Notes (Signed)
[facility]   MRN: [id number] DOB: 08/07/1965  Subjective:   Zachary Powers is a 54 y.o. male presenting for 1 day history of acute onset chills, cough, mild intermittent shortness of breath.  Started having abdominal cramping and prompted him to go to the emergency room.  He had blood work done but left without being seen.  Patient has had COVID vaccination, last dosing was about 2 months ago.  Denies loss of sense of taste and smell, chest pain, bloody stools, diarrhea.  States that he had his colonoscopy when he was 4 and was fine.  No current facility-administered medications for this encounter.  Current Outpatient Medications:    aspirin EC 325 MG tablet, Take 325 mg by mouth daily., Disp: , Rfl:    atorvastatin (LIPITOR) 80 MG tablet, Take 1 tablet (80 mg total) by mouth daily at 6 PM., Disp: 30 tablet, Rfl: 1   BLACK CURRANT SEED OIL PO, Take 1 capsule by mouth daily., Disp: , Rfl:    diclofenac (VOLTAREN) 75 MG EC tablet, Take 150 mg by mouth daily., Disp: , Rfl:    Flaxseed, Linseed, (FLAX SEED OIL PO), Take 1 capsule by mouth daily., Disp: , Rfl:    hydrochlorothiazide (HYDRODIURIL) 25 MG tablet, Take 25 mg by mouth daily., Disp: , Rfl:    losartan (COZAAR) 100 MG tablet, Take 100 mg by mouth daily., Disp: , Rfl:    Multiple Vitamin (MULTIVITAMIN WITH MINERALS) TABS tablet, Take 1 tablet by mouth daily., Disp: , Rfl:    nicotine (NICODERM CQ - DOSED IN MG/24 HOURS) 14 mg/24hr patch, Place 1 patch (14 mg total) onto the skin daily., Disp: 28 patch, Rfl: 0   zolpidem (AMBIEN) 10 MG tablet, Take 10 mg by mouth at bedtime as needed for sleep., Disp: , Rfl:    Allergies  Allergen Reactions   Barbiturates     Recovering addict    Past Medical History:  Diagnosis Date   Anxiety    Asthma    Back pain    Cryptogenic stroke (HCC)    Depression    GERD (gastroesophageal reflux disease)    Hypertension    PFO (patent foramen ovale)      Past  Surgical History:  Procedure Laterality Date   BUBBLE STUDY  03/19/2019   Procedure: BUBBLE STUDY;  Surgeon: Jerline Pain, MD;  Location: [facility];  Service: Cardiovascular;;   HAND SURGERY Left 1991   KNEE ARTHROSCOPY Left 04/10/2018   Procedure: Left knee arthroscopy, debridement, chondroplasty patella, possible menisectomy;  Surgeon: Susa Day, MD;  Location: [facility];  Service: Orthopedics;  Laterality: Left;  2 hrs for both procedures   PATENT FORAMEN OVALE(PFO) CLOSURE N/A 03/19/2019   Procedure: PATENT FORAMEN OVALE (PFO) CLOSURE;  Surgeon: Sherren Mocha, MD;  Location: [facility];  Service: Cardiovascular;  Laterality: N/A;   QUADRICEPS TENDON REPAIR Left 04/10/2018   Procedure: Mini open quad tendon repair;  Surgeon: Susa Day, MD;  Location: [facility];  Service: Orthopedics;  Laterality: Left;   TEE WITHOUT CARDIOVERSION N/A 03/19/2019   Procedure: TRANSESOPHAGEAL ECHOCARDIOGRAM (TEE);  Surgeon: Jerline Pain, MD;  Location: [facility];  Service: Cardiovascular;  Laterality: N/A;    Family History  Problem Relation Age of Onset   Diabetes Father    Heart disease Father    Diabetes Paternal Grandmother    Stroke Mother    Colon cancer Neg Hx     Social History   Tobacco Use  Smoking status: Current Every Day Smoker    Packs/day: 0.50    Types: Cigarettes   Smokeless tobacco: Never Used  Vaping Use   Vaping Use: Never used  Substance Use Topics   Alcohol use: No    Alcohol/week: 0.0 standard drinks   Drug use: No    Comment: no use in 6 years    ROS   Objective:   Vitals: BP (!) 138/91 (BP Location: Right Arm) Comment (BP Location): did not take medicine today   Pulse 91    Temp 98.7 F (37.1 C) (Oral)    Resp 18    SpO2 97%   Physical Exam Constitutional:      General: He is not in acute distress.    Appearance: Normal appearance. He is well-developed and normal weight. He is not ill-appearing, toxic-appearing or  diaphoretic.  HENT:     Head: Normocephalic and atraumatic.     Right Ear: External ear normal.     Left Ear: External ear normal.     Nose: Nose normal.     Mouth/Throat:     Pharynx: Oropharynx is clear.  Eyes:     General: No scleral icterus.       Right eye: No discharge.        Left eye: No discharge.     Extraocular Movements: Extraocular movements intact.     Pupils: Pupils are equal, round, and reactive to light.  Cardiovascular:     Rate and Rhythm: Normal rate.  Pulmonary:     Effort: Pulmonary effort is normal.  Abdominal:     General: Bowel sounds are normal. There is no distension.     Palpations: Abdomen is soft. There is no mass.     Tenderness: There is abdominal tenderness (worse over llq) in the right lower quadrant, suprapubic area and left lower quadrant. There is no right CVA tenderness, left CVA tenderness, guarding or rebound.  Musculoskeletal:     Cervical back: Normal range of motion.  Skin:    General: Skin is warm and dry.  Neurological:     Mental Status: He is alert and oriented to person, place, and time.  Psychiatric:        Mood and Affect: Mood normal.        Behavior: Behavior normal.        Thought Content: Thought content normal.        Judgment: Judgment normal.     Recent Results (from the past 2160 hour(s))  Lipase, blood     Status: None   Collection Time: 03/16/20  8:44 PM  Result Value Ref Range   Lipase 28 11 - 51 U/L    Comment: Performed at [facility], [place] [address], [place], [place] [address]  Comprehensive metabolic panel     Status: Abnormal   Collection Time: 03/16/20  8:44 PM  Result Value Ref Range   Sodium 137 135 - 145 mmol/L   Potassium 3.8 3.5 - 5.1 mmol/L   Chloride 100 98 - 111 mmol/L   CO2 23 22 - 32 mmol/L   Glucose, Bld 146 (H) 70 - 99 mg/dL    Comment: Glucose reference range applies only to samples taken after fasting for at least 8 hours.   BUN 12 6 - 20 mg/dL   Creatinine, Ser  1.08 0.61 - 1.24 mg/dL   Calcium 9.6 8.9 - 10.3 mg/dL   Total Protein 7.5 6.5 - 8.1 g/dL   Albumin 4.3 3.5 -  5.0 g/dL   AST 19 15 - 41 U/L   ALT 18 0 - 44 U/L   Alkaline Phosphatase 59 38 - 126 U/L   Total Bilirubin 0.7 0.3 - 1.2 mg/dL   GFR calc non Af Amer >60 >60 mL/min   GFR calc Af Amer >60 >60 mL/min   Anion gap 14 5 - 15    Comment: Performed at [facility], [place] [address], [place], [place] [address]  CBC     Status: Abnormal   Collection Time: 03/16/20  8:44 PM  Result Value Ref Range   WBC 13.0 (H) 4.0 - 10.5 K/uL   RBC 5.97 (H) 4.22 - 5.81 MIL/uL   Hemoglobin 16.4 13.0 - 17.0 g/dL   HCT 49.5 39 - 52 %   MCV 82.9 80.0 - 100.0 fL   MCH 27.5 26.0 - 34.0 pg   MCHC 33.1 30.0 - 36.0 g/dL   RDW 14.5 11.5 - 15.5 %   Platelets 210 150 - 400 K/uL   nRBC 0.0 0.0 - 0.2 %    Comment: Performed at [facility], [place] [address] [place], [place] [address]     Assessment and Plan :   PDMP not reviewed this encounter.  1. Viral URI with cough   2. Cough   3. Chills   4. Malaise   5. Shortness of breath   6. Abdominal pain, left lower quadrant   7. Lower abdominal pain   8. Diverticulosis   9. Smoker   10. Leukocytosis, unspecified type     Chart review shows findings of diverticulosis from acsending all the way to descending colon from his colonoscopy on 2017.  He was unable to recall these results.  Emphasized that his abdominal pain and leukocytosis is concerning for developing diverticulitis.  He was already in the ER in does not want to go back there.  He does have a PCP Dr. Christean Grief.  Recommended he contact them for an outpatient CT scan.  Otherwise, will start patient empirically on Augmentin to cover for diverticulitis.  Schedule Tylenol and use tramadol for breakthrough pain.  Low suspicion for COVID-19.  Suspect that he is having a concurrent viral URI.  Recommended he use his albuterol inhaler at home.  COVID-19 testing is pending.   Maintain strict ER precautions.   Jaynee Eagles, PA-C 03/17/20 1510

## 2020-03-17 NOTE — Telephone Encounter (Signed)
This encounter was created in error - please disregard.

## 2020-03-18 ENCOUNTER — Telehealth: Payer: Medicaid Other | Admitting: Physician Assistant

## 2020-03-18 ENCOUNTER — Ambulatory Visit: Payer: Medicaid Other | Admitting: Physician Assistant

## 2020-03-18 LAB — SARS CORONAVIRUS 2 (TAT 6-24 HRS): SARS Coronavirus 2: NEGATIVE

## 2020-03-31 ENCOUNTER — Telehealth (HOSPITAL_COMMUNITY): Payer: Self-pay | Admitting: Physician Assistant

## 2020-03-31 ENCOUNTER — Ambulatory Visit: Payer: Medicaid Other | Admitting: Physician Assistant

## 2020-03-31 ENCOUNTER — Other Ambulatory Visit (HOSPITAL_COMMUNITY): Payer: Medicaid Other

## 2020-03-31 ENCOUNTER — Encounter (HOSPITAL_COMMUNITY): Payer: Self-pay | Admitting: Cardiology

## 2020-03-31 NOTE — Progress Notes (Unsigned)
Patient ID: Zachary Powers, male   DOB: 02-24-1966, 54 y.o.   MRN: [id number]   Verified appointment "no show" status with Renetta at 2:14pm.

## 2020-03-31 NOTE — Telephone Encounter (Signed)
Patient No Showed echo/bubble on 03/31/2020 and rescheduled for 04/07/2020. Patient cancelled 04/07/2020 and will call later to reschedule. Order will be removed form the [facility] and if patient calls back to reschedule we will reinstate the order.

## 2020-03-31 NOTE — Telephone Encounter (Signed)
Thank you for letting us know.  

## 2020-03-31 NOTE — Progress Notes (Deleted)
[facility]                                       Cardiology Office Note    Date:  03/31/2020   ID:  Zachary Powers, DOB 1966-05-18, MRN [id number]  PCP:  Nolene Ebbs, MD  Cardiologist:  Dr. Burt Knack   CC: 1 year s/p PFO closure  History of Present Illness:  Zachary Powers is a 54 y.o. male with a history of HTN, intracranial stenosis, cryptogenic CVA, and PFO s/p PFO closure (03/09/19) who presents for follow up.   He presented with blurry/double vision, dizziness, and gait instability in 01/2019.Symptoms improved over several hours. Neuro imaging demonstrated small right parietal cortical infarcts considered etiologies included large vessel disease versus PFO. Formal neurologic evaluation and stroke work-up was completed in the hospital. This included a 2D echocardiogram demonstrated normal LV systolic function, no valvular disease, and no clear cardiac source of embolus. Lower extremity venous Doppler was negative. Hypercoagulable panel was negative. The patient was found to have intracranial stenoses present. A transcranial Doppler study demonstrated Spencer grade 3 shunt with Valsalva. He was referred to Dr. Burt Knack for consideration of transcatheter PFO closure.  TEE 03/19/19 confirmed PFO and he underwent successful transcatheter PFO closure using an 18 mm Amplatzer PFO occluder. Post op echo showed EF 55-60% s/p PFO closure with no residual flow. He was placed on aspirin and plavix to be continued at least 3 months.   Today he presents to clinic for follow up.    Past Medical History:  Diagnosis Date  . Anxiety   . Asthma   . Back pain   . Cryptogenic stroke ([facility])   . Depression   . GERD (gastroesophageal reflux disease)   . Hypertension   . PFO (patent foramen ovale)     Past Surgical History:  Procedure Laterality Date  . BUBBLE STUDY  03/19/2019   Procedure: BUBBLE STUDY;  Surgeon: Jerline Pain, MD;  Location:  [facility];  Service: Cardiovascular;;  . HAND SURGERY Left 1991  . KNEE ARTHROSCOPY Left 04/10/2018   Procedure: Left knee arthroscopy, debridement, chondroplasty patella, possible menisectomy;  Surgeon: Susa Day, MD;  Location: [facility];  Service: Orthopedics;  Laterality: Left;  2 hrs for both procedures  . PATENT FORAMEN OVALE(PFO) CLOSURE N/A 03/19/2019   Procedure: PATENT FORAMEN OVALE (PFO) CLOSURE;  Surgeon: Sherren Mocha, MD;  Location: [facility];  Service: Cardiovascular;  Laterality: N/A;  . QUADRICEPS TENDON REPAIR Left 04/10/2018   Procedure: Mini open quad tendon repair;  Surgeon: Susa Day, MD;  Location: [facility];  Service: Orthopedics;  Laterality: Left;  . TEE WITHOUT CARDIOVERSION N/A 03/19/2019   Procedure: TRANSESOPHAGEAL ECHOCARDIOGRAM (TEE);  Surgeon: Jerline Pain, MD;  Location: [facility];  Service: Cardiovascular;  Laterality: N/A;    Current Medications: Outpatient Medications Prior to Visit  Medication Sig Dispense Refill  . amoxicillin-clavulanate (AUGMENTIN) 875-125 MG tablet Take 1 tablet by mouth every 12 (twelve) hours. 20 tablet 0  . aspirin EC 325 MG tablet Take 325 mg by mouth daily.    Marland Kitchen atorvastatin (LIPITOR) 80 MG tablet Take 1 tablet (80 mg total) by mouth daily at 6 PM. 30 tablet 1  . benzonatate (TESSALON) 100 MG capsule Take 1-2 capsules (100-200 mg total) by mouth 3 (three) times daily as needed. 60 capsule 0  .  diclofenac (VOLTAREN) 75 MG EC tablet Take 150 mg by mouth daily.    . hydrochlorothiazide (HYDRODIURIL) 25 MG tablet Take 25 mg by mouth daily.    Marland Kitchen losartan (COZAAR) 100 MG tablet Take 100 mg by mouth daily.    . meloxicam (MOBIC) 7.5 MG tablet Take 1 tablet (7.5 mg total) by mouth daily. 30 tablet 0  . Multiple Vitamin (MULTIVITAMIN WITH MINERALS) TABS tablet Take 1 tablet by mouth daily.    . nabumetone (RELAFEN) 750 MG tablet Take 750 mg by mouth 2 (two) times daily as needed.    . nicotine (NICODERM CQ - DOSED IN  MG/24 HOURS) 14 mg/24hr patch Place 1 patch (14 mg total) onto the skin daily. 28 patch 0  . promethazine-dextromethorphan (PROMETHAZINE-DM) 6.25-15 MG/5ML syrup Take 5 mLs by mouth at bedtime as needed for cough. 100 mL 0  . traMADol (ULTRAM) 50 MG tablet Take 1 tablet (50 mg total) by mouth every 6 (six) hours as needed. 15 tablet 0  . zolpidem (AMBIEN) 10 MG tablet Take 10 mg by mouth at bedtime as needed for sleep.     No facility-administered medications prior to visit.     Allergies:   Barbiturates   Social History   Socioeconomic History  . Marital status: Single    Spouse name: Not on file  . Number of children: Not on file  . Years of education: Not on file  . Highest education level: Not on file  Occupational History  . Not on file  Tobacco Use  . Smoking status: Current Every Day Smoker    Packs/day: 0.50    Types: Cigarettes  . Smokeless tobacco: Never Used  Vaping Use  . Vaping Use: Never used  Substance and Sexual Activity  . Alcohol use: No    Alcohol/week: 0.0 standard drinks  . Drug use: No    Comment: no use in 6 years  . Sexual activity: Yes  Other Topics Concern  . Not on file  Social History Narrative  . Not on file   Social Determinants of Health   Financial Resource Strain:   . Difficulty of Paying Living Expenses: Not on file  Food Insecurity:   . Worried About Charity fundraiser in the Last Year: Not on file  . Ran Out of Food in the Last Year: Not on file  Transportation Needs:   . Lack of Transportation (Medical): Not on file  . Lack of Transportation (Non-Medical): Not on file  Physical Activity:   . Days of Exercise per Week: Not on file  . Minutes of Exercise per Session: Not on file  Stress:   . Feeling of Stress : Not on file  Social Connections:   . Frequency of Communication with Friends and Family: Not on file  . Frequency of Social Gatherings with Friends and Family: Not on file  . Attends Religious Services: Not on file  .  Active Member of Clubs or Organizations: Not on file  . Attends Archivist Meetings: Not on file  . Marital Status: Not on file     Family History:  The patient's family history includes Diabetes in his father and paternal grandmother; Heart disease in his father; Stroke in his mother.     ROS:   Please see the history of present illness.    ROS All other systems reviewed and are negative.   PHYSICAL EXAM:   VS:  There were no vitals taken for this visit.  GEN: Well nourished, well developed, in no acute distress HEENT: normal Neck: no JVD or masses Cardiac: ***RRR; no murmurs, rubs, or gallops,no edema  Respiratory:  clear to auscultation bilaterally, normal work of breathing GI: soft, nontender, nondistended, + BS MS: no deformity or atrophy Skin: warm and dry, no rash Neuro:  Alert and Oriented x 3, Strength and sensation are intact Psych: euthymic mood, full affect   Wt Readings from Last 3 Encounters:  07/14/19 223 lb 12.8 oz (101.5 kg)  03/19/19 210 lb (95.3 kg)  03/13/19 212 lb (96.2 kg)      Studies/Labs Reviewed:   EKG:  EKG is*** ordered today.  The ekg ordered today demonstrates ***  Recent Labs: 03/16/2020: ALT 18; BUN 12; Creatinine, Ser 1.08; Hemoglobin 16.4; Platelets 210; Potassium 3.8; Sodium 137   Lipid Panel    Component Value Date/Time   CHOL 193 01/28/2019 0555   TRIG 144 01/28/2019 0555   HDL 34 (L) 01/28/2019 0555   CHOLHDL 5.7 01/28/2019 0555   VLDL 29 01/28/2019 0555   LDLCALC 130 (H) 01/28/2019 0555    Additional studies/ records that were reviewed today include:  03/19/19 PATENT FORAMEN OVALE (PFO) CLOSURE  Conclusion Successful transcatheter PFO closure using an 18 mm Amplatzer PFO occluder with intracardiac echo and fluoroscopic guidance  Recommendations  Antiplatelet/Anticoag Recommend uninterrupted dual antiplatelet therapy with Aspirin 81mg  daily and Clopidogrel 75mg  daily for 3 months.    ________________   03/19/19 TTE IMPRESSIONS 1. The left ventricle has normal systolic function, with an ejection fraction of 55-60%. The cavity size was normal. There is mildly increased left ventricular wall thickness. Left ventricular diastolic parameters were normal. 2. The right ventricle has normal systolc function. The cavity was normal. 3. Left atrial size was mildly dilated. 4. Post PFO closure no obvous residual flow no bubble study performed Device used was a 18 mm Amplatzer occluder. 5. Mild thickening of the mitral valve leaflet. 6. Mild thickening of the aortic valve Mild calcification of the aortic valve. 7. The aortic root is normal in size and structure.   _____________________  Echo with bubble 03/31/20 ***   ASSESSMENT & PLAN:   PFO s/p PFO closure:    Medication Adjustments/Labs and Tests Ordered: Current medicines are reviewed at length with the patient today.  Concerns regarding medicines are outlined above.  Medication changes, Labs and Tests ordered today are listed in the Patient Instructions below. There are no Patient Instructions on file for this visit.   Signed, Angelena Form, PA-C  03/31/2020 11:08 AM    [facility] [place], [place], [place]  [address] Phone: [phone]; Fax: [phone]

## 2020-04-02 ENCOUNTER — Other Ambulatory Visit: Payer: Self-pay

## 2020-04-02 DIAGNOSIS — Q2112 Patent foramen ovale: Secondary | ICD-10-CM

## 2020-04-07 ENCOUNTER — Other Ambulatory Visit (HOSPITAL_COMMUNITY): Payer: Medicaid Other

## 2020-04-22 ENCOUNTER — Other Ambulatory Visit: Payer: Self-pay

## 2020-04-22 ENCOUNTER — Ambulatory Visit (INDEPENDENT_AMBULATORY_CARE_PROVIDER_SITE_OTHER): Payer: Medicaid Other | Admitting: Physician Assistant

## 2020-04-22 ENCOUNTER — Other Ambulatory Visit: Payer: Self-pay | Admitting: Physician Assistant

## 2020-04-22 ENCOUNTER — Ambulatory Visit (HOSPITAL_COMMUNITY): Payer: Medicaid Other | Attending: Internal Medicine

## 2020-04-22 ENCOUNTER — Encounter: Payer: Self-pay | Admitting: Physician Assistant

## 2020-04-22 VITALS — BP 132/70 | HR 66 | Ht 69.0 in | Wt 209.2 lb

## 2020-04-22 DIAGNOSIS — Q2112 Patent foramen ovale: Secondary | ICD-10-CM

## 2020-04-22 DIAGNOSIS — Z8774 Personal history of (corrected) congenital malformations of heart and circulatory system: Secondary | ICD-10-CM

## 2020-04-22 DIAGNOSIS — Q211 Atrial septal defect: Secondary | ICD-10-CM | POA: Insufficient documentation

## 2020-04-22 DIAGNOSIS — Z720 Tobacco use: Secondary | ICD-10-CM

## 2020-04-22 LAB — ECHOCARDIOGRAM LIMITED BUBBLE STUDY
Area-P 1/2: 2.52 cm2
S' Lateral: 2.9 cm

## 2020-04-22 MED ORDER — ASPIRIN EC 81 MG PO TBEC: 81.0000 mg | DELAYED_RELEASE_TABLET | Freq: Every day | ORAL | 2 refills | Status: AC

## 2020-04-22 MED ORDER — NICOTINE 21 MG/24HR TD PT24: 21.0000 mg | MEDICATED_PATCH | Freq: Every day | TRANSDERMAL | 2 refills | Status: DC

## 2020-04-22 NOTE — Progress Notes (Signed)
[facility] [place]                                       Cardiology Office Note    Date:  04/23/2020   ID:  Zachary Powers, DOB Aug 09, 1965, MRN [id number]  PCP:  Nolene Ebbs, MD  Cardiologist:  Dr. Burt Knack  CC: 1 year s/p PFO closure  History of Present Illness:  Zachary Powers is a 54 y.o. male with a history of HTN, intracranial stenosis, cryptogenic CVA, and PFO s/p PFO closure (03/09/19) who presents for follow up.    He presented with blurry/double vision, dizziness, and gait instability in 01/2019.Symptoms improved over several hours. Neuro imaging demonstrated small right parietal cortical infarcts considered etiologies included large vessel disease versus PFO. Formal neurologic evaluation and stroke work-up was completed in the hospital. This included a 2D echocardiogram demonstrated normal LV systolic function, no valvular disease, and no clear cardiac source of embolus. Lower extremity venous Doppler was negative. Hypercoagulable panel was negative. The patient was found to have intracranial stenoses present. A transcranial Doppler study demonstrated Spencer grade 3 shunt with Valsalva. He was referred to Dr. Burt Knack for consideration of transcatheter PFO closure.    TEE 03/19/19 confirmed PFO and he underwent successful transcatheter PFO closure using an 18 mm Amplatzer PFO occluder. Post op echo showed EF 55-60% s/p PFO closure with no residual flow. He was placed on aspirin and plavix to be continued at least 3 months.   Today he presents to clinic for follow up. No CP or SOB. No LE edema, orthopnea or PND. No dizziness or syncope. No blood in stool or urine. No palpitations. Still smoking. Wants to quit. Requests nicotine patch.    Past Medical History:  Diagnosis Date  . Anxiety   . Asthma   . Back pain   . Cryptogenic stroke ([place])   . Depression   . GERD (gastroesophageal reflux disease)   . Hypertension   . PFO  (patent foramen ovale)     Past Surgical History:  Procedure Laterality Date  . BUBBLE STUDY  03/19/2019   Procedure: BUBBLE STUDY;  Surgeon: Jerline Pain, MD;  Location: [facility] ENDOSCOPY;  Service: Cardiovascular;;  . HAND SURGERY Left 1991  . KNEE ARTHROSCOPY Left 04/10/2018   Procedure: Left knee arthroscopy, debridement, chondroplasty patella, possible menisectomy;  Surgeon: Susa Day, MD;  Location: [facility] ORS;  Service: Orthopedics;  Laterality: Left;  2 hrs for both procedures  . PATENT FORAMEN OVALE(PFO) CLOSURE N/A 03/19/2019   Procedure: PATENT FORAMEN OVALE (PFO) CLOSURE;  Surgeon: Sherren Mocha, MD;  Location: [place] [facility];  Service: Cardiovascular;  Laterality: N/A;  . QUADRICEPS TENDON REPAIR Left 04/10/2018   Procedure: Mini open quad tendon repair;  Surgeon: Susa Day, MD;  Location: [facility] ORS;  Service: Orthopedics;  Laterality: Left;  . TEE WITHOUT CARDIOVERSION N/A 03/19/2019   Procedure: TRANSESOPHAGEAL ECHOCARDIOGRAM (TEE);  Surgeon: Jerline Pain, MD;  Location: [facility] ENDOSCOPY;  Service: Cardiovascular;  Laterality: N/A;    Current Medications: Outpatient Medications Prior to Visit  Medication Sig Dispense Refill  . amoxicillin-clavulanate (AUGMENTIN) 875-125 MG tablet Take 1 tablet by mouth every 12 (twelve) hours. 20 tablet 0  . atorvastatin (LIPITOR) 80 MG tablet Take 1 tablet (80 mg total) by mouth daily at 6 PM. 30 tablet 1  . benzonatate (TESSALON) 100 MG capsule Take  1-2 capsules (100-200 mg total) by mouth 3 (three) times daily as needed. 60 capsule 0  . diclofenac (VOLTAREN) 75 MG EC tablet Take 150 mg by mouth daily.    . hydrochlorothiazide (HYDRODIURIL) 25 MG tablet Take 25 mg by mouth daily.    Marland Kitchen LINZESS 145 MCG CAPS capsule Take 145 mcg by mouth every morning.    Marland Kitchen losartan (COZAAR) 100 MG tablet Take 100 mg by mouth daily.    . meloxicam (MOBIC) 7.5 MG tablet Take 1 tablet (7.5 mg total) by mouth daily. 30 tablet 0  . Multiple Vitamin (MULTIVITAMIN  WITH MINERALS) TABS tablet Take 1 tablet by mouth daily.    . nabumetone (RELAFEN) 750 MG tablet Take 750 mg by mouth 2 (two) times daily as needed.    . naproxen (NAPROSYN) 500 MG tablet Take 500 mg by mouth 2 (two) times daily.    Marland Kitchen PROAIR HFA 108 (90 Base) MCG/ACT inhaler Inhale 2 puffs into the lungs 2 (two) times daily as needed.    . promethazine-dextromethorphan (PROMETHAZINE-DM) 6.25-15 MG/5ML syrup Take 5 mLs by mouth at bedtime as needed for cough. 100 mL 0  . traMADol (ULTRAM) 50 MG tablet Take 1 tablet (50 mg total) by mouth every 6 (six) hours as needed. 15 tablet 0  . zolpidem (AMBIEN) 10 MG tablet Take 10 mg by mouth at bedtime as needed for sleep.    Marland Kitchen aspirin EC 325 MG tablet Take 325 mg by mouth daily.    . nicotine (NICODERM CQ - DOSED IN MG/24 HOURS) 14 mg/24hr patch Place 1 patch (14 mg total) onto the skin daily. 28 patch 0   No facility-administered medications prior to visit.     Allergies:   Barbiturates   Social History   Socioeconomic History  . Marital status: Single    Spouse name: Not on file  . Number of children: Not on file  . Years of education: Not on file  . Highest education level: Not on file  Occupational History  . Not on file  Tobacco Use  . Smoking status: Current Every Day Smoker    Packs/day: 0.50    Types: Cigarettes  . Smokeless tobacco: Never Used  Vaping Use  . Vaping Use: Never used  Substance and Sexual Activity  . Alcohol use: No    Alcohol/week: 0.0 standard drinks  . Drug use: No    Comment: no use in 6 years  . Sexual activity: Yes  Other Topics Concern  . Not on file  Social History Narrative  . Not on file   Social Determinants of Health   Financial Resource Strain:   . Difficulty of Paying Living Expenses: Not on file  Food Insecurity:   . Worried About Charity fundraiser in the Last Year: Not on file  . Ran Out of Food in the Last Year: Not on file  Transportation Needs:   . Lack of Transportation (Medical):  Not on file  . Lack of Transportation (Non-Medical): Not on file  Physical Activity:   . Days of Exercise per Week: Not on file  . Minutes of Exercise per Session: Not on file  Stress:   . Feeling of Stress : Not on file  Social Connections:   . Frequency of Communication with Friends and Family: Not on file  . Frequency of Social Gatherings with Friends and Family: Not on file  . Attends Religious Services: Not on file  . Active Member of Clubs or Organizations: Not  on file  . Attends Archivist Meetings: Not on file  . Marital Status: Not on file     Family History:  The patient's family history includes Diabetes in his father and paternal grandmother; Heart disease in his father; Stroke in his mother.     ROS:   Please see the history of present illness.    ROS All other systems reviewed and are negative.   PHYSICAL EXAM:   VS:  BP 132/70   Pulse 66   Ht 5\' 9"  (1.753 m)   Wt 209 lb 3.2 oz (94.9 kg)   SpO2 97%   BMI 30.89 kg/m    GEN: Well nourished, well developed, in no acute distress HEENT: normal Neck: no JVD or masses Cardiac: RRR; no murmurs, rubs, or gallops,no edema  Respiratory:  clear to auscultation bilaterally, normal work of breathing GI: soft, nontender, nondistended, + BS MS: no deformity or atrophy Skin: warm and dry, no rash Neuro:  Alert and Oriented x 3, Strength and sensation are intact Psych: euthymic mood, full affect   Wt Readings from Last 3 Encounters:  04/22/20 209 lb 3.2 oz (94.9 kg)  07/14/19 223 lb 12.8 oz (101.5 kg)  03/19/19 210 lb (95.3 kg)      Studies/Labs Reviewed:   EKG:  EKG is ordered today.  The ekg ordered today demonstrates sinus HR 66 bpm  Recent Labs: 03/16/2020: ALT 18; BUN 12; Creatinine, Ser 1.08; Hemoglobin 16.4; Platelets 210; Potassium 3.8; Sodium 137   Lipid Panel    Component Value Date/Time   CHOL 193 01/28/2019 0555   TRIG 144 01/28/2019 0555   HDL 34 (L) 01/28/2019 0555   CHOLHDL 5.7  01/28/2019 0555   VLDL 29 01/28/2019 0555   LDLCALC 130 (H) 01/28/2019 0555    Additional studies/ records that were reviewed today include:  03/18/20 PATENT FORAMEN OVALE (PFO) CLOSURE  Conclusion  Successful transcatheter PFO closure using an 18 mm Amplatzer PFO occluder with intracardiac echo and fluoroscopic guidance Recommendations  Antiplatelet/Anticoag Recommend uninterrupted dual antiplatelet therapy with Aspirin 81mg  daily and Clopidogrel 75mg  daily for 3 months.   _____________________  Limited echo 03/19/19 IMPRESSIONS  1. The left ventricle has normal systolic function, with an ejection  fraction of 55-60%. The cavity size was normal. There is mildly increased  left ventricular wall thickness. Left ventricular diastolic parameters  were normal.  2. The right ventricle has normal systolc function. The cavity was  normal.  3. Left atrial size was mildly dilated.  4. Post PFO closure no obvous residual flow no bubble study performed  Device used was a 18 mm Amplatzer occluder.  5. Mild thickening of the mitral valve leaflet.  6. Mild thickening of the aortic valve Mild calcification of the aortic  valve.  7. The aortic root is normal in size and structure.   _______________________  Echo with bubble 04/22/20 IMPRESSIONS 1. S/p PFO closure with 18 mm Amplatzer atrial septal occluder device. No  residual flow seen by color flow Doppler. Agitated saline contrast bubble  study was negative, with no evidence of any interatrial shunt.  2. Left ventricular ejection fraction, by estimation, is 55 to 60%. The  left ventricle has normal function. There is moderate left ventricular  hypertrophy.  3. Right ventricular systolic function is normal. The right ventricular  size is normal. There is normal pulmonary artery systolic pressure. The  estimated right ventricular systolic pressure is 68.3 mmHg.  4. The mitral valve is normal in structure. Trivial  mitral valve    regurgitation.  5. The aortic valve is tricuspid. Aortic valve regurgitation is not  visualized.   ASSESSMENT & PLAN:   PFO s/p PFO closure: doing well. Echo today shows normal LV function with negative bubble study. Continue on aspirin 81 mg daily indefinitely (listed incorrectly on med list as 325mg  daily). No longer requires SBE prophylaxis. Continue regular follow up with PCP.   Tobacco cessation: pt wishes to quit and requested nicotine patches which I have Rx;d.   Medication Adjustments/Labs and Tests Ordered: Current medicines are reviewed at length with the patient today.  Concerns regarding medicines are outlined above.  Medication changes, Labs and Tests ordered today are listed in the Patient Instructions below. Patient Instructions  Medication Instructions:  Nicotine 21 mg patches have been called in for you. *If you need a refill on your cardiac medications before your next appointment, please call your pharmacy*   Follow-Up: Please call us if you need anything!    Signed, Angelena Form, PA-C  04/23/2020 9:12 AM    [facility] [place], [place], [place]  [address] Phone: [phone]; Fax: [phone]

## 2020-04-22 NOTE — Patient Instructions (Signed)
Medication Instructions:  Nicotine 21 mg patches have been called in for you. *If you need a refill on your cardiac medications before your next appointment, please call your pharmacy*   Follow-Up: Please call us if you need anything!

## 2020-07-17 ENCOUNTER — Other Ambulatory Visit: Payer: Self-pay | Admitting: Physician Assistant

## 2020-07-19 NOTE — Telephone Encounter (Signed)
Pt's pharmacy is requesting a refill on nicotine. Would Dr. Burt Knack like to refill this medication? Please address

## 2020-08-03 ENCOUNTER — Other Ambulatory Visit: Payer: Self-pay | Admitting: Physician Assistant

## 2020-08-09 ENCOUNTER — Other Ambulatory Visit: Payer: Self-pay

## 2020-08-09 ENCOUNTER — Ambulatory Visit (HOSPITAL_COMMUNITY)
Admission: EM | Admit: 2020-08-09 | Discharge: 2020-08-09 | Disposition: A | Discharge status: Home / Self Care | Payer: Medicaid Other

## 2021-01-02 ENCOUNTER — Emergency Department (HOSPITAL_COMMUNITY)
Admission: EM | Admit: 2021-01-02 | Discharge: 2021-01-02 | Discharge status: Against Medical Advice | Payer: Medicaid Other

## 2021-01-02 ENCOUNTER — Other Ambulatory Visit: Payer: Self-pay

## 2021-01-02 NOTE — ED Notes (Signed)
Patient called x3 for triage with no response

## 2021-01-10 IMAGING — MR MRI HEAD WITHOUT CONTRAST
12 of 13 series · 44 of 48 positions shown · non-contrast
Comparison: Head CT 6288 hours today.  Brain MRI 12/26/2013.

CLINICAL DATA: 52-year-old male with episode of dizziness this
morning, visual changes.

EXAM:
MRI HEAD WITHOUT CONTRAST
TECHNIQUE: Multiplanar, multiecho pulse sequences of the brain and surrounding
structures were obtained without intravenous contrast.

[Series 5: DWI · axial · 3.0mm · 0.88mm/px · z∈[-39,+99]mm · 8 of 96 slices shown (1 of 4)]
[im 1/96]
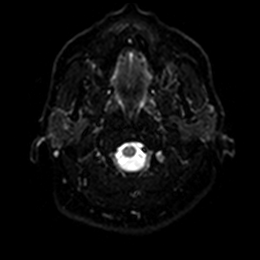
[im 14/96]
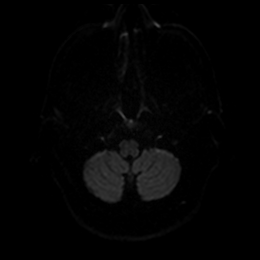
[im 28/96]
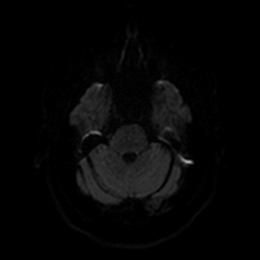
[im 41/96]
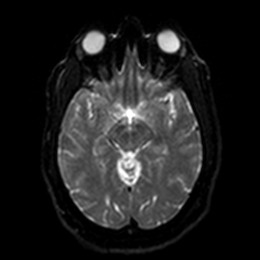
[im 55/96]
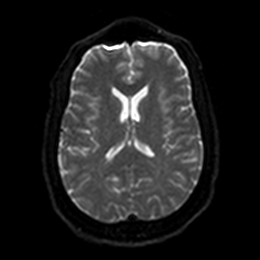
[im 68/96]
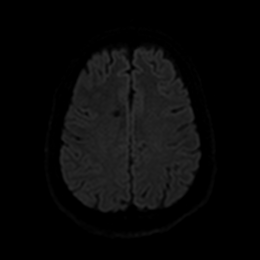
[im 82/96]
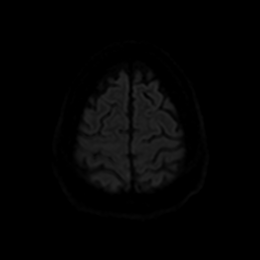
[im 96/96]
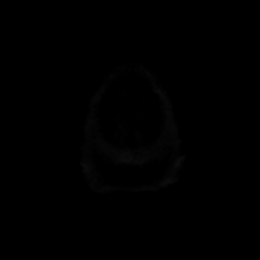

[Series 6: DWI · axial · 3.0mm · 0.88mm/px · z∈[-39,+99]mm · 4 of 48 slices shown (2 of 4)]
[im 1/48]
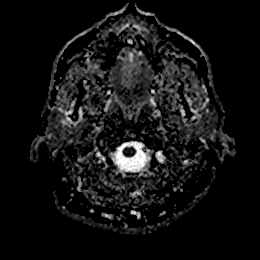
[im 16/48]
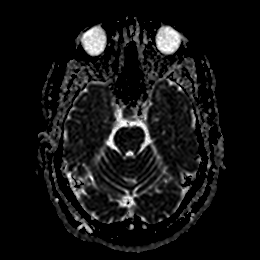
[im 32/48]
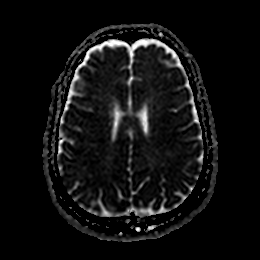
[im 48/48]
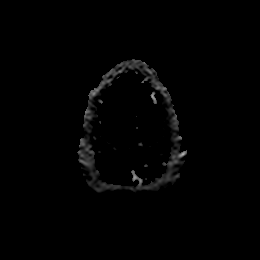

[Series 7: DWI · coronal · 4.0mm · 0.88mm/px · 5 of 72 slices shown (3 of 4)]
[im 1/72]
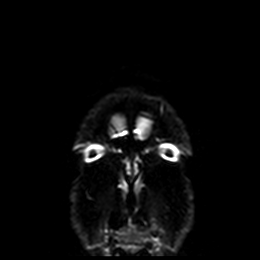
[im 18/72]
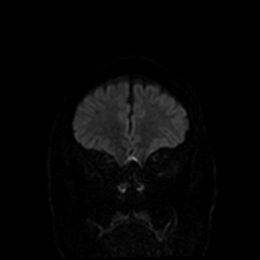
[im 36/72]
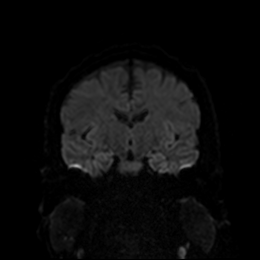
[im 54/72]
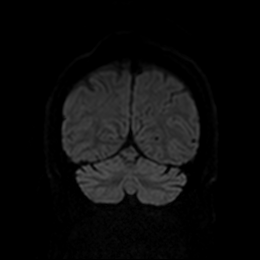
[im 72/72]
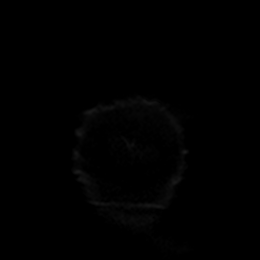

[Series 8: DWI · coronal · 4.0mm · 0.88mm/px · 3 of 36 slices shown (4 of 4)]
[im 1/36]
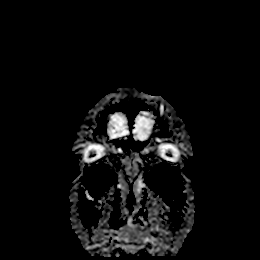
[im 18/36]
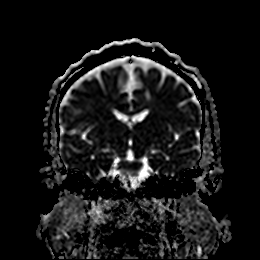
[im 36/36]
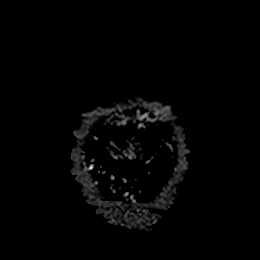

[Series 9: T1 · sagittal · 5.0mm · 0.75mm/px · 2 of 23 slices shown]
[im 1/23]
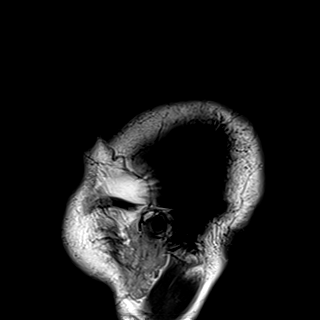
[im 23/23]
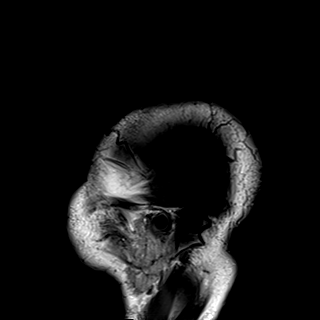

[Series 10: T2 · axial · 5.0mm · 0.72mm/px · z∈[-40,+100]mm · 2 of 25 slices shown (1 of 2)]
[im 1/25]
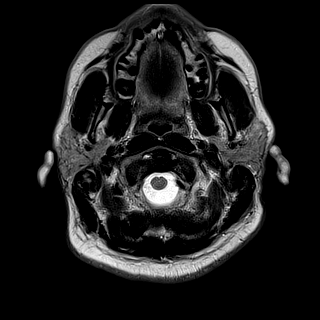
[im 25/25]
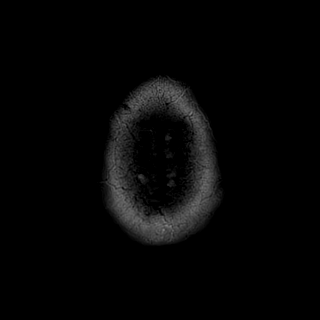

[Series 11: FLAIR · axial · 5.0mm · 0.45mm/px · z∈[-42,+99]mm · 2 of 25 slices shown]
[im 1/25]
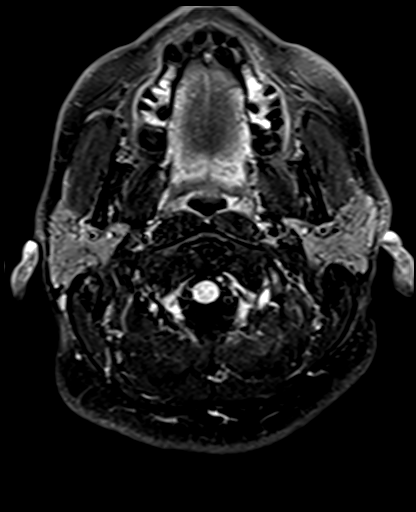
[im 25/25]
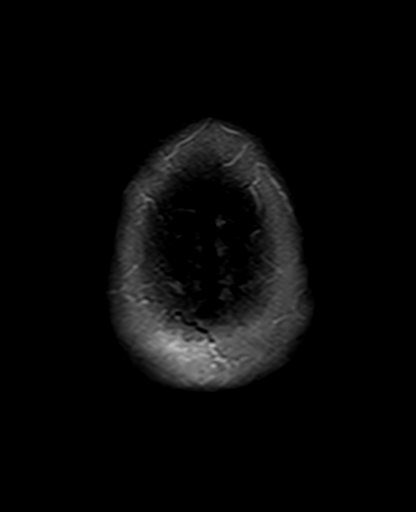

[Series 12: mag_images · axial · 3.0mm · 0.90mm/px · z∈[-52,+121]mm · 4 of 60 slices shown]
[im 1/60]
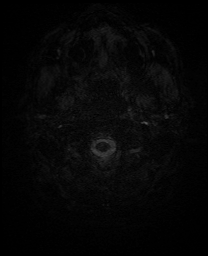
[im 20/60]
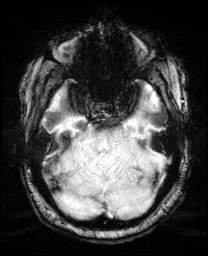
[im 40/60]
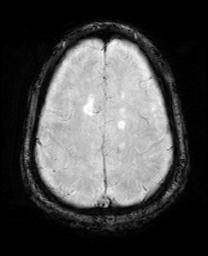
[im 60/60]
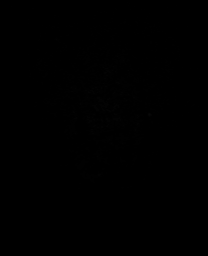

[Series 13: pha_images · axial · 3.0mm · 0.90mm/px · z∈[-52,+112]mm · 4 of 56 slices shown]
[im 1/56]
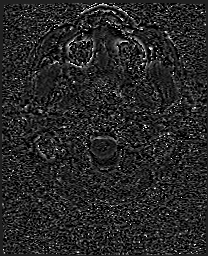
[im 19/56]
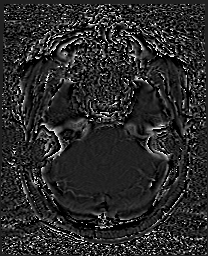
[im 37/56]
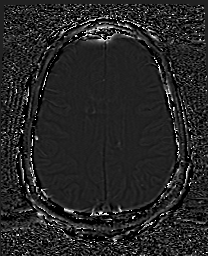
[im 56/56]
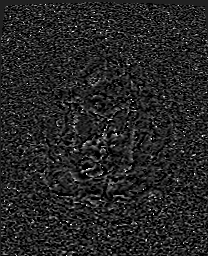

[Series 14: swi_images · axial · 3.0mm · 0.90mm/px · z∈[-52,+121]mm · 4 of 60 slices shown]
[im 1/60]
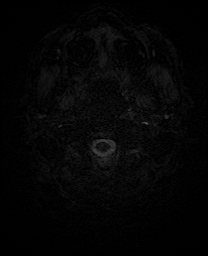
[im 20/60]
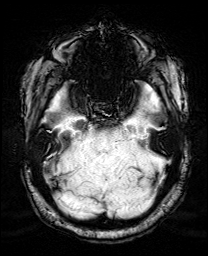
[im 40/60]
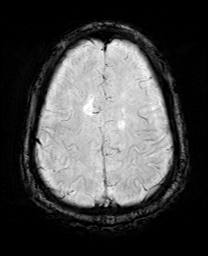
[im 60/60]
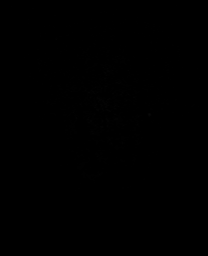

[Series 15: mip_images(sw) · axial · 24.0mm · 0.90mm/px · z∈[-41,+111]mm · 4 of 53 slices shown]
[im 1/53]
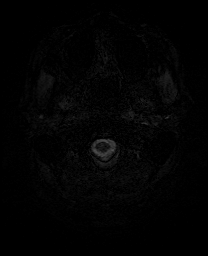
[im 18/53]
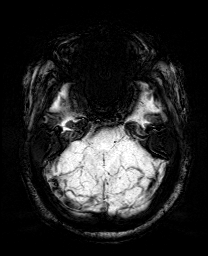
[im 35/53]
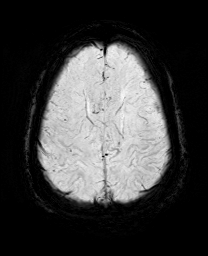
[im 53/53]
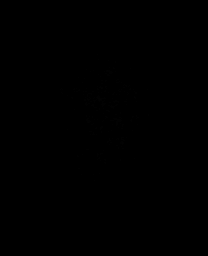

[Series 17: T2 · coronal · 5.0mm · 0.34mm/px · 2 of 29 slices shown (2 of 2)]
[im 1/29]
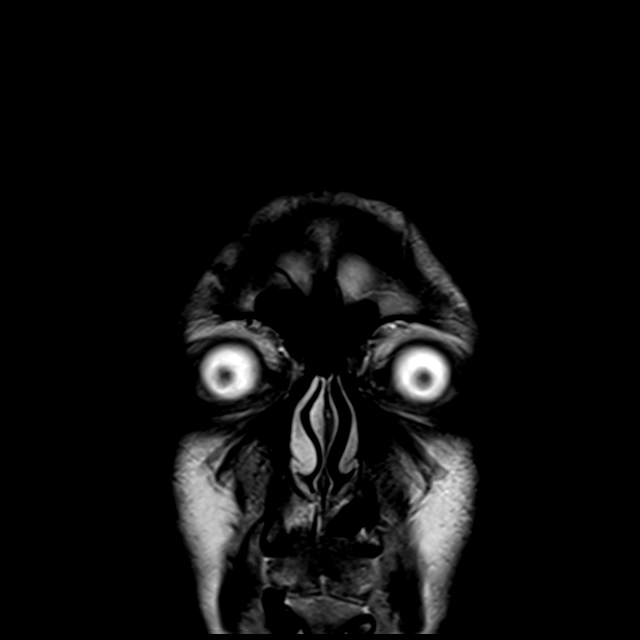
[im 29/29]
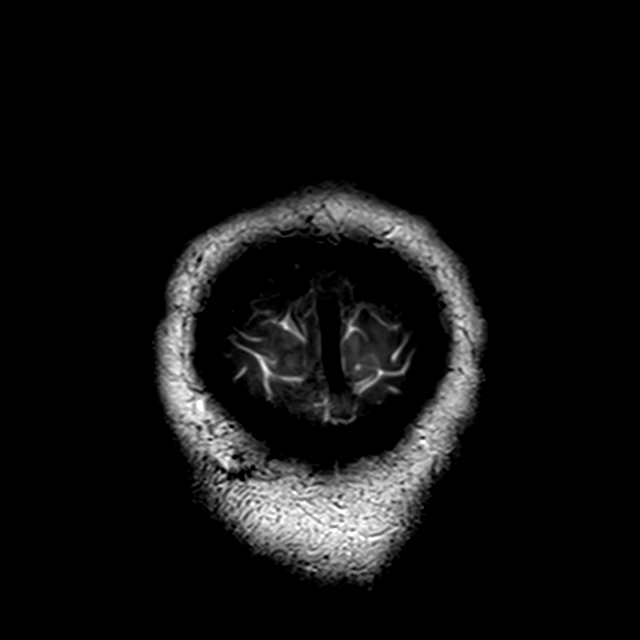

[44 of 48 positions shown; findings below may reference images not displayed]

FINDINGS: Brain: Tiny cortical area of restricted diffusion in the posterior
medial right parietal lobe is seen on both axial and coronal
diffusion weighted imaging (series 5, image 80). No other restricted
diffusion.

Chronic encephalomalacia in the right cingulate gyrus involving the
corpus callosum is new since 6303 (series 10, image 17 and series
17, image 17). Associated pericallosal T2 and FLAIR hyperintensity
in both hemispheres, new since 6303. Some of these areas have
questionable chronic hemosiderin (series 14, image 38).

Elsewhere gray and white matter signal is within normal limits. The
deep gray matter nuclei, brainstem, and cerebellum appear normal.

No midline shift, mass effect, evidence of mass lesion,
ventriculomegaly, extra-axial collection or acute intracranial
hemorrhage. Cervicomedullary junction and pituitary are within
normal limits.

Vascular: Major intracranial vascular flow voids are stable since
6303 with dominant appearing left vertebral artery.

Skull and upper cervical spine: Negative visible cervical spine.
Visualized bone marrow signal is within normal limits.

Sinuses/Orbits: Negative orbits.  Paranasal sinuses remain clear.

Other: Mastoids remain clear. Visible internal auditory structures
appear normal. Scalp and face soft tissues appear negative.
IMPRESSION: 1. Tiny cortical area of restricted diffusion in the posterior right
parietal lobe might reflect acute ischemia.
2. However, there are unusual bilateral pericallosal signal changes,
including encephalomalacia of some of the right cingulate gyrus,
which are new since a 6303 MRI which was done for hearing loss.
Consider Ourari Syndrome, with differential consideration including
other vasculitides and less likely demyelinating disease.

## 2021-10-31 ENCOUNTER — Other Ambulatory Visit: Payer: Self-pay

## 2021-10-31 ENCOUNTER — Encounter (HOSPITAL_COMMUNITY): Payer: Self-pay | Admitting: Emergency Medicine

## 2021-10-31 ENCOUNTER — Ambulatory Visit (HOSPITAL_COMMUNITY)
Admission: EM | Admit: 2021-10-31 | Discharge: 2021-10-31 | Disposition: A | Discharge status: Home / Self Care | Payer: Medicaid Other | Attending: Family Medicine | Admitting: Family Medicine

## 2021-10-31 DIAGNOSIS — K122 Cellulitis and abscess of mouth: Secondary | ICD-10-CM

## 2021-10-31 MED ORDER — IBUPROFEN 800 MG PO TABS: 800.0000 mg | ORAL_TABLET | Freq: Three times a day (TID) | ORAL | 0 refills | Status: DC | PRN

## 2021-10-31 MED ORDER — KETOROLAC TROMETHAMINE 30 MG/ML IJ SOLN
30.0000 mg | Freq: Once | INTRAMUSCULAR | Status: AC
  Administered 2021-10-31: 30 mg via INTRAMUSCULAR

## 2021-10-31 MED ORDER — KETOROLAC TROMETHAMINE 30 MG/ML IJ SOLN
INTRAMUSCULAR | Status: AC
  Filled 2021-10-31: qty 1

## 2021-10-31 MED ORDER — AMOXICILLIN-POT CLAVULANATE 875-125 MG PO TABS: 1.0000 | ORAL_TABLET | Freq: Two times a day (BID) | ORAL | 0 refills | Status: AC

## 2021-10-31 NOTE — Discharge Instructions (Addendum)
You have been given a shot of Toradol 30 mg today ? ?Take amoxicillin-clavulanate 875 mg--1 tab twice daily with food for 7 days ? ?Take ibuprofen 800 mg--1 tab every 8 hours as needed for pain.  ?

## 2021-10-31 NOTE — ED Provider Notes (Signed)
?[facility] ? ? ? ?CSN: [id number] ?Arrival date & time: 10/31/21  8850 ? ? ?  ? ?History   ?Chief Complaint ?Chief Complaint  ?Patient presents with  ? Dental Pain  ? ? ?HPI ?Zachary Powers is a 56 y.o. male.  ? ? ?Dental Pain ?Here for lower lip and jaw pain.  Began about a week ago and taken some ibuprofen had helped.  Then it worsened today and he feels swollen along his chin below his lower lip.  No fever or chills. ? ?Past Medical History:  ?Diagnosis Date  ? Anxiety   ? Asthma   ? Back pain   ? Cryptogenic stroke ([place])   ? Depression   ? GERD (gastroesophageal reflux disease)   ? Hypertension   ? PFO (patent foramen ovale)   ? ? ?Patient Active Problem List  ? Diagnosis Date Noted  ? PFO (patent foramen ovale) 03/19/2019  ? Acute CVA (cerebrovascular accident) ([place]) 01/28/2019  ? Tobacco use 01/28/2019  ? HTN (hypertension) 01/28/2019  ? Tendon rupture, traumatic, patella 04/10/2018  ? ? ?Past Surgical History:  ?Procedure Laterality Date  ? BUBBLE STUDY  03/19/2019  ? Procedure: BUBBLE STUDY;  Surgeon: Jerline Pain, MD;  Location: [facility] ENDOSCOPY;  Service: Cardiovascular;;  ? HAND SURGERY Left 1991  ? KNEE ARTHROSCOPY Left 04/10/2018  ? Procedure: Left knee arthroscopy, debridement, chondroplasty patella, possible menisectomy;  Surgeon: Susa Day, MD;  Location: [facility]S;  Service: Orthopedics;  Laterality: Left;  2 hrs for both procedures  ? PATENT FORAMEN OVALE(PFO) CLOSURE N/A 03/19/2019  ? Procedure: PATENT FORAMEN OVALE (PFO) CLOSURE;  Surgeon: Sherren Mocha, MD;  Location: [place] [facility];  Service: Cardiovascular;  Laterality: N/A;  ? QUADRICEPS TENDON REPAIR Left 04/10/2018  ? Procedure: Mini open quad tendon repair;  Surgeon: Susa Day, MD;  Location: [facility]S;  Service: Orthopedics;  Laterality: Left;  ? TEE WITHOUT CARDIOVERSION N/A 03/19/2019  ? Procedure: TRANSESOPHAGEAL ECHOCARDIOGRAM (TEE);  Surgeon: Jerline Pain, MD;  Location: [facility] ENDOSCOPY;  Service: Cardiovascular;   Laterality: N/A;  ? ? ? ? ? ?Home Medications   ? ?Prior to Admission medications   ?Medication Sig Start Date End Date Taking? Authorizing Provider  ?ibuprofen (ADVIL) 800 MG tablet Take 1 tablet (800 mg total) by mouth every 8 (eight) hours as needed (pain). 10/31/21  Yes Barrett Henle, MD  ?amoxicillin-clavulanate (AUGMENTIN) 875-125 MG tablet Take 1 tablet by mouth every 12 (twelve) hours for 7 days. 10/31/21 11/07/21  Barrett Henle, MD  ?atorvastatin (LIPITOR) 80 MG tablet Take 1 tablet (80 mg total) by mouth daily at 6 PM. 01/29/19 04/22/20  Donne Hazel, MD  ?benzonatate (TESSALON) 100 MG capsule Take 1-2 capsules (100-200 mg total) by mouth 3 (three) times daily as needed. ?Patient not taking: Reported on 10/31/2021 03/17/20   Jaynee Eagles, PA-C  ?hydrochlorothiazide (HYDRODIURIL) 25 MG tablet Take 25 mg by mouth daily.    [provider]  ?LINZESS 145 MCG CAPS capsule Take 145 mcg by mouth every morning. 03/11/20   [provider]  ?losartan (COZAAR) 100 MG tablet Take 100 mg by mouth daily.    [provider]  ?Multiple Vitamin (MULTIVITAMIN WITH MINERALS) TABS tablet Take 1 tablet by mouth daily.    [provider]  ?NICOTINE STEP 1 21 MG/24HR patch Place 1 patch (21 mg total) onto the skin daily. 07/19/20   Eileen Stanford, PA-C  ?PROAIR HFA 108 [id number] Base) MCG/ACT inhaler Inhale 2  puffs into the lungs 2 (two) times daily as needed. 03/11/20   [provider]  ?zolpidem (AMBIEN) 10 MG tablet Take 10 mg by mouth at bedtime as needed for sleep.    [provider]  ? ? ?Family History ?Family History  ?Problem Relation Age of Onset  ? Diabetes Father   ? Heart disease Father   ? Diabetes Paternal Grandmother   ? Stroke Mother   ? Colon cancer Neg Hx   ? ? ?Social History ?Social History  ? ?Tobacco Use  ? Smoking status: Every Day  ?  Packs/day: 0.50  ?  Types: Cigarettes  ? Smokeless tobacco: Never  ?Vaping Use  ? Vaping Use: Never used  ?Substance Use  Topics  ? Alcohol use: No  ?  Alcohol/week: 0.0 standard drinks  ? Drug use: No  ?  Comment: no use in 6 years  ? ? ? ?Allergies   ?Barbiturates ? ? ?Review of Systems ?Review of Systems ? ? ?Physical Exam ?Triage Vital Signs ?ED Triage Vitals  ?Enc Vitals Group  ?   BP 10/31/21 1149 (!) 158/93  ?   Pulse Rate 10/31/21 1149 72  ?   Resp 10/31/21 1149 20  ?   Temp 10/31/21 1149 98.1 ?F (36.7 ?C)  ?   Temp Source 10/31/21 1149 Oral  ?   SpO2 10/31/21 1149 95 %  ?   Weight --   ?   Height --   ?   Head Circumference --   ?   Peak Flow --   ?   Pain Score 10/31/21 1144 10  ?   Pain Loc --   ?   Pain Edu? --   ?   Excl. in [place]? --   ? ?No data found. ? ?Updated Vital Signs ?BP (!) 158/93 (BP Location: Right Arm)   Pulse 72   Temp 98.1 ?F (36.7 ?C) (Oral)   Resp 20   SpO2 95%  ? ?Visual Acuity ?Right Eye Distance:   ?Left Eye Distance:   ?Bilateral Distance:   ? ?Right Eye Near:   ?Left Eye Near:    ?Bilateral Near:    ? ?Physical Exam ?Vitals reviewed.  ?Constitutional:   ?   General: He is not in acute distress. ?   Appearance: He is not toxic-appearing.  ?HENT:  ?   Head:  ?   Comments: There is some swelling of his chin just below the lower lip left more than right. ?   Mouth/Throat:  ?   Mouth: Mucous membranes are moist.  ?   Comments: Is a swollen area inferior and lateral to the corner of the mouth on the left.  No fluctuance. ?Cardiovascular:  ?   Rate and Rhythm: Normal rate and regular rhythm.  ?Neurological:  ?   Mental Status: He is alert and oriented to person, place, and time.  ?Psychiatric:     ?   Behavior: Behavior normal.  ? ? ? ?UC Treatments / Results  ?Labs ?(all labs ordered are listed, but only abnormal results are displayed) ?Labs Reviewed - No data to display ? ?EKG ? ? ?Radiology ?No results found. ? ?Procedures ?Procedures (including critical care time) ? ?Medications Ordered in UC ?Medications  ?ketorolac (TORADOL) 30 MG/ML injection 30 mg (has no administration in time range)  ? ? ?Initial  Impression / Assessment and Plan / UC Course  ?I have reviewed the triage vital signs and the nursing notes. ? ?Pertinent labs &  imaging results that were available during my care of the patient were reviewed by me and considered in my medical decision making (see chart for details). ? ?  ? ?Oral infection most likely due to dental caries. ?Final Clinical Impressions(s) / UC Diagnoses  ? ?Final diagnoses:  ?Oral infection  ? ? ? ?Discharge Instructions   ? ?  ?You have been given a shot of Toradol 30 mg today ? ?Take amoxicillin-clavulanate 875 mg--1 tab twice daily with food for 7 days ? ?Take ibuprofen 800 mg--1 tab every 8 hours as needed for pain.  ? ? ? ? ?ED Prescriptions   ? ? Medication Sig Dispense Auth. Provider  ? amoxicillin-clavulanate (AUGMENTIN) 875-125 MG tablet Take 1 tablet by mouth every 12 (twelve) hours for 7 days. 14 tablet Latifah Padin, Gwenlyn Perking, MD  ? ibuprofen (ADVIL) 800 MG tablet Take 1 tablet (800 mg total) by mouth every 8 (eight) hours as needed (pain). 21 tablet Barrett Henle, MD  ? ?  ? ?I have reviewed the PDMP during this encounter. ?  ?Barrett Henle, MD ?10/31/21 1205 ? ?

## 2021-10-31 NOTE — ED Triage Notes (Signed)
Bottom, front tooth pain started one week ago.  Patient has been taking ibuprofen and pain eased.  Today pain resumed significantly.   ?

## 2021-11-17 ENCOUNTER — Telehealth: Payer: Self-pay

## 2021-11-17 NOTE — Telephone Encounter (Signed)
Patient walked in as to ask if the procedure he had in 2021 was MRI compatible. Let patient know I see where he had a PFO closure in 2021 by Dr. Burt Knack and that would have no impact on his MRI. No additional questions at this time. ?

## 2022-02-17 ENCOUNTER — Other Ambulatory Visit: Payer: Self-pay | Admitting: Internal Medicine

## 2022-02-18 LAB — COMPLETE METABOLIC PANEL WITH GFR
AG Ratio: 1.7 (calc) (ref 1.0–2.5)
ALT: 16 U/L (ref 9–46)
AST: 15 U/L (ref 10–35)
Albumin: 4.7 g/dL (ref 3.6–5.1)
Alkaline phosphatase (APISO): 63 U/L (ref 35–144)
BUN: 17 mg/dL (ref 7–25)
CO2: 22 mmol/L (ref 20–32)
Calcium: 9.4 mg/dL (ref 8.6–10.3)
Chloride: 101 mmol/L (ref 98–110)
Creat: 1.06 mg/dL (ref 0.70–1.30)
Globulin: 2.8 g/dL (calc) (ref 1.9–3.7)
Glucose, Bld: 84 mg/dL (ref 65–99)
Potassium: 4 mmol/L (ref 3.5–5.3)
Sodium: 136 mmol/L (ref 135–146)
Total Bilirubin: 0.7 mg/dL (ref 0.2–1.2)
Total Protein: 7.5 g/dL (ref 6.1–8.1)
eGFR: 82 mL/min/{1.73_m2} (ref >=60)

## 2022-02-18 LAB — CBC
HCT: 52.5 % — ABNORMAL HIGH (ref 38.5–50.0)
Hemoglobin: 17.5 g/dL — ABNORMAL HIGH (ref 13.2–17.1)
MCH: 27.9 pg (ref 27.0–33.0)
MCHC: 33.3 g/dL (ref 32.0–36.0)
MCV: 83.6 fL (ref 80.0–100.0)
MPV: 9.9 fL (ref 7.5–12.5)
Platelets: 264 10*3/uL (ref 140–400)
RBC: 6.28 10*6/uL — ABNORMAL HIGH (ref 4.20–5.80)
RDW: 14 % (ref 11.0–15.0)
WBC: 6.9 10*3/uL (ref 3.8–10.8)

## 2022-02-18 LAB — LIPID PANEL
Cholesterol: 195 mg/dL (ref <200)
HDL: 34 mg/dL — ABNORMAL LOW (ref >=40)
LDL Cholesterol (Calc): 121 mg/dL (calc) — ABNORMAL HIGH
Non-HDL Cholesterol (Calc): 161 mg/dL (calc) — ABNORMAL HIGH (ref <130)
Total CHOL/HDL Ratio: 5.7 (calc) — ABNORMAL HIGH (ref <5.0)
Triglycerides: 245 mg/dL — ABNORMAL HIGH (ref <150)

## 2022-02-18 LAB — TSH: TSH: 1.48 mIU/L (ref 0.40–4.50)

## 2022-02-18 LAB — PSA: PSA: 2.3 ng/mL (ref <=4.00)

## 2022-02-18 LAB — VITAMIN D 25 HYDROXY (VIT D DEFICIENCY, FRACTURES): Vit D, 25-Hydroxy: 25 ng/mL — ABNORMAL LOW (ref 30–100)

## 2022-03-21 ENCOUNTER — Ambulatory Visit
Admission: RE | Admit: 2022-03-21 | Discharge: 2022-03-21 | Disposition: A | Discharge status: Home / Self Care | Payer: Medicaid Other | Source: Ambulatory Visit | Attending: Family Medicine | Admitting: Family Medicine

## 2022-03-21 VITALS — BP 167/81 | HR 80 | Temp 98.1°F | Resp 18

## 2022-03-21 DIAGNOSIS — M25512 Pain in left shoulder: Secondary | ICD-10-CM | POA: Diagnosis not present

## 2022-03-21 DIAGNOSIS — S161XXA Strain of muscle, fascia and tendon at neck level, initial encounter: Secondary | ICD-10-CM

## 2022-03-21 MED ORDER — CYCLOBENZAPRINE HCL 10 MG PO TABS: 10.0000 mg | ORAL_TABLET | Freq: Three times a day (TID) | ORAL | 0 refills | Status: DC | PRN

## 2022-03-21 MED ORDER — NAPROXEN 500 MG PO TABS: 500.0000 mg | ORAL_TABLET | Freq: Two times a day (BID) | ORAL | 0 refills | Status: AC | PRN
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
## 2022-03-23 ENCOUNTER — Ambulatory Visit (INDEPENDENT_AMBULATORY_CARE_PROVIDER_SITE_OTHER): Payer: Medicaid Other

## 2022-03-23 ENCOUNTER — Encounter (HOSPITAL_COMMUNITY): Payer: Self-pay

## 2022-03-23 ENCOUNTER — Ambulatory Visit (HOSPITAL_COMMUNITY)
Admission: EM | Admit: 2022-03-23 | Discharge: 2022-03-23 | Disposition: A | Discharge status: Home / Self Care | Payer: Medicaid Other | Attending: Family | Admitting: Family

## 2022-03-23 DIAGNOSIS — S46912D Strain of unspecified muscle, fascia and tendon at shoulder and upper arm level, left arm, subsequent encounter: Secondary | ICD-10-CM | POA: Diagnosis not present

## 2022-03-23 DIAGNOSIS — S40811A Abrasion of right upper arm, initial encounter: Secondary | ICD-10-CM | POA: Diagnosis not present

## 2022-03-23 DIAGNOSIS — M545 Low back pain, unspecified: Secondary | ICD-10-CM

## 2022-03-23 DIAGNOSIS — T148XXA Other injury of unspecified body region, initial encounter: Secondary | ICD-10-CM

## 2022-03-23 MED ORDER — TIZANIDINE HCL 4 MG PO TABS: 4.0000 mg | ORAL_TABLET | Freq: Three times a day (TID) | ORAL | 0 refills | Status: DC | PRN
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
## 2022-03-23 NOTE — Discharge Instructions (Addendum)
Recommend continue Naproxen '500mg'$  every 12 hours as needed for pain or inflammation or may take OTC Ibuprofen '800mg'$  every 8 hours as needed. Switch to Zanaflex '4mg'$  every 8 hours as needed for muscle pain/spasms- may still cause drowsiness. Do not take Flexeril if taking Zanaflex. Apply warm compresses to area for comfort. Follow-up with your PCP next week if symptoms do not improve.

## 2022-04-03 ENCOUNTER — Ambulatory Visit
Admission: RE | Admit: 2022-04-03 | Discharge: 2022-04-03 | Disposition: A | Discharge status: Home / Self Care | Payer: Medicaid Other | Source: Ambulatory Visit | Attending: Family Medicine | Admitting: Family Medicine

## 2022-04-03 VITALS — BP 144/82 | HR 88 | Temp 97.8°F | Resp 20

## 2022-04-03 DIAGNOSIS — S161XXD Strain of muscle, fascia and tendon at neck level, subsequent encounter: Secondary | ICD-10-CM

## 2022-04-03 DIAGNOSIS — S39012D Strain of muscle, fascia and tendon of lower back, subsequent encounter: Secondary | ICD-10-CM

## 2022-04-03 MED ORDER — TIZANIDINE HCL 4 MG PO TABS: 4.0000 mg | ORAL_TABLET | Freq: Three times a day (TID) | ORAL | 0 refills | Status: AC | PRN
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
## 2022-09-08 ENCOUNTER — Other Ambulatory Visit: Payer: Self-pay | Admitting: Internal Medicine

## 2022-09-09 LAB — URINE CULTURE
MICRO NUMBER:: 14545086
SPECIMEN QUALITY:: ADEQUATE

## 2023-01-26 ENCOUNTER — Other Ambulatory Visit: Payer: Self-pay | Admitting: Internal Medicine

## 2023-01-27 LAB — PSA: PSA: 9.48 ng/mL — ABNORMAL HIGH (ref <=4.00)

## 2023-01-27 LAB — CBC
HCT: 48.8 % (ref 38.5–50.0)
Hemoglobin: 15.8 g/dL (ref 13.2–17.1)
MCH: 27.1 pg (ref 27.0–33.0)
MCHC: 32.4 g/dL (ref 32.0–36.0)
MCV: 83.6 fL (ref 80.0–100.0)
MPV: 9.8 fL (ref 7.5–12.5)
Platelets: 228 10*3/uL (ref 140–400)
RBC: 5.84 10*6/uL — ABNORMAL HIGH (ref 4.20–5.80)
RDW: 13.6 % (ref 11.0–15.0)
WBC: 7.6 10*3/uL (ref 3.8–10.8)

## 2023-01-27 LAB — COMPLETE METABOLIC PANEL WITH GFR
AG Ratio: 1.6 (calc) (ref 1.0–2.5)
ALT: 14 U/L (ref 9–46)
AST: 15 U/L (ref 10–35)
Albumin: 4.4 g/dL (ref 3.6–5.1)
Alkaline phosphatase (APISO): 61 U/L (ref 35–144)
BUN: 10 mg/dL (ref 7–25)
CO2: 22 mmol/L (ref 20–32)
Calcium: 9.7 mg/dL (ref 8.6–10.3)
Chloride: 102 mmol/L (ref 98–110)
Creat: 0.86 mg/dL (ref 0.70–1.30)
Globulin: 2.8 g/dL (calc) (ref 1.9–3.7)
Glucose, Bld: 101 mg/dL — ABNORMAL HIGH (ref 65–99)
Potassium: 4.1 mmol/L (ref 3.5–5.3)
Sodium: 137 mmol/L (ref 135–146)
Total Bilirubin: 0.5 mg/dL (ref 0.2–1.2)
Total Protein: 7.2 g/dL (ref 6.1–8.1)
eGFR: 101 mL/min/{1.73_m2} (ref >=60)

## 2023-01-27 LAB — LIPID PANEL
Cholesterol: 200 mg/dL — ABNORMAL HIGH (ref <200)
HDL: 36 mg/dL — ABNORMAL LOW (ref >=40)
LDL Cholesterol (Calc): 128 mg/dL (calc) — ABNORMAL HIGH
Non-HDL Cholesterol (Calc): 164 mg/dL (calc) — ABNORMAL HIGH (ref <130)
Total CHOL/HDL Ratio: 5.6 (calc) — ABNORMAL HIGH (ref <5.0)
Triglycerides: 217 mg/dL — ABNORMAL HIGH (ref <150)

## 2023-01-27 LAB — URINE CULTURE
MICRO NUMBER:: 15140794
Result:: NO GROWTH
SPECIMEN QUALITY:: ADEQUATE

## 2023-01-27 LAB — VITAMIN D 25 HYDROXY (VIT D DEFICIENCY, FRACTURES): Vit D, 25-Hydroxy: 38 ng/mL (ref 30–100)

## 2023-01-27 LAB — TSH: TSH: 2 mIU/L (ref 0.40–4.50)

## 2023-11-12 ENCOUNTER — Encounter (HOSPITAL_BASED_OUTPATIENT_CLINIC_OR_DEPARTMENT_OTHER): Payer: Self-pay | Admitting: Emergency Medicine

## 2023-11-12 ENCOUNTER — Emergency Department (HOSPITAL_BASED_OUTPATIENT_CLINIC_OR_DEPARTMENT_OTHER): Admitting: Radiology

## 2023-11-12 DIAGNOSIS — Z79899 Other long term (current) drug therapy: Secondary | ICD-10-CM | POA: Diagnosis not present

## 2023-11-12 DIAGNOSIS — I10 Essential (primary) hypertension: Secondary | ICD-10-CM | POA: Diagnosis not present

## 2023-11-12 DIAGNOSIS — J9801 Acute bronchospasm: Secondary | ICD-10-CM | POA: Diagnosis not present

## 2023-11-12 DIAGNOSIS — J069 Acute upper respiratory infection, unspecified: Secondary | ICD-10-CM | POA: Diagnosis not present

## 2023-11-12 DIAGNOSIS — R059 Cough, unspecified: Secondary | ICD-10-CM | POA: Diagnosis present

## 2023-11-12 LAB — RESP PANEL BY RT-PCR (RSV, FLU A&B, COVID)  RVPGX2
Influenza A by PCR: NEGATIVE
Influenza B by PCR: NEGATIVE
Resp Syncytial Virus by PCR: NEGATIVE
SARS Coronavirus 2 by RT PCR: NEGATIVE

## 2023-11-12 NOTE — ED Triage Notes (Signed)
 Cough since last night Sob and chest pains when coughing

## 2023-11-13 ENCOUNTER — Emergency Department (HOSPITAL_BASED_OUTPATIENT_CLINIC_OR_DEPARTMENT_OTHER)
Admission: EM | Admit: 2023-11-13 | Discharge: 2023-11-13 | Disposition: A | Discharge status: Home / Self Care | Attending: Emergency Medicine | Admitting: Emergency Medicine

## 2023-11-13 ENCOUNTER — Other Ambulatory Visit: Payer: Self-pay

## 2023-11-13 DIAGNOSIS — J9801 Acute bronchospasm: Secondary | ICD-10-CM

## 2023-11-13 DIAGNOSIS — J069 Acute upper respiratory infection, unspecified: Secondary | ICD-10-CM

## 2023-11-13 DIAGNOSIS — I10 Essential (primary) hypertension: Secondary | ICD-10-CM

## 2023-11-13 MED ORDER — ALBUTEROL SULFATE HFA 108 (90 BASE) MCG/ACT IN AERS
2.0000 | INHALATION_SPRAY | RESPIRATORY_TRACT | Status: DC | PRN
  Administered 2023-11-13: 2 via RESPIRATORY_TRACT
  Filled 2023-11-13: qty 6.7

## 2023-11-13 MED ORDER — PREDNISONE 50 MG PO TABS: 50.0000 mg | ORAL_TABLET | Freq: Every day | ORAL | 0 refills | Status: AC

## 2023-11-13 MED ORDER — PROAIR HFA 108 (90 BASE) MCG/ACT IN AERS: 2.0000 | INHALATION_SPRAY | Freq: Two times a day (BID) | RESPIRATORY_TRACT | 0 refills | Status: AC | PRN

## 2023-11-13 MED ORDER — ACETAMINOPHEN 325 MG PO TABS
650.0000 mg | ORAL_TABLET | Freq: Once | ORAL | Status: AC
  Administered 2023-11-13: 650 mg via ORAL
  Filled 2023-11-13: qty 2

## 2023-11-13 MED ORDER — IPRATROPIUM-ALBUTEROL 0.5-2.5 (3) MG/3ML IN SOLN
3.0000 mL | Freq: Once | RESPIRATORY_TRACT | Status: AC
  Administered 2023-11-13: 3 mL via RESPIRATORY_TRACT
  Filled 2023-11-13: qty 3

## 2023-11-13 MED ORDER — PREDNISONE 50 MG PO TABS
60.0000 mg | ORAL_TABLET | Freq: Once | ORAL | Status: AC
  Administered 2023-11-13: 60 mg via ORAL
  Filled 2023-11-13: qty 1

## 2023-11-13 NOTE — ED Notes (Signed)
 Patient provided with inhaler and spacer. Patient acknowledges understanding of use of medication.

## 2023-11-13 NOTE — ED Provider Notes (Signed)
 [place] EMERGENCY DEPARTMENT AT [facility] Provider Note   CSN: [id number] Arrival date & time: 11/12/23  2104     History  Chief Complaint  Patient presents with   Cough    Zachary Powers is a 58 y.o. male.  The history is provided by the patient.  Cough He has history of hypertension, patent foramen ovale, cryptogenic stroke and complains of cough which started yesterday.  Cough is initially nonproductive but today he started bringing up some brown sputum.  He has had some chills but denies fever or sweats.  He endorses some soreness in his chest from coughing as well as some generalized bodyaches.  He denies any sick contacts.  He is a cigarette smoker although he states that he has been cutting back on the amount that he smokes.  He endorses some shortness of breath today.    Home Medications Prior to Admission medications   Medication Sig Start Date End Date Taking? Authorizing Provider  predniSONE (DELTASONE) 50 MG tablet Take 1 tablet (50 mg total) by mouth daily. 11/13/23  Yes Alissa April, MD  atorvastatin (LIPITOR) 80 MG tablet Take 1 tablet (80 mg total) by mouth daily at 6 PM. 01/29/19 04/22/20  Oral Billings, MD  hydrochlorothiazide (HYDRODIURIL) 25 MG tablet Take 25 mg by mouth daily.    [provider]  ibuprofen (ADVIL) 800 MG tablet Take 1 tablet (800 mg total) by mouth every 8 (eight) hours as needed (pain). 10/31/21   Banister, Pamela K, MD  LINZESS 145 MCG CAPS capsule Take 145 mcg by mouth every morning. 03/11/20   [provider]  losartan (COZAAR) 100 MG tablet Take 100 mg by mouth daily.    [provider]  Multiple Vitamin (MULTIVITAMIN WITH MINERALS) TABS tablet Take 1 tablet by mouth daily.    [provider]  naproxen (NAPROSYN) 500 MG tablet Take 1 tablet (500 mg total) by mouth 2 (two) times daily as needed. 03/21/22   Corbin Dess, PA-C  NICOTINE STEP 1 21 MG/24HR patch Place 1 patch (21 mg total) onto  the skin daily. 07/19/20   Ardia Kraft, PA-C  PROAIR HFA 108 (90 Base) MCG/ACT inhaler Inhale 2 puffs into the lungs 2 (two) times daily as needed. 11/13/23   Alissa April, MD  tiZANidine (ZANAFLEX) 4 MG tablet Take 1 tablet (4 mg total) by mouth every 8 (eight) hours as needed for muscle spasms (or muscle pain). 04/03/22   Corbin Dess, PA-C  zolpidem (AMBIEN) 10 MG tablet Take 10 mg by mouth at bedtime as needed for sleep.    [provider]      Allergies    Barbiturates    Review of Systems   Review of Systems  Respiratory:  Positive for cough.   All other systems reviewed and are negative.   Physical Exam Updated Vital Signs BP (!) 187/98   Pulse (!) 102   Temp 100 F (37.8 C)   Resp 20   SpO2 98%  Physical Exam Vitals and nursing note reviewed.   58 year old male, resting comfortably and in no acute distress. Vital signs are significant for elevated blood pressure and slightly elevated heart rate. Oxygen saturation is 96%, which is normal. Head is normocephalic and atraumatic. PERRLA, EOMI. Oropharynx is clear. Neck is nontender and supple without adenopathy or JVD. Back is nontender and there is no CVA tenderness. Lungs are clear without rales, wheezes, or rhonchi.  There is a prolonged  exhalation phase.  With forced exhalation, considerable wheezing is noted. Chest is nontender. Heart has regular rate and rhythm without murmur. Abdomen is soft, flat, nontender. Neurologic: Mental status is normal, moves all extremities equally.  ED Results / Procedures / Treatments   Labs (all labs ordered are listed, but only abnormal results are displayed) Labs Reviewed  RESP PANEL BY RT-PCR (RSV, FLU A&B, COVID)  [id number]    EKG EKG Interpretation Date/Time:  Monday November 12 2023 21:19:01 EDT Ventricular Rate:  109 PR Interval:  176 QRS Duration:  76 QT Interval:  304 QTC Calculation: 409 R Axis:   59  Text Interpretation: Sinus tachycardia  Otherwise normal ECG When compared with ECG of 19-Mar-2019 13:52, Vent. rate has increased BY  38 BPM Non-specific change in ST segment in Lateral leads T wave inversion now evident in Lateral leads Confirmed by Dione Booze ([id number]) on 11/13/2023 1:37:00 AM  Radiology DG Chest 2 View Result Date: 11/12/2023 CLINICAL DATA:  Shortness of breath and chest pain, initial encounter EXAM: CHEST - 2 VIEW COMPARISON:  07/17/2015 FINDINGS: Cardiac shadow is within normal limits. PFO closure device is noted new from the prior exam. Lungs are well aerated bilaterally without focal infiltrate or sizable effusion. No bony abnormality is noted. IMPRESSION: No active cardiopulmonary disease. Electronically Signed   By: Alcide Clever M.D.   On: 11/12/2023 22:07    Procedures Procedures    Medications Ordered in ED Medications  albuterol (VENTOLIN HFA) 108 (90 Base) MCG/ACT inhaler 2 puff (has no administration in time range)  predniSONE (DELTASONE) tablet 60 mg (has no administration in time range)  ipratropium-albuterol (DUONEB) 0.5-2.5 (3) MG/3ML nebulizer solution 3 mL (3 mLs Nebulization Given 11/13/23 0206)  acetaminophen (TYLENOL) tablet 650 mg (650 mg Oral Given 11/13/23 0252)    ED Course/ Medical Decision Making/ A&P                                 Medical Decision Making Amount and/or Complexity of Data Reviewed Radiology: ordered.  Risk OTC drugs. Prescription drug management.   Respiratory tract infection most likely viral, rule out pneumonia.  Chest x-ray shows no evidence of pneumonia.  I have independently viewed the images, and agree with the radiologist's interpretation.  I have reviewed his electrocardiogram, and my interpretation is nonspecific ST and T changes.  I have reviewed her laboratory tests, and my interpretation is negative PCR for COVID-19 and influenza and RSV.  I have ordered a nebulizer treatment with albuterol and ipratropium.  3:18 AM Following above-noted treatment,  he states he is feeling much better.  On reexam, there is improved airflow.  I have ordered a dose of prednisone and I am discharging him with an albuterol inhaler and a 5-day course of prednisone.  I have counseled him on the need to stop smoking and provided resources to help him stop smoking.  Final Clinical Impression(s) / ED Diagnoses Final diagnoses:  Viral URI with cough  Bronchospasm  Elevated blood pressure reading with diagnosis of hypertension    Rx / DC Orders ED Discharge Orders          Ordered    PROAIR HFA 108 (90 Base) MCG/ACT inhaler  2 times daily PRN        11/13/23 0314    predniSONE (DELTASONE) 50 MG tablet  Daily        11/13/23 0314  Alissa April, MD 11/13/23 [phone]

## 2024-01-01 ENCOUNTER — Other Ambulatory Visit: Payer: Self-pay | Admitting: Urology

## 2024-01-02 LAB — PSA: Prostate Specific Ag, Serum: 2.8 ng/mL (ref 0.0–4.0)

## 2024-01-28 ENCOUNTER — Emergency Department (HOSPITAL_BASED_OUTPATIENT_CLINIC_OR_DEPARTMENT_OTHER)
Admission: EM | Admit: 2024-01-28 | Discharge: 2024-01-29 | Disposition: A | Discharge status: Home / Self Care | Attending: Emergency Medicine | Admitting: Emergency Medicine

## 2024-01-28 ENCOUNTER — Other Ambulatory Visit: Payer: Self-pay

## 2024-01-28 DIAGNOSIS — K0889 Other specified disorders of teeth and supporting structures: Secondary | ICD-10-CM

## 2024-01-28 DIAGNOSIS — K029 Dental caries, unspecified: Secondary | ICD-10-CM | POA: Insufficient documentation

## 2024-01-28 MED ORDER — AMOXICILLIN 500 MG PO CAPS
1000.0000 mg | ORAL_CAPSULE | Freq: Once | ORAL | Status: AC
  Administered 2024-01-29: 1000 mg via ORAL
  Filled 2024-01-28: qty 2

## 2024-01-28 MED ORDER — KETOROLAC TROMETHAMINE 60 MG/2ML IM SOLN
60.0000 mg | Freq: Once | INTRAMUSCULAR | Status: AC
Start: 2024-01-29 — End: 2024-02-02
  Administered 2024-01-29: 60 mg via INTRAMUSCULAR
  Filled 2024-01-28: qty 2

## 2024-01-28 MED ORDER — BUPIVACAINE-EPINEPHRINE (PF) 0.5% -1:200000 IJ SOLN: 10.0000 mL | Freq: Once | INTRAMUSCULAR | Status: DC

## 2024-01-28 NOTE — ED Provider Notes (Signed)
 [place] EMERGENCY DEPARTMENT AT [facility] Provider Note   CSN: [id number] Arrival date & time: 01/28/24  2232     Patient presents with: Dental Pain   Zachary Powers is a 58 y.o. male.  {Add pertinent medical, surgical, social history, OB history to YEP:67052} Patient presents to the emergency department for evaluation of toothache.  Patient reports that he has been having severe pain all day today from a tooth on the right lower side of his mouth.  He does not have a dentist.  He tried Tylenol  at home without improvement.       Prior to Admission medications   Medication Sig Start Date End Date Taking? Authorizing Provider  atorvastatin  (LIPITOR ) 80 MG tablet Take 1 tablet (80 mg total) by mouth daily at 6 PM. 01/29/19 04/22/20  Cindy Garnette POUR, MD  hydrochlorothiazide  (HYDRODIURIL ) 25 MG tablet Take 25 mg by mouth daily.    [provider]  ibuprofen  (ADVIL ) 800 MG tablet Take 1 tablet (800 mg total) by mouth every 8 (eight) hours as needed (pain). 10/31/21   Banister, Pamela K, MD  LINZESS 145 MCG CAPS capsule Take 145 mcg by mouth every morning. 03/11/20   [provider]  losartan  (COZAAR ) 100 MG tablet Take 100 mg by mouth daily.    [provider]  Multiple Vitamin (MULTIVITAMIN WITH MINERALS) TABS tablet Take 1 tablet by mouth daily.    [provider]  naproxen  (NAPROSYN ) 500 MG tablet Take 1 tablet (500 mg total) by mouth 2 (two) times daily as needed. 03/21/22   Stuart Vernell Norris, PA-C  NICOTINE  STEP 1 21 MG/24HR patch Place 1 patch (21 mg total) onto the skin daily. 07/19/20   Sebastian Lamarr SAUNDERS, PA-C  predniSONE  (DELTASONE ) 50 MG tablet Take 1 tablet (50 mg total) by mouth daily. 11/13/23   Raford Lenis, MD  PROAIR  HFA 108 (90 Base) MCG/ACT inhaler Inhale 2 puffs into the lungs 2 (two) times daily as needed. 11/13/23   Raford Lenis, MD  tiZANidine  (ZANAFLEX ) 4 MG tablet Take 1 tablet (4 mg total) by mouth every 8 (eight) hours  as needed for muscle spasms (or muscle pain). 04/03/22   Stuart Vernell Norris, PA-C  zolpidem  (AMBIEN ) 10 MG tablet Take 10 mg by mouth at bedtime as needed for sleep.    [provider]    Allergies: Barbiturates    Review of Systems  Updated Vital Signs BP (!) 182/80 (BP Location: Right Arm)   Pulse 75   Temp 98.5 F (36.9 C)   Resp 17   SpO2 93%   Physical Exam Vitals and nursing note reviewed.  Constitutional:      General: He is not in acute distress.    Appearance: He is well-developed.  HENT:     Head: Normocephalic and atraumatic.     Mouth/Throat:     Mouth: Mucous membranes are moist.     Dentition: Dental tenderness and dental caries present. No gingival swelling or dental abscesses.    Eyes:     General: Vision grossly intact. Gaze aligned appropriately.     Extraocular Movements: Extraocular movements intact.     Conjunctiva/sclera: Conjunctivae normal.    Cardiovascular:     Rate and Rhythm: Normal rate and regular rhythm.     Pulses: Normal pulses.     Heart sounds: Normal heart sounds, S1 normal and S2 normal. No murmur heard.    No friction rub. No gallop.  Pulmonary:  Effort: Pulmonary effort is normal. No respiratory distress.     Breath sounds: Normal breath sounds.  Abdominal:     Palpations: Abdomen is soft.     Tenderness: There is no abdominal tenderness. There is no guarding or rebound.     Hernia: No hernia is present.   Musculoskeletal:        General: No swelling.     Cervical back: Full passive range of motion without pain, normal range of motion and neck supple. No pain with movement, spinous process tenderness or muscular tenderness. Normal range of motion.     Right lower leg: No edema.     Left lower leg: No edema.   Skin:    General: Skin is warm and dry.     Capillary Refill: Capillary refill takes less than 2 seconds.     Findings: No ecchymosis, erythema, lesion or wound.   Neurological:     Mental Status: He  is alert and oriented to person, place, and time.     GCS: GCS eye subscore is 4. GCS verbal subscore is 5. GCS motor subscore is 6.     Cranial Nerves: Cranial nerves 2-12 are intact.     Sensory: Sensation is intact.     Motor: Motor function is intact. No weakness or abnormal muscle tone.     Coordination: Coordination is intact.   Psychiatric:        Mood and Affect: Mood normal.        Speech: Speech normal.        Behavior: Behavior normal.     (all labs ordered are listed, but only abnormal results are displayed) Labs Reviewed - No data to display  EKG: None  Radiology: No results found.  {Document cardiac monitor, telemetry assessment procedure when appropriate:32947} Procedures   Medications Ordered in the ED  bupivacaine -epinephrine  (PF) (MARCAINE  W/ EPI) 0.5% -1:200000 injection 10 mL (has no administration in time range)  amoxicillin  (AMOXIL ) capsule 1,000 mg (has no administration in time range)  ketorolac  (TORADOL ) injection 60 mg (has no administration in time range)      {Click here for ABCD2, HEART and other calculators REFRESH Note before signing:1}                              Medical Decision Making Risk Prescription drug management.   ***  {Document critical care time when appropriate  Document review of labs and clinical decision tools ie CHADS2VASC2, etc  Document your independent review of radiology images and any outside records  Document your discussion with family members, caretakers and with consultants  Document social determinants of health affecting pt's care  Document your decision making why or why not admission, treatments were needed:32947:::1}   Final diagnoses:  None    ED Discharge Orders     None

## 2024-01-28 NOTE — ED Triage Notes (Signed)
 Patient states right lower tooth pain for the past day. Does not have dentist appointment. Taken tylenol  without relief.

## 2024-01-29 MED ORDER — AMOXICILLIN 500 MG PO CAPS: 500.0000 mg | ORAL_CAPSULE | Freq: Three times a day (TID) | ORAL | 0 refills | Status: DC

## 2024-01-29 MED ORDER — IBUPROFEN 800 MG PO TABS: 800.0000 mg | ORAL_TABLET | Freq: Four times a day (QID) | ORAL | 0 refills | Status: DC | PRN

## 2024-01-29 NOTE — Progress Notes (Unsigned)
 Cardiology Office Note   Date:  01/30/2024  ID:  Zachary Powers, DOB 1966/05/01, MRN [id number] PCP: Shelda Atlas, MD  [facility] Providers Cardiologist:  Ozell Fell, MD   History of Present Illness Zachary Powers is a 58 y.o. male with a past medical history of hypertension, intracranial stenosis, cryptogenic CVA, history of PFO s/p PFO closure in 03/2019.  Patient presents today to establish care with general cardiology  Patient previously had CVA in 12/2018.  Underwent echocardiogram 01/28/2019 that showed EF 60-65%, moderately increased LV wall thickness, normal RV systolic function, no significant valvular abnormalities.  Underwent TEE in 8/28 that showed EF 55 to 60%, no wall motion abnormalities, normal RV systolic function, no evidence of thrombus in the left atrial appendage, small amount of bubble contrast rest over right to left consistent with small PFO.  Patient was referred to Dr. Fell, underwent PFO closure on 03/19/2019.  Follow-up echo 04/22/2020 showed no residual flow seen by color-flow Doppler, agitated saline contrast bubble study negative with no evidence of intra or atrial shunt.  EF 55-60%, moderate LVH, normal RV systolic function, no significant valvular abnormalities.  Patient has not been seen by the structural heart team since 03/2020, has never been seen by general cardiology  Today, patient presents to establish care with general cardiology.  He reports that he is a bit confused as to why he was referred to cardiology.  He has been trying to increase his physical activity and go to the gym more.  Reports that he occasionally gets short of breath when exercising.  He started off trying to walk for longer distances and now he runs on the treadmill.  Estimates that he is able to walk/run on the treadmill for about 20 minutes before he gets short of breath.  His breathing does recover well with the rest.  He denies having any chest pain with exertion.  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  Reports that he is followed by wellness clinic through [facility] and he told a provider there that he occasionally got short of breath when exercising, he thinks that that was why he was referred.  He denies cough, orthopnea, lower extremity swelling.  Denies syncope, near syncope.  He continues to smoke but has been trying to quit for a long time. He has cut back from 1 pack per day to about 5 cigarettes per day   Studies Reviewed  Cardiac Studies & Procedures   ______________________________________________________________________________________________ CARDIAC CATHETERIZATION  CARDIAC CATHETERIZATION 03/19/2019  Conclusion Successful transcatheter PFO closure using an 18 mm Amplatzer PFO occluder with intracardiac echo and fluoroscopic guidance     ECHOCARDIOGRAM  ECHOCARDIOGRAM LIMITED BUBBLE STUDY 04/22/2020  Narrative ECHOCARDIOGRAM LIMITED REPORT    Patient Name:   Zachary Powers Date of Exam: 04/22/2020 Medical Rec #:  [id number]     Height:       69.0 in Accession #:    [id number]    Weight:       223.8 lb Date of Birth:  [birth year]    BSA:          2.167 m Patient Age:    53 years      BP:           154/92 mmHg Patient Gender: M             HR:           66 bpm. Exam Location:  [facility]  [address]  Procedure: Limited Echo, Cardiac Doppler, Limited Color Doppler and Saline Contrast Bubble Study  Indications:    Q21.1 PFO  History:        Patient has prior history of Echocardiogram examinations, most recent 03/18/2019. CVA, Signs/Symptoms:Shortness of Breath; Risk Factors:Hypertension and Current Smoker. PFO closure 03/19/2019.  Sonographer:    Zachary Powers Referring Phys: [phone] Zachary Powers  IMPRESSIONS   1. S/p PFO closure with 18 mm Amplatzer atrial septal occluder device. No residual flow seen by color flow Doppler. Agitated saline contrast bubble study was negative, with no evidence of any  interatrial shunt. 2. Left ventricular ejection fraction, by estimation, is 55 to 60%. The left ventricle has normal function. There is moderate left ventricular hypertrophy. 3. Right ventricular systolic function is normal. The right ventricular size is normal. There is normal pulmonary artery systolic pressure. The estimated right ventricular systolic pressure is 22.9 mmHg. 4. The mitral valve is normal in structure. Trivial mitral valve regurgitation. 5. The aortic valve is tricuspid. Aortic valve regurgitation is not visualized.  FINDINGS Left Ventricle: Left ventricular ejection fraction, by estimation, is 55 to 60%. The left ventricle has normal function. There is moderate left ventricular hypertrophy.  Right Ventricle: The right ventricular size is normal. No increase in right ventricular wall thickness. Right ventricular systolic function is normal. There is normal pulmonary artery systolic pressure. The tricuspid regurgitant velocity is 2.23 m/s, and with an assumed right atrial pressure of 3 mmHg, the estimated right ventricular systolic pressure is 22.9 mmHg.  Mitral Valve: The mitral valve is normal in structure. Trivial mitral valve regurgitation.  Tricuspid Valve: The tricuspid valve is normal in structure. Tricuspid valve regurgitation is trivial.  Aortic Valve: The aortic valve is tricuspid. Aortic valve regurgitation is not visualized.  Pulmonic Valve: The pulmonic valve was normal in structure. Pulmonic valve regurgitation is mild.  IAS/Shunts: No atrial level shunt detected by color flow Doppler. Agitated saline contrast was given intravenously to evaluate for intracardiac shunting. Agitated saline contrast bubble study was negative, with no evidence of any interatrial shunt.  LEFT VENTRICLE PLAX 2D LVIDd:         4.20 cm LVIDs:         2.90 cm LV PW:         1.70 cm LV IVS:        1.50 cm   RIGHT VENTRICLE RVSP:           22.9 mmHg  LEFT ATRIUM         Index       RIGHT ATRIUM LA diam:    3.90 cm 1.80 cm/m RA Pressure: 3.00 mmHg  AORTA Ao Root diam: 3.60 cm Ao Asc diam:  3.30 cm  MITRAL VALVE               TRICUSPID VALVE MV Area (PHT):             TR Peak grad:   19.9 mmHg MV Decel Time:             TR Vmax:        223.00 cm/s MV E velocity: 77.60 cm/s  Estimated RAP:  3.00 mmHg MV A velocity: 86.50 cm/s  RVSP:           22.9 mmHg MV E/A ratio:  0.90  Soyla Merck MD Electronically signed by Soyla Merck MD Signature Date/Time: 04/22/2020/10:34:39 PM    Final   TEE  ECHO TEE 03/19/2019  Narrative TRANSESOPHOGEAL  ECHO REPORT    Patient Name:   Zachary Powers Date of Exam: 03/19/2019 Medical Rec #:  [id number]     Height:       69.0 in Accession #:    [id number]    Weight:       212.0 lb Date of Birth:  09-May-1966    BSA:          2.12 m Patient Age:    52 years      BP:           128/80 mmHg Patient Gender: M             HR:           65 bpm. Exam Location:  Inpatient   Procedure: Transesophageal Echo, Color Doppler and Saline Contrast Bubble Study  Indications:     Q21.1 Patent foramen Ovale  History:         Patient has prior history of Echocardiogram examinations, most recent 01/28/2019. Risk Factors: Current Smoker and Hypertension.  Sonographer:     Ellouise Mose RDCS Referring Phys:  [id number] MICHAEL COOPER Diagnosing Phys: Oneil Parchment MD    PROCEDURE: Consent was requested emergently by emergency room physicain. The transesophogeal probe was passed through the esophogus of the patient. The patient's vital signs; including heart rate, blood pressure, and oxygen saturation; remained stable throughout the procedure. The patient developed no complications during the procedure.  IMPRESSIONS   1. The left ventricle has normal systolic function, with an ejection fraction of 55-60%. The cavity size was normal. No evidence of left ventricular regional wall motion abnormalities. 2. The right ventricle has normal systolc  function. The cavity was normal. There is no increase in right ventricular wall thickness. 3. No evidence of a thrombus present in the left atrial appendage. 4. The aortic root is normal in size and structure. 5. Small amount bubble contrast cross over right to left consistent with small PFO. 6. No intracardiac thrombi or masses were visualized.  FINDINGS Left Ventricle: The left ventricle has normal systolic function, with an ejection fraction of 55-60%. The cavity size was normal. There is no increase in left ventricular wall thickness. No evidence of left ventricular regional wall motion abnormalities.  Right Ventricle: The right ventricle has normal systolic function. The cavity was normal. There is no increase in right ventricular wall thickness.  Left Atrium: Left atrial size was normal in size.   Left Atrial Appendage: No evidence of a thrombus present in the left atrial appendage.  Right Atrium: Right atrial size was normal in size. Right atrial pressure is estimated at 10 mmHg.  Interatrial Septum: No atrial level shunt detected by color flow Doppler. Agitated saline contrast was given intravenously to evaluate for intracardiac shunting. Saline contrast bubble study was positive, with evidence of interatrial shunting.  Pericardium: There is no evidence of pericardial effusion.  Mitral Valve: The mitral valve is normal in structure. Mitral valve regurgitation is mild by color flow Doppler.  Tricuspid Valve: The tricuspid valve was normal in structure. Tricuspid valve regurgitation was not visualized by color flow Doppler.  Aortic Valve: The aortic valve is normal in structure. Aortic valve regurgitation was not visualized by color flow Doppler. There is no evidence of aortic valve stenosis.  Pulmonic Valve: The pulmonic valve was normal in structure. Pulmonic valve regurgitation is not visualized by color flow Doppler.  Aorta: The aortic root is normal in size and  structure.  Venous: The inferior vena cava is normal  in size with greater than 50% respiratory variability.   Oneil Parchment MD Electronically signed by Oneil Parchment MD Signature Date/Time: 03/19/2019/2:55:07 PM    Final        ______________________________________________________________________________________________        Risk Assessment/Calculations   HYPERTENSION CONTROL Vitals:   01/30/24 1542 01/30/24 1543  BP: (!) 162/90 (!) 160/80    The patient's blood pressure is elevated above target today.  In order to address the patient's elevated BP: A new medication was prescribed today.          Physical Exam VS:  BP (!) 160/80   Pulse 81   Ht 5' 9 (1.753 m)   Wt 216 lb (98 kg)   SpO2 97%   BMI 31.90 kg/m        Wt Readings from Last 3 Encounters:  01/30/24 216 lb (98 kg)  03/23/22 214 lb (97.1 kg)  04/22/20 209 lb 3.2 oz (94.9 kg)    GEN: Well nourished, well developed in no acute distress. Sitting comfortably in the chair  NECK: No JVD CARDIAC: RRR, no murmurs, rubs, gallops. Radial pulses 2+ bilaterally  RESPIRATORY:  Clear to auscultation without rales, wheezing or rhonchi. Normal WOB on room air    ABDOMEN: Soft, non-tender, non-distended EXTREMITIES:  No edema in BLE; No deformity   ASSESSMENT AND PLAN  Shortness of breath -Patient referred for evaluation of shortness of breath.  On interview today, patient reports that he told a provider at [facility] wellness that he got short of breath when he exercised.  He has been trying to exercise more for his overall health.  Initially started walking, now runs on a treadmill.  Reports that if he runs on a treadmill for about 20 minutes he gets short of breath.  - Patient denies shortness of breath when not exercising.  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  No shortness of breath when walking up and down stairs - Patient denies orthopnea, cough, lower extremity swelling.   Lungs are clear on exam today. - Previous echocardiogram in 03/2020 showed EF 55 to 60%, normal RV systolic function, normal PA systolic pressure - No further workup needed at this time.  Encouraged patient to continue to increase physical activity as tolerated - Emphasized need for tobacco cessation   S/p PFO closure - PFO closed in 03/2019.  Echo in 03/2020 showed no residual flow, negative agitated saline contrast bubble study  Hyperlipidemia - Labs followed by PCP  - LDL 153 in 11/2023. He was started on lipitor  following this lab result  - continue Lipitor  80 mg daily  Hypertension - BP elevated today in clinic  - Start amlodipine 5 mg daily  - Continue hydrochlorothiazide  25 mg daily, losartan  100 mg daily - Labs followed by PCP   Dispo: Follow up with Dr. Wonda in 6 months   Signed, Rollo FABIENE Louder, PA-C

## 2024-01-30 ENCOUNTER — Ambulatory Visit: Attending: Cardiology | Admitting: Cardiology

## 2024-01-30 ENCOUNTER — Encounter: Payer: Self-pay | Admitting: Cardiology

## 2024-01-30 VITALS — BP 160/80 | HR 81 | Ht 69.0 in | Wt 216.0 lb

## 2024-01-30 DIAGNOSIS — R0602 Shortness of breath: Secondary | ICD-10-CM

## 2024-01-30 DIAGNOSIS — E782 Mixed hyperlipidemia: Secondary | ICD-10-CM | POA: Diagnosis not present

## 2024-01-30 DIAGNOSIS — I10 Essential (primary) hypertension: Secondary | ICD-10-CM

## 2024-01-30 MED ORDER — AMLODIPINE BESYLATE 5 MG PO TABS: 5.0000 mg | ORAL_TABLET | Freq: Every day | ORAL | 3 refills | Status: AC

## 2024-01-30 NOTE — Patient Instructions (Signed)
 Medication Instructions:  Start Amlodipine 5 mg once a day  *If you need a refill on your cardiac medications before your next appointment, please call your pharmacy*  Lab Work: No labs  Testing/Procedures: No testing  Follow-Up: At [facility], you and your health needs are our priority.  As part of our continuing mission to provide you with exceptional heart care, our providers are all part of one team.  This team includes your primary Cardiologist (physician) and Advanced Practice Providers or APPs (Physician Assistants and Nurse Practitioners) who all work together to provide you with the care you need, when you need it.  Your next appointment:   6 month(s)  Provider:   Ozell Fell, MD    We recommend signing up for the patient portal called MyChart.  Sign up information is provided on this After Visit Summary.  MyChart is used to connect with patients for Virtual Visits (Telemedicine).  Patients are able to view lab/test results, encounter notes, upcoming appointments, etc.  Non-urgent messages can be sent to your provider as well.   To learn more about what you can do with MyChart, go to [email or web address].

## 2024-02-16 ENCOUNTER — Other Ambulatory Visit: Payer: Self-pay

## 2024-02-16 ENCOUNTER — Encounter (HOSPITAL_COMMUNITY): Payer: Self-pay | Admitting: *Deleted

## 2024-02-16 ENCOUNTER — Ambulatory Visit (HOSPITAL_COMMUNITY)
Admission: EM | Admit: 2024-02-16 | Discharge: 2024-02-16 | Disposition: A | Discharge status: Home / Self Care | Attending: Emergency Medicine | Admitting: Emergency Medicine

## 2024-02-16 DIAGNOSIS — B349 Viral infection, unspecified: Secondary | ICD-10-CM | POA: Diagnosis not present

## 2024-02-16 DIAGNOSIS — R197 Diarrhea, unspecified: Secondary | ICD-10-CM

## 2024-02-16 LAB — POCT RAPID STREP A (OFFICE): Rapid Strep A Screen: NEGATIVE

## 2024-02-16 MED ORDER — DEXAMETHASONE SODIUM PHOSPHATE 10 MG/ML IJ SOLN
INTRAMUSCULAR | Status: AC
  Filled 2024-02-16: qty 1

## 2024-02-16 MED ORDER — DEXAMETHASONE SODIUM PHOSPHATE 10 MG/ML IJ SOLN
10.0000 mg | Freq: Once | INTRAMUSCULAR | Status: AC
  Administered 2024-02-16: 10 mg via INTRAMUSCULAR

## 2024-02-16 MED ORDER — IBUPROFEN 800 MG PO TABS: 800.0000 mg | ORAL_TABLET | Freq: Three times a day (TID) | ORAL | 0 refills | Status: AC

## 2024-02-16 NOTE — ED Triage Notes (Signed)
 PT reports 2 day sago he developed a sore throat with body aches and chills.

## 2024-02-16 NOTE — Discharge Instructions (Signed)
 Your symptoms appear to be related to a viral illness.  You can alternate between 800 mg of ibuprofen  and 500 mg of Tylenol  every 4-6 hours to help with fever, body aches and chills.  Follow a bland diet to help with the diarrhea and ensure you are staying well-hydrated with at least 64 ounces of water daily.   Symptoms should improve over the next 5 to 7 days, if no improvement please return to clinic for reevaluation.

## 2024-02-16 NOTE — ED Provider Notes (Signed)
 [facility]    CSN: [id number] Arrival date & time: 02/16/24  1019      History   Chief Complaint Chief Complaint  Patient presents with   Chills   Sore Throat   Generalized Body Aches    HPI WOODLEY PETZOLD is a 58 y.o. male.   Patient presents to clinic over concern of sore throat, chills, generalized body aches, diarrhea and feeling unwell for the past two days. Has been taking Tylenol  as needed. Diarrhea, no nausea or emesis. Denies abdominal pain. Normal appetite.  No recent sick contacts, no recent diet changes or travel.  Has not had blood in stool.  The history is provided by the patient and medical records.  Sore Throat    Past Medical History:  Diagnosis Date   Anxiety    Asthma    Back pain    Cryptogenic stroke (HCC)    Depression    GERD (gastroesophageal reflux disease)    Hypertension    PFO (patent foramen ovale)     Patient Active Problem List   Diagnosis Date Noted   PFO (patent foramen ovale) 03/19/2019   Acute CVA (cerebrovascular accident) (HCC) 01/28/2019   Tobacco use 01/28/2019   HTN (hypertension) 01/28/2019   Tendon rupture, traumatic, patella 04/10/2018    Past Surgical History:  Procedure Laterality Date   BUBBLE STUDY  03/19/2019   Procedure: BUBBLE STUDY;  Surgeon: Jeffrie Oneil BROCKS, MD;  Location: [facility];  Service: Cardiovascular;;   HAND SURGERY Left 1991   KNEE ARTHROSCOPY Left 04/10/2018   Procedure: Left knee arthroscopy, debridement, chondroplasty patella, possible menisectomy;  Surgeon: Duwayne Purchase, MD;  Location: [facility];  Service: Orthopedics;  Laterality: Left;  2 hrs for both procedures   PATENT FORAMEN OVALE(PFO) CLOSURE N/A 03/19/2019   Procedure: PATENT FORAMEN OVALE (PFO) CLOSURE;  Surgeon: Wonda Sharper, MD;  Location: [facility] INVASIVE CV LAB;  Service: Cardiovascular;  Laterality: N/A;   QUADRICEPS TENDON REPAIR Left 04/10/2018   Procedure: Mini open quad tendon repair;  Surgeon: Duwayne Purchase, MD;   Location: [facility];  Service: Orthopedics;  Laterality: Left;   TEE WITHOUT CARDIOVERSION N/A 03/19/2019   Procedure: TRANSESOPHAGEAL ECHOCARDIOGRAM (TEE);  Surgeon: Jeffrie Oneil BROCKS, MD;  Location: [facility] ENDOSCOPY;  Service: Cardiovascular;  Laterality: N/A;       Home Medications    Prior to Admission medications   Medication Sig Start Date End Date Taking? Authorizing Provider  amLODipine  (NORVASC ) 5 MG tablet Take 1 tablet (5 mg total) by mouth daily. 01/30/24 04/29/24 Yes Vicci Rollo SAUNDERS, PA-C  aspirin  EC 81 MG tablet Take 81 mg by mouth once. 08/25/21  Yes [provider]  Cholecalciferol 1.25 MG (50000 UT) TABS 1 capsule.   Yes [provider]  famotidine  (PEPCID ) 20 MG tablet Take 20 mg by mouth as needed. 05/25/22  Yes [provider]  hydrochlorothiazide  (HYDRODIURIL ) 25 MG tablet Take 25 mg by mouth daily.   Yes [provider]  ibuprofen  (ADVIL ) 800 MG tablet Take 1 tablet (800 mg total) by mouth 3 (three) times daily. 02/16/24  Yes Nazario Russom  N, FNP  LINZESS 145 MCG CAPS capsule Take 145 mcg by mouth every morning. 03/11/20  Yes [provider]  losartan  (COZAAR ) 100 MG tablet Take 100 mg by mouth daily.   Yes [provider]  Multiple Vitamin (MULTIVITAMIN WITH MINERALS) TABS tablet Take 1 tablet by mouth daily.   Yes [provider]  naproxen  (NAPROSYN ) 500 MG tablet Take 1  tablet (500 mg total) by mouth 2 (two) times daily as needed. 03/21/22  Yes Stuart Vernell Norris, PA-C  NICOTINE  STEP 1 21 MG/24HR patch Place 1 patch (21 mg total) onto the skin daily. 07/19/20  Yes Sebastian Lamarr SAUNDERS, PA-C  predniSONE  (DELTASONE ) 50 MG tablet Take 1 tablet (50 mg total) by mouth daily. 11/13/23  Yes Raford Lenis, MD  PROAIR  HFA 108 [phone] Base) MCG/ACT inhaler Inhale 2 puffs into the lungs 2 (two) times daily as needed. 11/13/23  Yes Raford Lenis, MD  sildenafil (VIAGRA) 100 MG tablet Take 100 mg by mouth daily.   Yes [provider]  tiZANidine  (ZANAFLEX ) 4 MG tablet Take 1 tablet (4 mg total) by mouth every 8 (eight) hours as needed for muscle spasms (or muscle pain). 04/03/22  Yes Stuart Vernell Norris, PA-C  zolpidem  (AMBIEN ) 10 MG tablet Take 10 mg by mouth at bedtime as needed for sleep.   Yes [provider]  atorvastatin  (LIPITOR ) 80 MG tablet Take 1 tablet (80 mg total) by mouth daily at 6 PM. 01/29/19 01/30/24  Cindy Garnette POUR, MD    Family History Family History  Problem Relation Age of Onset   Diabetes Father    Heart disease Father    Diabetes Paternal Grandmother    Stroke Mother    Colon cancer Neg Hx     Social History Social History   Tobacco Use   Smoking status: Every Day    Current packs/day: 0.50    Types: Cigarettes   Smokeless tobacco: Never  Vaping Use   Vaping status: Never Used  Substance Use Topics   Alcohol use: No    Alcohol/week: 0.0 standard drinks of alcohol   Drug use: No    Comment: no use in 6 years     Allergies   Barbiturates   Review of Systems Review of Systems  Per HPI  Physical Exam Triage Vital Signs ED Triage Vitals  Encounter Vitals Group     BP 02/16/24 1031 127/80     Girls Systolic BP Percentile --      Girls Diastolic BP Percentile --      Boys Systolic BP Percentile --      Boys Diastolic BP Percentile --      Pulse Rate 02/16/24 1031 (!) 111     Resp 02/16/24 1031 20     Temp 02/16/24 1031 99.2 F (37.3 C)     Temp src --      SpO2 02/16/24 1031 96 %     Weight --      Height --      Head Circumference --      Peak Flow --      Pain Score 02/16/24 1029 10     Pain Loc --      Pain Education --      Exclude from Growth Chart --    No data found.  Updated Vital Signs BP 127/80   Pulse (!) 111   Temp 99.2 F (37.3 C)   Resp 20   SpO2 96%   Visual Acuity Right Eye Distance:   Left Eye Distance:   Bilateral Distance:    Right Eye Near:   Left Eye Near:    Bilateral Near:     Physical Exam Vitals  and nursing note reviewed.  Constitutional:      Appearance: Normal appearance. He is well-developed.  HENT:     Head: Normocephalic and atraumatic.     Right  Ear: External ear normal.     Left Ear: External ear normal.     Nose: No congestion or rhinorrhea.     Mouth/Throat:     Mouth: Mucous membranes are moist.     Pharynx: Uvula midline. Posterior oropharyngeal erythema present.     Tonsils: No tonsillar exudate or tonsillar abscesses.  Cardiovascular:     Rate and Rhythm: Regular rhythm. Tachycardia present.     Heart sounds: Normal heart sounds. No murmur heard. Pulmonary:     Effort: Pulmonary effort is normal.     Breath sounds: Normal breath sounds.  Skin:    General: Skin is warm and dry.  Neurological:     General: No focal deficit present.     Mental Status: He is alert and oriented to person, place, and time.  Psychiatric:        Mood and Affect: Mood normal.        Behavior: Behavior normal. Behavior is cooperative.      UC Treatments / Results  Labs (all labs ordered are listed, but only abnormal results are displayed) Labs Reviewed  POCT RAPID STREP A (OFFICE)    EKG   Radiology No results found.  Procedures Procedures (including critical care time)  Medications Ordered in UC Medications  dexamethasone  (DECADRON ) injection 10 mg (has no administration in time range)    Initial Impression / Assessment and Plan / UC Course  I have reviewed the triage vital signs and the nursing notes.  Pertinent labs & imaging results that were available during my care of the patient were reviewed by me and considered in my medical decision making (see chart for details).  Vitals in triage reviewed, patient is hemodynamically stable.  Lungs vesicular, heart with mild tachycardia, regular rate and rhythm.  Cervical LAD.  Posterior pharynx erythema.  POC rapid strep negative.  Combined with generalized bodyaches, chills and diarrhea, suspect viral etiology due to  symptoms.  IM steroid for sore throat and LAD.  Symptomatic management discussed.  Plan of care, follow-up care return precautions given, no questions at this time.    Final Clinical Impressions(s) / UC Diagnoses   Final diagnoses:  Viral illness  Diarrhea, unspecified type     Discharge Instructions      Your symptoms appear to be related to a viral illness.  You can alternate between 800 mg of ibuprofen  and 500 mg of Tylenol  every 4-6 hours to help with fever, body aches and chills.  Follow a bland diet to help with the diarrhea and ensure you are staying well-hydrated with at least 64 ounces of water daily.   Symptoms should improve over the next 5 to 7 days, if no improvement please return to clinic for reevaluation.    ED Prescriptions     Medication Sig Dispense Auth. Provider   ibuprofen  (ADVIL ) 800 MG tablet Take 1 tablet (800 mg total) by mouth 3 (three) times daily. 30 tablet Dreama, [place] Espina  N, FNP      PDMP not reviewed this encounter.   Dreama Juergen Hardenbrook  N, FNP 02/16/24 1118

## 2024-06-10 ENCOUNTER — Telehealth: Payer: Self-pay | Admitting: Cardiovascular Disease

## 2024-06-10 NOTE — Telephone Encounter (Signed)
 Caller (Abby - Dr. Julaine office) wants to get a copy of patient's last office visit notes faxed to fax# [phone].

## 2024-10-13 ENCOUNTER — Ambulatory Visit: Admitting: Neurology
